# Patient Record
Sex: Male | Born: 1975
Health system: Southern US, Community
[De-identification: ages and names within clinical notes are randomized; demographics above are authoritative.]

## PROBLEM LIST (undated history)

## (undated) DIAGNOSIS — K219 Gastro-esophageal reflux disease without esophagitis: Secondary | ICD-10-CM

## (undated) DIAGNOSIS — I10 Essential (primary) hypertension: Secondary | ICD-10-CM

## (undated) DIAGNOSIS — T7840XA Allergy, unspecified, initial encounter: Secondary | ICD-10-CM

## (undated) DIAGNOSIS — K56609 Unspecified intestinal obstruction, unspecified as to partial versus complete obstruction: Secondary | ICD-10-CM

## (undated) DIAGNOSIS — F419 Anxiety disorder, unspecified: Secondary | ICD-10-CM

## (undated) DIAGNOSIS — K5720 Diverticulitis of large intestine with perforation and abscess without bleeding: Secondary | ICD-10-CM

## (undated) DIAGNOSIS — J45909 Unspecified asthma, uncomplicated: Secondary | ICD-10-CM

## (undated) HISTORY — DX: Essential (primary) hypertension: I10

## (undated) HISTORY — DX: Anxiety disorder, unspecified: F41.9

## (undated) HISTORY — DX: Diverticulitis of large intestine with perforation and abscess without bleeding: K57.20

## (undated) HISTORY — DX: Allergy, unspecified, initial encounter: T78.40XA

## (undated) HISTORY — PX: NO PAST SURGERIES: SHX2092

## (undated) HISTORY — DX: Gastro-esophageal reflux disease without esophagitis: K21.9

---

## 2010-07-03 ENCOUNTER — Emergency Department (HOSPITAL_BASED_OUTPATIENT_CLINIC_OR_DEPARTMENT_OTHER): Admission: EM | Admit: 2010-07-03 | Discharge: 2010-07-03 | Payer: Self-pay | Admitting: Emergency Medicine

## 2010-07-03 ENCOUNTER — Emergency Department (HOSPITAL_COMMUNITY): Admission: EM | Admit: 2010-07-03 | Discharge: 2010-07-03 | Payer: Self-pay | Admitting: Emergency Medicine

## 2010-07-03 ENCOUNTER — Ambulatory Visit: Payer: Self-pay | Admitting: Diagnostic Radiology

## 2012-12-13 ENCOUNTER — Emergency Department (HOSPITAL_BASED_OUTPATIENT_CLINIC_OR_DEPARTMENT_OTHER): Payer: BC Managed Care – PPO

## 2012-12-13 ENCOUNTER — Emergency Department (HOSPITAL_BASED_OUTPATIENT_CLINIC_OR_DEPARTMENT_OTHER)
Admission: EM | Admit: 2012-12-13 | Discharge: 2012-12-14 | Disposition: A | Discharge status: Home / Self Care | Payer: BC Managed Care – PPO | Attending: Emergency Medicine | Admitting: Emergency Medicine

## 2012-12-13 ENCOUNTER — Encounter (HOSPITAL_BASED_OUTPATIENT_CLINIC_OR_DEPARTMENT_OTHER): Payer: Self-pay | Admitting: *Deleted

## 2012-12-13 DIAGNOSIS — S0280XA Fracture of other specified skull and facial bones, unspecified side, initial encounter for closed fracture: Secondary | ICD-10-CM | POA: Insufficient documentation

## 2012-12-13 DIAGNOSIS — IMO0002 Reserved for concepts with insufficient information to code with codable children: Secondary | ICD-10-CM | POA: Insufficient documentation

## 2012-12-13 DIAGNOSIS — H1130 Conjunctival hemorrhage, unspecified eye: Secondary | ICD-10-CM | POA: Insufficient documentation

## 2012-12-13 DIAGNOSIS — S0285XA Fracture of orbit, unspecified, initial encounter for closed fracture: Secondary | ICD-10-CM

## 2012-12-13 DIAGNOSIS — S058X9A Other injuries of unspecified eye and orbit, initial encounter: Secondary | ICD-10-CM | POA: Insufficient documentation

## 2012-12-13 DIAGNOSIS — Y939 Activity, unspecified: Secondary | ICD-10-CM | POA: Insufficient documentation

## 2012-12-13 DIAGNOSIS — Y92009 Unspecified place in unspecified non-institutional (private) residence as the place of occurrence of the external cause: Secondary | ICD-10-CM | POA: Insufficient documentation

## 2012-12-13 MED ORDER — OXYCODONE-ACETAMINOPHEN 5-325 MG PO TABS
1.0000 | ORAL_TABLET | Freq: Once | ORAL | Status: AC
  Administered 2012-12-13: 1 via ORAL
  Filled 2012-12-13 (×2): qty 1

## 2012-12-13 MED ORDER — FLUORESCEIN SODIUM 1 MG OP STRP
1.0000 | ORAL_STRIP | Freq: Once | OPHTHALMIC | Status: AC
  Administered 2012-12-13: via OPHTHALMIC
  Filled 2012-12-13: qty 1
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
## 2012-12-13 NOTE — ED Notes (Signed)
Patient is ready for dermabond, irrigation completed.

## 2012-12-14 MED ORDER — OXYCODONE-ACETAMINOPHEN 5-325 MG PO TABS: 1.0000 | ORAL_TABLET | Freq: Four times a day (QID) | ORAL | Status: DC | PRN

## 2012-12-14 MED ORDER — CLINDAMYCIN HCL 300 MG PO CAPS: 300.0000 mg | ORAL_CAPSULE | Freq: Three times a day (TID) | ORAL | Status: DC

## 2012-12-14 MED ORDER — CLINDAMYCIN HCL 150 MG PO CAPS
300.0000 mg | ORAL_CAPSULE | Freq: Once | ORAL | Status: AC
  Administered 2012-12-14: 300 mg via ORAL
  Filled 2012-12-14: qty 2

## 2012-12-14 NOTE — ED Notes (Signed)
MD at bedside. 

## 2012-12-14 NOTE — ED Notes (Signed)
Returned from CT.

## 2014-01-28 ENCOUNTER — Emergency Department (HOSPITAL_BASED_OUTPATIENT_CLINIC_OR_DEPARTMENT_OTHER)
Admission: EM | Admit: 2014-01-28 | Discharge: 2014-01-28 | Disposition: A | Discharge status: Home / Self Care | Payer: BC Managed Care – PPO | Attending: Emergency Medicine | Admitting: Emergency Medicine

## 2014-01-28 ENCOUNTER — Encounter (HOSPITAL_BASED_OUTPATIENT_CLINIC_OR_DEPARTMENT_OTHER): Payer: Self-pay | Admitting: Emergency Medicine

## 2014-01-28 DIAGNOSIS — R000 Tachycardia, unspecified: Secondary | ICD-10-CM | POA: Insufficient documentation

## 2014-01-28 DIAGNOSIS — M436 Torticollis: Secondary | ICD-10-CM | POA: Insufficient documentation

## 2014-01-28 DIAGNOSIS — R110 Nausea: Secondary | ICD-10-CM | POA: Insufficient documentation

## 2014-01-28 DIAGNOSIS — Z792 Long term (current) use of antibiotics: Secondary | ICD-10-CM | POA: Insufficient documentation

## 2014-01-28 MED ORDER — DEXAMETHASONE 4 MG PO TABS
10.0000 mg | ORAL_TABLET | Freq: Once | ORAL | Status: AC
  Administered 2014-01-28: 10 mg via ORAL

## 2014-01-28 MED ORDER — IBUPROFEN 800 MG PO TABS: 800.0000 mg | ORAL_TABLET | Freq: Three times a day (TID) | ORAL | Status: AC

## 2014-01-28 MED ORDER — DIAZEPAM 5 MG PO TABS
5.0000 mg | ORAL_TABLET | Freq: Once | ORAL | Status: AC
  Administered 2014-01-28: 5 mg via ORAL
  Filled 2014-01-28: qty 1

## 2014-01-28 MED ORDER — HYDROCODONE-ACETAMINOPHEN 5-325 MG PO TABS: 1.0000 | ORAL_TABLET | Freq: Four times a day (QID) | ORAL | Status: DC | PRN

## 2014-01-28 MED ORDER — HYDROCODONE-ACETAMINOPHEN 5-325 MG PO TABS
1.0000 | ORAL_TABLET | Freq: Once | ORAL | Status: AC
  Administered 2014-01-28: 1 via ORAL
  Filled 2014-01-28: qty 1

## 2014-01-28 MED ORDER — DIAZEPAM 5 MG PO TABS: 5.0000 mg | ORAL_TABLET | Freq: Two times a day (BID) | ORAL | Status: DC

## 2014-01-28 MED ORDER — DEXAMETHASONE 4 MG PO TABS
ORAL_TABLET | ORAL | Status: AC
  Filled 2014-01-28: qty 3

## 2014-01-28 NOTE — ED Provider Notes (Signed)
CSN: [id number]     Arrival date & time 01/28/14  0814 History   First MD Initiated Contact with Patient 01/28/14 [phone]     Chief Complaint  Patient presents with  . Torticollis      HPI  Patient presents with concern of pain in the right lateral inferior neck.  Pain began 3 days ago without clear precipitant.  Since onset the pain has been severe, sore, not improved with ibuprofen.  Pain radiates down the right arm, is worse with right lateral head rotation. There is no associated headache, confusion, disorientation, chest pain dyspnea, abdominal pain. Patient does endorse nausea with pain exacerbations.  Patient has no history of trauma, fall.   History reviewed. No pertinent past medical history. History reviewed. No pertinent past surgical history. No family history on file. History  Substance Use Topics  . Smoking status: Never Smoker   . Smokeless tobacco: Not on file  . Alcohol Use: No    Review of Systems  All other systems reviewed and are negative.      Allergies  Review of patient's allergies indicates no known allergies.  Home Medications   Current Outpatient Rx  Name  Route  Sig  Dispense  Refill  . clindamycin (CLEOCIN) 300 MG capsule   Oral   Take 1 capsule (300 mg total) by mouth 3 (three) times daily.   21 capsule   0   . diazepam (VALIUM) 5 MG tablet   Oral   Take 1 tablet (5 mg total) by mouth 2 (two) times daily.   8 tablet   0   . HYDROcodone-acetaminophen (NORCO/VICODIN) 5-325 MG per tablet   Oral   Take 1-2 tablets by mouth every 6 (six) hours as needed for moderate pain.   15 tablet   0   . ibuprofen (ADVIL,MOTRIN) 800 MG tablet   Oral   Take 1 tablet (800 mg total) by mouth 3 (three) times daily.   12 tablet   0   . oxyCODONE-acetaminophen (PERCOCET/ROXICET) 5-325 MG per tablet   Oral   Take 1-2 tablets by mouth every 6 (six) hours as needed for pain.   15 tablet   0    BP 156/101  Pulse 112  Temp(Src) 98.3 F (36.8 C)  (Oral)  Resp 18  Ht 5\' 11"  (1.803 m)  Wt 280 lb (127.007 kg)  BMI 39.07 kg/m2  SpO2 98% Physical Exam  Nursing note and vitals reviewed. Constitutional: He is oriented to person, place, and time. He appears well-developed. No distress.  HENT:  Head: Normocephalic and atraumatic.  Eyes: Conjunctivae and EOM are normal.  Neck:    Cardiovascular: Normal rate and regular rhythm.   Pulmonary/Chest: Effort normal. No stridor. No respiratory distress.  Abdominal: He exhibits no distension.  Musculoskeletal: He exhibits no edema.       Right shoulder: Normal.       Right elbow: Normal.      Right wrist: Normal.  Neurological: He is alert and oriented to person, place, and time. He displays no atrophy and no tremor. No cranial nerve deficit or sensory deficit. He exhibits normal muscle tone. He displays no seizure activity. Coordination and gait normal.  Skin: Skin is warm and dry.  Psychiatric: He has a normal mood and affect.    ED Course  Procedures (including critical care time)  MDM   Final diagnoses:  Torticollis, acute    Patient presents with likely acute radiculopathy.  Patient is mildly tachycardic, but hemodynamically  stable, neurologically intact, and in no distress.  Patient has no risk factors, no notable red flags on exam.  Patient to start him a course of anti-inflammatories, muscle relaxants and discharged in stable condition.    Gerhard Munchobert Woodward Klem, MD 01/28/14 [phone]

## 2014-01-28 NOTE — Discharge Instructions (Signed)
As discussed, your emergency department visit has resulted in a diagnosis of torticollis.  Your pain is likely coming from inflammation of the area near the base of your cervical spine, or neck.  This pain typically improves in a few days with appropriate pain medication use.  If you develop new, or concerning changes in your condition, please be sure return here.

## 2014-01-28 NOTE — ED Notes (Signed)
Neck pain radiating to right shoulder x 4 days without injury.  Symptoms unrelieved after taking Ibuprofen.

## 2014-02-04 ENCOUNTER — Encounter: Payer: Self-pay | Admitting: Family Medicine

## 2014-02-04 ENCOUNTER — Ambulatory Visit (INDEPENDENT_AMBULATORY_CARE_PROVIDER_SITE_OTHER): Payer: BC Managed Care – PPO | Admitting: Family Medicine

## 2014-02-04 VITALS — BP 149/98 | HR 92 | Ht 71.0 in | Wt 269.4 lb

## 2014-02-04 DIAGNOSIS — M542 Cervicalgia: Secondary | ICD-10-CM

## 2014-02-04 DIAGNOSIS — M25519 Pain in unspecified shoulder: Secondary | ICD-10-CM

## 2014-02-04 DIAGNOSIS — M25511 Pain in right shoulder: Secondary | ICD-10-CM

## 2014-02-04 MED ORDER — DIAZEPAM 5 MG PO TABS: 5.0000 mg | ORAL_TABLET | Freq: Three times a day (TID) | ORAL | Status: DC | PRN

## 2014-02-04 MED ORDER — HYDROCODONE-ACETAMINOPHEN 7.5-325 MG PO TABS: ORAL_TABLET | ORAL | Status: DC

## 2014-02-04 MED ORDER — PREDNISONE (PAK) 10 MG PO TABS: ORAL_TABLET | Freq: Every day | ORAL | Status: DC

## 2014-02-04 MED ORDER — ONDANSETRON HCL 4 MG PO TABS: 4.0000 mg | ORAL_TABLET | Freq: Three times a day (TID) | ORAL | Status: DC | PRN

## 2014-02-04 NOTE — Patient Instructions (Signed)
You have severe muscle spasms/strain of your right trapezius. It's possible this is from an irritated nerve though treatment is similar for this. Prednisone 6 day dose pack to relieve irritation/inflammation. Aleve 2 tabs twice a day with food for pain and inflammation - start day AFTER finishing prednisone if prescribed this. Valium as needed for severe spasms (no driving on this).   Norco as needed for severe pain (no driving on this medicine). Zofran as needed for nausea. Consider cervical collar if severely painful. Simple range of motion exercises within limits of pain to prevent further stiffness. Start physical therapy for stretching, exercises, traction, and modalities. Heat or ice (whichever feels better) 15 minutes at a time 3-4 times a day to help with spasms. Watch head position when on computers, texting, when sleeping in bed - should in line with back to prevent further irritation. Consider home traction unit if you get benefit with this in physical therapy. If not improving we will consider an MRI. Call me in a week if you're not improving.

## 2014-02-08 ENCOUNTER — Encounter: Payer: Self-pay | Admitting: Family Medicine

## 2014-02-08 DIAGNOSIS — M542 Cervicalgia: Secondary | ICD-10-CM | POA: Insufficient documentation

## 2014-02-08 NOTE — Progress Notes (Signed)
Patient ID: Alice Riegereter Devries, male   DOB: 08/09/1976, 38 y.o.   MRN: [id number]  PCP: No PCP Per Patient  Subjective:   HPI: Patient is a 38 y.o. male here for right shoulder/neck pain.  Patient denies known injury. States about 2 weeks ago he just developed right sided neck pain that has worsened since then. Associated with nausea when pain is severe. Worse with turning to right side. No radiation. Tried ibuprofen, oxycodone, valium. Has been icing also. No bowel/bladder dysfunction.  No past medical history on file.  No current outpatient prescriptions on file prior to visit.   No current facility-administered medications on file prior to visit.    No past surgical history on file.  No Known Allergies  History   Social History  . Marital Status: Married    Spouse Name: N/A    Number of Children: N/A  . Years of Education: N/A   Occupational History  . Not on file.   Social History Main Topics  . Smoking status: Never Smoker   . Smokeless tobacco: Not on file  . Alcohol Use: No  . Drug Use: Yes    Special: Marijuana     Comment: occasional  . Sexual Activity: Not on file   Other Topics Concern  . Not on file   Social History Narrative  . No narrative on file    No family history on file.  BP 149/98  Pulse 92  Ht 5\' 11"  (1.803 m)  Wt 269 lb 6.4 oz (122.199 kg)  BMI 37.59 kg/m2  Review of Systems: See HPI above.    Objective:  Physical Exam:  Gen: NAD  Neck: No gross deformity, swelling, bruising.  Spasms right trapezius. TTP right cervical parsapinal muscles, trapezius.  No midline/bony TTP. FROM neck - pain on right lateral rotation, extension. BUE strength 5/5.   Sensation intact to light touch.   2+ equal reflexes in triceps, biceps, brachioradialis tendons. Negative spurlings. NV intact distal BUEs.    Assessment & Plan:  1. Right neck pain - severe paraspinal, trapezius spasms.  Possibly due to cervical nerve irritation though rest of  exam benign.  Start with prednisone, valium, norco with zofran as needed for nausea.  Consider collar.  Start physical therapy.  Ergonomic issues discussed.  Consider MRI if not improving.

## 2014-02-08 NOTE — Assessment & Plan Note (Signed)
severe paraspinal, trapezius spasms.  Possibly due to cervical nerve irritation though rest of exam benign.  Start with prednisone, valium, norco with zofran as needed for nausea.  Consider collar.  Start physical therapy.  Ergonomic issues discussed.  Consider MRI if not improving.

## 2014-07-19 ENCOUNTER — Ambulatory Visit (INDEPENDENT_AMBULATORY_CARE_PROVIDER_SITE_OTHER): Payer: BC Managed Care – PPO | Admitting: Family Medicine

## 2014-07-19 ENCOUNTER — Encounter: Payer: Self-pay | Admitting: Family Medicine

## 2014-07-19 VITALS — BP 148/92 | HR 91 | Ht 71.0 in | Wt 275.0 lb

## 2014-07-19 DIAGNOSIS — M542 Cervicalgia: Secondary | ICD-10-CM

## 2014-07-19 MED ORDER — PREDNISONE (PAK) 10 MG PO TABS: ORAL_TABLET | Freq: Every day | ORAL | Status: DC

## 2014-07-19 MED ORDER — DIAZEPAM 5 MG PO TABS: 5.0000 mg | ORAL_TABLET | Freq: Three times a day (TID) | ORAL | Status: DC | PRN

## 2014-07-19 MED ORDER — HYDROCODONE-ACETAMINOPHEN 7.5-325 MG PO TABS: ORAL_TABLET | ORAL | Status: DC

## 2014-07-19 NOTE — Patient Instructions (Signed)
You have severe muscle spasms/strain of your right trapezius. It's possible this is from an irritated nerve though treatment is similar for this. Prednisone 6 day dose pack to relieve irritation/inflammation. Aleve 2 tabs twice a day with food for pain and inflammation - start day AFTER finishing prednisone. Valium as needed for severe spasms (no driving on this).   Norco as needed for severe pain (no driving on this medicine). Zofran as needed for nausea. Consider cervical collar if severely painful. Simple range of motion exercises within limits of pain to prevent further stiffness. Call me in 1-2 weeks if you're not completely better and want to do physical therapy.   Heat or ice (whichever feels better) 15 minutes at a time 3-4 times a day to help with spasms. Watch head position when on computers, texting, when sleeping in bed - should in line with back to prevent further irritation. If not improving we will consider an MRI. Call me in a week if you're not improving otherwise follow up as needed.

## 2014-07-20 ENCOUNTER — Encounter: Payer: Self-pay | Admitting: Family Medicine

## 2014-07-20 NOTE — Assessment & Plan Note (Signed)
severe paraspinal, trapezius spasms similar to back in March.  Possibly due to cervical nerve irritation though rest of exam benign.  Did well with prednisone, valium, norco with zofran as needed for nausea last time - will repeat this.  Call us if he wants to do physical therapy.  If not improving at all would consider cervical spine MRI.  Call us in 1 week with his status.

## 2014-07-20 NOTE — Progress Notes (Signed)
Patient ID: Paul Gilmore, male   DOB: [birth year], 38 y.o.   MRN: [id number]  PCP: No PCP Per Patient  Subjective:   HPI: Patient is a 38 y.o. male here for right shoulder/neck pain.  3/12: Patient denies known injury. States about 2 weeks ago he just developed right sided neck pain that has worsened since then. Associated with nausea when pain is severe. Worse with turning to right side. No radiation. Tried ibuprofen, oxycodone, valium. Has been icing also. No bowel/bladder dysfunction.  8/24: Patient reports he was working in the yard about 1 week ago moving tree limbs. No known injury though felt like he had a pulled muscle especially by the next morning. Pain in same area right side of neck. Has to support elbow to get comfortable. Improved completely following treatment in March and this feels similar (prednisone, valium, norco). Did not do PT since he improved. Pain worse looking to the left. No bowel/bladder dysfunction. No numbness/tingling.  History reviewed. No pertinent past medical history.  Current Outpatient Prescriptions on File Prior to Visit  Medication Sig Dispense Refill  . ondansetron (ZOFRAN) 4 MG tablet Take 1 tablet (4 mg total) by mouth every 8 (eight) hours as needed for nausea or vomiting.  40 tablet  0   No current facility-administered medications on file prior to visit.    History reviewed. No pertinent past surgical history.  No Known Allergies  History   Social History  . Marital Status: Married    Spouse Name: N/A    Number of Children: N/A  . Years of Education: N/A   Occupational History  . Not on file.   Social History Main Topics  . Smoking status: Never Smoker   . Smokeless tobacco: Not on file  . Alcohol Use: No  . Drug Use: Yes    Special: Marijuana     Comment: occasional  . Sexual Activity: Not on file   Other Topics Concern  . Not on file   Social History Narrative  . No narrative on file    No family history  on file.  BP 148/92  Pulse 91  Ht  (1.803 m)  Wt 275 lb (124.739 kg)  BMI 38.37 kg/m2  Review of Systems: See HPI above.    Objective:  Physical Exam:  Gen: NAD  Neck: No gross deformity, swelling, bruising.  Spasms right trapezius. TTP right cervical parsapinal muscles, trapezius.  No midline/bony TTP. FROM neck - pain on left lateral rotation. BUE strength 5/5.   Sensation intact to light touch.   2+ equal reflexes in triceps, biceps, brachioradialis tendons. Negative spurlings. NV intact distal BUEs.    Assessment & Plan:  1. Right neck pain - severe paraspinal, trapezius spasms similar to back in March.  Possibly due to cervical nerve irritation though rest of exam benign.  Did well with prednisone, valium, norco with zofran as needed for nausea last time - will repeat this.  Call us if he wants to do physical therapy.  If not improving at all would consider cervical spine MRI.  Call us in 1 week with his status.

## 2014-11-30 ENCOUNTER — Encounter: Payer: Self-pay | Admitting: Family Medicine

## 2014-11-30 ENCOUNTER — Ambulatory Visit (INDEPENDENT_AMBULATORY_CARE_PROVIDER_SITE_OTHER): Payer: BLUE CROSS/BLUE SHIELD | Admitting: Family Medicine

## 2014-11-30 VITALS — BP 170/99 | HR 92 | Ht 71.0 in | Wt 270.0 lb

## 2014-11-30 DIAGNOSIS — M542 Cervicalgia: Secondary | ICD-10-CM

## 2014-11-30 MED ORDER — HYDROCODONE-ACETAMINOPHEN 7.5-325 MG PO TABS: ORAL_TABLET | ORAL | Status: DC

## 2014-11-30 MED ORDER — DIAZEPAM 5 MG PO TABS: 5.0000 mg | ORAL_TABLET | Freq: Three times a day (TID) | ORAL | Status: DC | PRN

## 2014-11-30 NOTE — Patient Instructions (Signed)
You have severe muscle spasms/strain of your right trapezius. Aleve 2 tabs twice a day with food for pain and inflammation Valium as needed for severe spasms (no driving on this).   Norco as needed for severe pain (no driving on this medicine). Simple range of motion exercises within limits of pain to prevent further stiffness. Call me in 1-2 weeks if not improving and would call in the prednisone.  Heat or ice (whichever feels better) 15 minutes at a time 3-4 times a day to help with spasms. Watch head position when on computers, texting, when sleeping in bed - should in line with back to prevent further spasms. Call me in a week if you're not improving otherwise follow up as needed.

## 2014-12-01 NOTE — Assessment & Plan Note (Signed)
severe paraspinal, trapezius spasms similar to last visit.  Will try to treat without prednisone dose pack this time - take regular nsaids.  Valium and norco as needed.  Call us if over next week he's not improving and would call in prednisone dose pack.  Consider physical therapy as well.

## 2014-12-01 NOTE — Progress Notes (Signed)
Patient ID: Paul Gilmore, male   DOB: 02/20/1976, 39 y.o.   MRN: [id number]  PCP: No PCP Per Patient  Subjective:   HPI: Patient is a 39 y.o. male here for right shoulder/neck pain.  3/12: Patient denies known injury. States about 2 weeks ago he just developed right sided neck pain that has worsened since then. Associated with nausea when pain is severe. Worse with turning to right side. No radiation. Tried ibuprofen, oxycodone, valium. Has been icing also. No bowel/bladder dysfunction.  07/19/14: Patient reports he was working in the yard about 1 week ago moving tree limbs. No known injury though felt like he had a pulled muscle especially by the next morning. Pain in same area right side of neck. Has to support elbow to get comfortable. Improved completely following treatment in March and this feels similar (prednisone, valium, norco). Did not do PT since he improved. Pain worse looking to the left. No bowel/bladder dysfunction. No numbness/tingling.  11/30/14: Patient reports similar issue to last visit. Was working a lot in the yard about a week ago and that day and the next started to develop pain in right side of upper back, neck. No numbness or tingling. No bowel/bladder dysfunction. No radiation into his arm. Again completely improved after last visit with prednisone, valium, norco.  No past medical history on file.  Current Outpatient Prescriptions on File Prior to Visit  Medication Sig Dispense Refill  . ondansetron (ZOFRAN) 4 MG tablet Take 1 tablet (4 mg total) by mouth every 8 (eight) hours as needed for nausea or vomiting. 40 tablet 0   No current facility-administered medications on file prior to visit.    No past surgical history on file.  No Known Allergies  History   Social History  . Marital Status: Married    Spouse Name: N/A    Number of Children: N/A  . Years of Education: N/A   Occupational History  . Not on file.   Social History Main  Topics  . Smoking status: Never Smoker   . Smokeless tobacco: Current User  . Alcohol Use: No  . Drug Use: Yes    Special: Marijuana     Comment: occasional  . Sexual Activity: Not on file   Other Topics Concern  . Not on file   Social History Narrative    No family history on file.  BP 170/99 mmHg  Pulse 92  Ht 5\' 11"  (1.803 m)  Wt 270 lb (122.471 kg)  BMI 37.67 kg/m2  Review of Systems: See HPI above.    Objective:  Physical Exam:  Gen: NAD  Neck: No gross deformity, swelling, bruising.  Spasms right trapezius. TTP right cervical parsapinal muscles, trapezius.  No midline/bony TTP. FROM neck - pain on flexion and extension. BUE strength 5/5.   Sensation intact to light touch.   2+ equal reflexes in triceps, biceps, brachioradialis tendons. Negative spurlings. NV intact distal BUEs.    Assessment & Plan:  1. Right neck pain - severe paraspinal, trapezius spasms similar to last visit.  Will try to treat without prednisone dose pack this time - take regular nsaids.  Valium and norco as needed.  Call us if over next week he's not improving and would call in prednisone dose pack.  Consider physical therapy as well.

## 2016-02-16 ENCOUNTER — Encounter: Payer: Self-pay | Admitting: Family Medicine

## 2016-02-16 ENCOUNTER — Ambulatory Visit (INDEPENDENT_AMBULATORY_CARE_PROVIDER_SITE_OTHER): Payer: BLUE CROSS/BLUE SHIELD | Admitting: Family Medicine

## 2016-02-16 VITALS — BP 137/102 | HR 91 | Ht 71.0 in | Wt 295.0 lb

## 2016-02-16 DIAGNOSIS — M542 Cervicalgia: Secondary | ICD-10-CM | POA: Diagnosis not present

## 2016-02-16 MED ORDER — PREDNISONE 10 MG PO TABS
ORAL_TABLET | ORAL | Status: DC
Start: 2016-02-16 — End: 2016-09-19

## 2016-02-16 MED ORDER — DIAZEPAM 5 MG PO TABS: 5.0000 mg | ORAL_TABLET | Freq: Three times a day (TID) | ORAL | Status: DC | PRN

## 2016-02-16 MED ORDER — HYDROCODONE-ACETAMINOPHEN 7.5-325 MG PO TABS: ORAL_TABLET | ORAL | Status: DC

## 2016-02-16 MED FILL — HYDROCODON-APAP 7.5-325: 7.5-325 | 13 days supply | Qty: 50 | Fill #0

## 2016-02-16 MED FILL — predniSONE 10 MG TABS: 10 | 6 days supply | Qty: 21 | Fill #0

## 2016-02-16 MED FILL — diazePAM 5 MG TABS: 5 | 17 days supply | Qty: 50 | Fill #0

## 2016-02-16 NOTE — Patient Instructions (Signed)
You have severe muscle spasms/strain of your right trapezius. Prednisone dose pack - take as directed for 6 days - day AFTER finishing this you can take 2 aleve twice a day with food. Valium as needed for severe spasms (no driving on this).   Norco as needed for severe pain (no driving on this medicine). Simple range of motion exercises within limits of pain to prevent further stiffness. Call me in 1-2 weeks if not improving and would consider physical therapy or imaging. Heat or ice (whichever feels better) 15 minutes at a time 3-4 times a day to help with spasms. Watch head position when on computers, texting, when sleeping in bed - should in line with back to prevent further spasms. Call me in a week if you're not improving otherwise follow up as needed.

## 2016-02-17 NOTE — Assessment & Plan Note (Signed)
severe paraspinal, trapezius spasms similar to previous visits.  Prednisone dose pack with valium and norco as needed.  Call us in a week with update on his status.  Heat, simple motion exercises, discussed ergonomic issues.  F/u prn otherwise.

## 2016-02-17 NOTE — Progress Notes (Signed)
Patient ID: Paul Gilmore, male   DOB: 07/25/1976, 40 y.o.   MRN: [id number]  PCP: No PCP Per Patient  Subjective:   HPI: Patient is a 40 y.o. male here for right shoulder/neck pain.  3/12: Patient denies known injury. States about 2 weeks ago he just developed right sided neck pain that has worsened since then. Associated with nausea when pain is severe. Worse with turning to right side. No radiation. Tried ibuprofen, oxycodone, valium. Has been icing also. No bowel/bladder dysfunction.  07/19/14: Patient reports he was working in the yard about 1 week ago moving tree limbs. No known injury though felt like he had a pulled muscle especially by the next morning. Pain in same area right side of neck. Has to support elbow to get comfortable. Improved completely following treatment in March and this feels similar (prednisone, valium, norco). Did not do PT since he improved. Pain worse looking to the left. No bowel/bladder dysfunction. No numbness/tingling.  11/30/14: Patient reports similar issue to last visit. Was working a lot in the yard about a week ago and that day and the next started to develop pain in right side of upper back, neck. No numbness or tingling. No bowel/bladder dysfunction. No radiation into his arm. Again completely improved after last visit with prednisone, valium, norco.  02/16/16: Patient reports he was carrying heavy logs on 3/18 and started to get pain right side of neck, upper back again. No numbness or tingling. Feels better to have arm propped up. Pain level is 8/10, sharp. No bowel/bladder dysfunction. No radiation into arm.  No past medical history on file.  Current Outpatient Prescriptions on File Prior to Visit  Medication Sig Dispense Refill  . ondansetron (ZOFRAN) 4 MG tablet Take 1 tablet (4 mg total) by mouth every 8 (eight) hours as needed for nausea or vomiting. 40 tablet 0   No current facility-administered medications on file prior  to visit.    No past surgical history on file.  No Known Allergies  Social History   Social History  . Marital Status: Married    Spouse Name: N/A  . Number of Children: N/A  . Years of Education: N/A   Occupational History  . Not on file.   Social History Main Topics  . Smoking status: Never Smoker   . Smokeless tobacco: Former NeurosurgeonUser  . Alcohol Use: No  . Drug Use: Yes    Special: Marijuana     Comment: occasional  . Sexual Activity: Not on file   Other Topics Concern  . Not on file   Social History Narrative    No family history on file.  BP 137/102 mmHg  Pulse 91  Ht 5\' 11"  (1.803 m)  Wt 295 lb (133.811 kg)  BMI 41.16 kg/m2  Review of Systems: See HPI above.    Objective:  Physical Exam:  Gen: NAD  Neck: No gross deformity, swelling, bruising.  Spasms right trapezius. TTP right cervical parsapinal muscles, trapezius.  No midline/bony TTP. FROM neck - pain on flexion and extension, right lateral rotation. BUE strength 5/5.   Sensation intact to light touch.   2+ equal reflexes in triceps, biceps, brachioradialis tendons. Negative spurlings. NV intact distal BUEs.    Assessment & Plan:  1. Right neck pain - severe paraspinal, trapezius spasms similar to previous visits.  Prednisone dose pack with valium and norco as needed.  Call us in a week with update on his status.  Heat, simple motion exercises, discussed ergonomic  issues.  F/u prn otherwise.

## 2016-06-25 DIAGNOSIS — B070 Plantar wart: Secondary | ICD-10-CM | POA: Diagnosis not present

## 2016-06-25 DIAGNOSIS — M79671 Pain in right foot: Secondary | ICD-10-CM | POA: Diagnosis not present

## 2016-09-19 ENCOUNTER — Encounter: Payer: Self-pay | Admitting: Family Medicine

## 2016-09-19 ENCOUNTER — Ambulatory Visit (INDEPENDENT_AMBULATORY_CARE_PROVIDER_SITE_OTHER): Payer: BLUE CROSS/BLUE SHIELD | Admitting: Family Medicine

## 2016-09-19 DIAGNOSIS — M542 Cervicalgia: Secondary | ICD-10-CM | POA: Diagnosis not present

## 2016-09-19 DIAGNOSIS — S61412A Laceration without foreign body of left hand, initial encounter: Secondary | ICD-10-CM

## 2016-09-19 DIAGNOSIS — S3992XA Unspecified injury of lower back, initial encounter: Secondary | ICD-10-CM | POA: Diagnosis not present

## 2016-09-19 MED ORDER — AMOXICILLIN-POT CLAVULANATE 875-125 MG PO TABS: 1.0000 | ORAL_TABLET | Freq: Two times a day (BID) | ORAL | 0 refills | Status: DC

## 2016-09-19 MED ORDER — HYDROCODONE-ACETAMINOPHEN 7.5-325 MG PO TABS: ORAL_TABLET | ORAL | 0 refills | Status: DC

## 2016-09-19 MED ORDER — DIAZEPAM 5 MG PO TABS: 5.0000 mg | ORAL_TABLET | Freq: Three times a day (TID) | ORAL | 0 refills | Status: DC | PRN

## 2016-09-19 MED ORDER — DICLOFENAC SODIUM 75 MG PO TBEC: 75.0000 mg | DELAYED_RELEASE_TABLET | Freq: Two times a day (BID) | ORAL | 1 refills | Status: DC

## 2016-09-19 MED FILL — diazePAM 5 MG TABS: 5 | 10 days supply | Qty: 30 | Fill #0

## 2016-09-19 MED FILL — HYDROCODON-APAP 7.5-325: 7.5-325 | 10 days supply | Qty: 40 | Fill #0

## 2016-09-19 MED FILL — DICLOFENAC SOD 75 MG TAB EC: 75 | 30 days supply | Qty: 60 | Fill #0

## 2016-09-19 MED FILL — AMOX-CLAV 875-125 MG TABLET: 875-125 | 7 days supply | Qty: 14 | Fill #0

## 2016-09-19 NOTE — Patient Instructions (Signed)
Take the antibiotics twice a day until completely gone (augmentin). You strained your low back and your right trapezius. Diclofenac twice a day with food for pain and inflammation. Valium as needed for severe spasms (no driving on this).   Norco as needed for severe pain (no driving on this medicine). Simple range of motion exercises within limits of pain to prevent further stiffness. Heat or ice (whichever feels better) 15 minutes at a time 3-4 times a day to help with spasms. Follow up with me in 1 week.

## 2016-09-20 ENCOUNTER — Telehealth: Payer: Self-pay | Admitting: Family Medicine

## 2016-09-20 NOTE — Telephone Encounter (Signed)
Spoke to patient and he stated that he needs a letter stating that he can return to full duty with no restrictions on 09-24-16. Stated he would pick up the letter.

## 2016-09-21 NOTE — Telephone Encounter (Signed)
Letter written and up front.

## 2016-09-24 DIAGNOSIS — S3992XA Unspecified injury of lower back, initial encounter: Secondary | ICD-10-CM | POA: Insufficient documentation

## 2016-09-24 DIAGNOSIS — S61419A Laceration without foreign body of unspecified hand, initial encounter: Secondary | ICD-10-CM | POA: Insufficient documentation

## 2016-09-24 NOTE — Progress Notes (Signed)
Patient ID: Paul Gilmore, male   DOB: 12/05/1975, 40 y.o.   MRN: [id number]  PCP: No PCP Per Patient  Subjective:   HPI: Patient is a 40 y.o. male here for low back, neck, hand injury.  3/12: Patient denies known injury. States about 2 weeks ago he just developed right sided neck pain that has worsened since then. Associated with nausea when pain is severe. Worse with turning to right side. No radiation. Tried ibuprofen, oxycodone, valium. Has been icing also. No bowel/bladder dysfunction.  07/19/14: Patient reports he was working in the yard about 1 week ago moving tree limbs. No known injury though felt like he had a pulled muscle especially by the next morning. Pain in same area right side of neck. Has to support elbow to get comfortable. Improved completely following treatment in March and this feels similar (prednisone, valium, norco). Did not do PT since he improved. Pain worse looking to the left. No bowel/bladder dysfunction. No numbness/tingling.  11/30/14: Patient reports similar issue to last visit. Was working a lot in the yard about a week ago and that day and the next started to develop pain in right side of upper back, neck. No numbness or tingling. No bowel/bladder dysfunction. No radiation into his arm. Again completely improved after last visit with prednisone, valium, norco.  02/16/16: Patient reports he was carrying heavy logs on 3/18 and started to get pain right side of neck, upper back again. No numbness or tingling. Feels better to have arm propped up. Pain level is 8/10, sharp. No bowel/bladder dysfunction. No radiation into arm.  10/26: Patient reports he was breaking up a fight between his dogs on 10/24 - when pulling one of the dogs up he felt a pop in his low back. Dog's tooth sliced his left hand in thenar area. Pain in right side of low back is 10/10 level, sharp. Also with pain again right side of neck. No redness, fever, other skin  changes. No bowel/bladder dysfunction. No numbness or tingling. Having nausea related to the pain.  No past medical history on file.  Current Outpatient Prescriptions on File Prior to Visit  Medication Sig Dispense Refill  . ondansetron (ZOFRAN) 4 MG tablet Take 1 tablet (4 mg total) by mouth every 8 (eight) hours as needed for nausea or vomiting. 40 tablet 0   No current facility-administered medications on file prior to visit.     No past surgical history on file.  No Known Allergies  Social History   Social History  . Marital status: Married    Spouse name: N/A  . Number of children: N/A  . Years of education: N/A   Occupational History  . Not on file.   Social History Main Topics  . Smoking status: Never Smoker  . Smokeless tobacco: Current User  . Alcohol use No  . Drug use:     Types: Marijuana     Comment: occasional  . Sexual activity: Not on file   Other Topics Concern  . Not on file   Social History Narrative  . No narrative on file    No family history on file.  BP (!) 144/95   Pulse (!) 111   Ht 5\' 11"  (1.803 m)   Wt 290 lb (131.5 kg)   BMI 40.45 kg/m   Review of Systems: See HPI above.    Objective:  Physical Exam:  Gen: NAD  Neck: No gross deformity, swelling, bruising. TTP right cervical parsapinal muscles, trapezius.  No midline/bony TTP. FROM neck - pain on flexion and right lateral rotation. BUE strength 5/5.   Sensation intact to light touch.   NV intact distal BUEs.  Back: No gross deformity, scoliosis. TTP right lumbar paraspinal region.  No midline or bony TTP. Very limited ROM due to pain.. Strength LEs 5/5 all muscle groups BLEs.   2+ MSRs in patellar and achilles tendons, equal bilaterally. Negative SLRs. Sensation intact to light touch bilaterally. Negative logroll bilateral hips  Left hand: Clean laceration over thenar area.  No purulence, erythema, drainage. No visible tendons in laceration. Able to fully  move thumb in all directions.    Assessment & Plan:  1. Right neck pain - flared right cervical, paraspinal strain again.  Diclofenac with valium and norco as needed.  Simple ROM exercises.  Heat/ice.  F/u in 1 week.  2. Low back pain - consistent with lumbar strain.  No red flag symptoms.  Diclofenac with valium and norco as needed.  Reviewed basic exercises for low back.  Heat/ice.  F/u in 1 week.  3.  Laceration of left hand - due to dog bite.  Advised against suturing this - flushed today with copious amounts of sterile saline.  Butterfly applied.  Stressed importance of taking all of augmentin prescribed today.  F/u in 1 week.  Call us if any redness, fever, purulence.

## 2016-09-24 NOTE — Assessment & Plan Note (Signed)
Laceration of left hand - due to dog bite.  Advised against suturing this - flushed today with copious amounts of sterile saline.  Butterfly applied.  Stressed importance of taking all of augmentin prescribed today.  F/u in 1 week.  Call us if any redness, fever, purulence.

## 2016-09-24 NOTE — Assessment & Plan Note (Signed)
consistent with lumbar strain.  No red flag symptoms.  Diclofenac with valium and norco as needed.  Reviewed basic exercises for low back.  Heat/ice.  F/u in 1 week.

## 2016-09-24 NOTE — Assessment & Plan Note (Signed)
flared right cervical, paraspinal strain again.  Diclofenac with valium and norco as needed.  Simple ROM exercises.  Heat/ice.  F/u in 1 week.

## 2016-09-26 ENCOUNTER — Ambulatory Visit (INDEPENDENT_AMBULATORY_CARE_PROVIDER_SITE_OTHER): Payer: BLUE CROSS/BLUE SHIELD | Admitting: Family Medicine

## 2016-09-26 ENCOUNTER — Telehealth: Payer: Self-pay | Admitting: Family Medicine

## 2016-09-26 ENCOUNTER — Ambulatory Visit: Payer: BLUE CROSS/BLUE SHIELD | Admitting: Family Medicine

## 2016-09-26 ENCOUNTER — Ambulatory Visit (HOSPITAL_BASED_OUTPATIENT_CLINIC_OR_DEPARTMENT_OTHER)
Admission: RE | Admit: 2016-09-26 | Discharge: 2016-09-26 | Disposition: A | Discharge status: Home / Self Care | Payer: BLUE CROSS/BLUE SHIELD | Source: Ambulatory Visit | Attending: Family Medicine | Admitting: Family Medicine

## 2016-09-26 ENCOUNTER — Encounter: Payer: Self-pay | Admitting: Family Medicine

## 2016-09-26 VITALS — BP 132/95 | HR 100 | Ht 71.0 in | Wt 300.0 lb

## 2016-09-26 DIAGNOSIS — M4854XA Collapsed vertebra, not elsewhere classified, thoracic region, initial encounter for fracture: Secondary | ICD-10-CM | POA: Insufficient documentation

## 2016-09-26 DIAGNOSIS — M545 Low back pain, unspecified: Secondary | ICD-10-CM

## 2016-09-26 DIAGNOSIS — S61412D Laceration without foreign body of left hand, subsequent encounter: Secondary | ICD-10-CM | POA: Diagnosis not present

## 2016-09-26 DIAGNOSIS — S3992XD Unspecified injury of lower back, subsequent encounter: Secondary | ICD-10-CM

## 2016-09-26 DIAGNOSIS — M47896 Other spondylosis, lumbar region: Secondary | ICD-10-CM | POA: Diagnosis not present

## 2016-09-26 MED ORDER — PREDNISONE 10 MG PO TABS: ORAL_TABLET | ORAL | 0 refills | Status: DC

## 2016-09-26 MED ORDER — OXYCODONE HCL 5 MG PO TABS: 5.0000 mg | ORAL_TABLET | Freq: Four times a day (QID) | ORAL | 0 refills | Status: DC | PRN

## 2016-09-26 MED FILL — oxyCODONE HCL 5 MG TABS: 5 | 10 days supply | Qty: 40 | Fill #0

## 2016-09-26 MED FILL — predniSONE 10 MG TABS: 10 | 6 days supply | Qty: 21 | Fill #0

## 2016-09-26 NOTE — Patient Instructions (Signed)
Get x-rays downstairs as you leave today. Finish the antibiotic if you still have some of this. Take prednisone dose pack as directed. Stop the diclofenac until you finish the prednisone. Oxycodone as needed for severe pain (no driving on this medicine). Simple range of motion exercises within limits of pain to prevent further stiffness. Heat or ice (whichever feels better) 15 minutes at a time 3-4 times a day to help with spasms. Call me in 1 week to let me know how you're doing - if improving I would add physical therapy.  If not improving would do an MRI then.

## 2016-09-27 ENCOUNTER — Telehealth: Payer: Self-pay | Admitting: Family Medicine

## 2016-09-27 NOTE — Assessment & Plan Note (Signed)
due to dog bite.  Healing well.  Finish antibiotics.

## 2016-09-27 NOTE — Telephone Encounter (Signed)
Spoke with patient about his x-rays.  They show a compression fracture at T12.  I doubt this is the cause of his pain for a couple reasons - this is above where he is hurting and he has no risk factors for osteoporosis, is young.  We did discuss if this truly was present and the cause of his pain it would not alter our management - I would recommend against vertebroplasty/kyphoplasty.  MRI would show us if this was acute or old but, again, would not change how we would treat so would advised against for now.  I still suspect lumbar strain or disc herniation.  We will see him in a week to reassess.  Would consider MRI at that point if not improving.

## 2016-09-27 NOTE — Progress Notes (Addendum)
Patient ID: Paul Gilmore, male   DOB: 09/18/1976, 40 y.o.   MRN: [id number]  PCP: No PCP Per Patient  Subjective:   HPI: Patient is a 40 y.o. male here for low back, neck, hand injury.  3/12: Patient denies known injury. States about 2 weeks ago he just developed right sided neck pain that has worsened since then. Associated with nausea when pain is severe. Worse with turning to right side. No radiation. Tried ibuprofen, oxycodone, valium. Has been icing also. No bowel/bladder dysfunction.  07/19/14: Patient reports he was working in the yard about 1 week ago moving tree limbs. No known injury though felt like he had a pulled muscle especially by the next morning. Pain in same area right side of neck. Has to support elbow to get comfortable. Improved completely following treatment in March and this feels similar (prednisone, valium, norco). Did not do PT since he improved. Pain worse looking to the left. No bowel/bladder dysfunction. No numbness/tingling.  11/30/14: Patient reports similar issue to last visit. Was working a lot in the yard about a week ago and that day and the next started to develop pain in right side of upper back, neck. No numbness or tingling. No bowel/bladder dysfunction. No radiation into his arm. Again completely improved after last visit with prednisone, valium, norco.  02/16/16: Patient reports he was carrying heavy logs on 3/18 and started to get pain right side of neck, upper back again. No numbness or tingling. Feels better to have arm propped up. Pain level is 8/10, sharp. No bowel/bladder dysfunction. No radiation into arm.  10/25: Patient reports he was breaking up a fight between his dogs on 10/24 - when pulling one of the dogs up he felt a pop in his low back. Dog's tooth sliced his left hand in thenar area. Pain in right side of low back is 10/10 level, sharp. Also with pain again right side of neck. No redness, fever, other skin  changes. No bowel/bladder dysfunction. No numbness or tingling. Having nausea related to the pain.  11/1: Patient reports his pain has slightly improved to 8/10 level. Still sharp. Pain worse with walking. Difficulty getting comfortable at night. Taking diclofenac, pain medication, antibiotic. No radiation into legs. No numbness/tingling. No bowel/bladder dysfunction. Cut has improved, no redness, no fever or chills.  No past medical history on file.  Current Outpatient Prescriptions on File Prior to Visit  Medication Sig Dispense Refill  . amoxicillin-clavulanate (AUGMENTIN) 875-125 MG tablet Take 1 tablet by mouth 2 (two) times daily. 14 tablet 0  . diazepam (VALIUM) 5 MG tablet Take 1 tablet (5 mg total) by mouth every 8 (eight) hours as needed for muscle spasms. 30 tablet 0  . diclofenac (VOLTAREN) 75 MG EC tablet Take 1 tablet (75 mg total) by mouth 2 (two) times daily. 60 tablet 1  . ondansetron (ZOFRAN) 4 MG tablet Take 1 tablet (4 mg total) by mouth every 8 (eight) hours as needed for nausea or vomiting. 40 tablet 0   No current facility-administered medications on file prior to visit.     No past surgical history on file.  No Known Allergies  Social History   Social History  . Marital status: Married    Spouse name: N/A  . Number of children: N/A  . Years of education: N/A   Occupational History  . Not on file.   Social History Main Topics  . Smoking status: Never Smoker  . Smokeless tobacco: Current User  .  Alcohol use No  . Drug use:     Types: Marijuana     Comment: occasional  . Sexual activity: Not on file   Other Topics Concern  . Not on file   Social History Narrative  . No narrative on file    No family history on file.  BP (!) 132/95   Pulse 100   Ht 5\' 11"  (1.803 m)   Wt 300 lb (136.1 kg)   BMI 41.84 kg/m   Review of Systems: See HPI above.    Objective:  Physical Exam:  Gen: NAD  Back: No gross deformity, scoliosis. TTP  bilateral lumbar paraspinal regions.  No midline or bony TTP. Very limited ROM due to pain.. Strength LEs 5/5 all muscle groups BLEs.   2+ MSRs in patellar and achilles tendons, equal bilaterally. Negative SLRs. Sensation intact to light touch bilaterally. Negative logroll bilateral hips  Left hand: Clean laceration over thenar area healing well.  No purulence, erythema, drainage. No visible tendons in laceration. Able to fully move thumb in all directions.    Assessment & Plan:  1. Low back pain - very minimal improvement since visit a week ago with diclofenac, valium, norco.  Will go ahead with radiographs today.  As laceration is healing well and no signs of infection will now try prednisone - switch to oxycodone without tylenol for pain.  Heat/ice.  Call us in 1 week.  2. Laceration of left hand - due to dog bite.  Healing well.  Finish antibiotics.    Addendum:  MRI reviewed and discussed with patient's wife.  He did sustain an acute T12 compression fracture - no other findings that would account for his pain.  We discussed pain management.  He will be flying tomorrow because his uncle abruptly passed away - will be in [place] with family for 9 days.  Will fill vicoprofen for him - wife is rationing it to patient so he doesn't take more than prescribed.  We discussed risks of addiction with this medication.  Also recommended DEXA in the future.  We discussed kyphoplasty/vertebroplasty and recommended against this except as last resort.  Will see him back for an office visit in 4 weeks as well.

## 2016-09-27 NOTE — Assessment & Plan Note (Signed)
very minimal improvement since visit a week ago with diclofenac, valium, norco.  Will go ahead with radiographs today.  As laceration is healing well and no signs of infection will now try prednisone - switch to oxycodone without tylenol for pain.  Heat/ice.  Call us in 1 week.

## 2016-09-27 NOTE — Telephone Encounter (Signed)
I spoke with patient - also asked him to fill out a DPR when he comes in for [place] so I can discuss his care with her in the future.

## 2016-10-01 ENCOUNTER — Telehealth: Payer: Self-pay | Admitting: Family Medicine

## 2016-10-01 MED ORDER — HYDROCODONE-IBUPROFEN 7.5-200 MG PO TABS: 1.0000 | ORAL_TABLET | Freq: Four times a day (QID) | ORAL | 0 refills | Status: DC | PRN

## 2016-10-01 MED ORDER — ONDANSETRON HCL 4 MG PO TABS: 4.0000 mg | ORAL_TABLET | Freq: Four times a day (QID) | ORAL | 0 refills | Status: DC | PRN

## 2016-10-01 MED FILL — HYDROCOD-IBU 7.5-200 TAB: 7.5-200 | 10 days supply | Qty: 40 | Fill #0

## 2016-10-01 NOTE — Addendum Note (Signed)
Addended by: Lenda KelpHUDNALL, Jameis Newsham R on: 10/01/2016 02:22 PM   Modules accepted: Orders

## 2016-10-01 NOTE — Addendum Note (Signed)
Addended by: Kathi SimpersWISE, Sherron Mapp F on: 10/01/2016 02:39 PM   Modules accepted: Orders

## 2016-10-01 NOTE — Telephone Encounter (Signed)
Spoke with patient's spouse - patient nauseous despite ondansetron on the oxycodone.  Expressed my concerns re: tylenol in hydrocodone/apap.  He has been tearful at night which is out of character for him.  We will go ahead with MRI of his lumbar spine to further assess for large disc herniation and to assess of T12 compression fracture is acute.  We can try vicoprofen - stressed importance of not taking more than prescribed.  Checked [place] narcotic database prior to writing for this.

## 2016-10-01 NOTE — Telephone Encounter (Signed)
Patient stopped by and filled out a DPR to be able to speak to wife as well.  I would not provide hydrocodone as it has tylenol - he is taking more pain medication than is prescribed (today stated he only had 1-2 more days worth of oxycodone when it should last out to 11/11).  Given option of tramadol or anti-emetic - he chose to do the anti-emetic.

## 2016-10-02 DIAGNOSIS — M5136 Other intervertebral disc degeneration, lumbar region: Secondary | ICD-10-CM | POA: Diagnosis not present

## 2016-10-02 DIAGNOSIS — M4854XA Collapsed vertebra, not elsewhere classified, thoracic region, initial encounter for fracture: Secondary | ICD-10-CM | POA: Diagnosis not present

## 2016-10-02 DIAGNOSIS — M5137 Other intervertebral disc degeneration, lumbosacral region: Secondary | ICD-10-CM | POA: Diagnosis not present

## 2016-10-04 ENCOUNTER — Telehealth: Payer: Self-pay | Admitting: Family Medicine

## 2016-10-04 MED ORDER — HYDROCODONE-IBUPROFEN 7.5-200 MG PO TABS: 1.0000 | ORAL_TABLET | Freq: Four times a day (QID) | ORAL | 0 refills | Status: DC | PRN

## 2016-10-04 NOTE — Telephone Encounter (Signed)
Ok, I let her know, thanks. °

## 2016-10-04 NOTE — Telephone Encounter (Signed)
Spoke with patient's wife (see addendum to last office note).  He is going to [place] for 9 days for a funeral with her (his uncle passed away abruptly) so will refill pain medicine early once.

## 2016-10-04 NOTE — Telephone Encounter (Signed)
I received them but probably won't be able to call her back until the end of the day.

## 2016-10-09 ENCOUNTER — Telehealth: Payer: Self-pay | Admitting: Family Medicine

## 2016-10-09 NOTE — Telephone Encounter (Signed)
I had discussed this with his wife - essentially what I wrote was he's out for 6 weeks, may be up to 12 weeks - it depends on how his symptoms are.

## 2016-10-12 ENCOUNTER — Telehealth: Payer: Self-pay | Admitting: Family Medicine

## 2016-10-15 ENCOUNTER — Ambulatory Visit (INDEPENDENT_AMBULATORY_CARE_PROVIDER_SITE_OTHER): Payer: BLUE CROSS/BLUE SHIELD | Admitting: Family Medicine

## 2016-10-15 ENCOUNTER — Encounter: Payer: Self-pay | Admitting: Family Medicine

## 2016-10-15 DIAGNOSIS — S3992XD Unspecified injury of lower back, subsequent encounter: Secondary | ICD-10-CM | POA: Diagnosis not present

## 2016-10-15 NOTE — Patient Instructions (Signed)
Diclofenac twice a day with food for pain and inflammation. Vicoprofen as needed for severe pain. Heat or ice (whichever feels better) 15 minutes at a time 3-4 times a day to help with spasms. Follow up with me in 4 weeks (can call me sooner for an appointment if you feel much better and that you can return to work - this is dictated by your symptoms).

## 2016-10-15 NOTE — Telephone Encounter (Signed)
Paul Gilmore - you may have to clarify with patient what date he started to be out of work.  And if [facility] need to [facility] we can fill out a new set.  He tried to go back to work after we saw him initially and did so for a few days.

## 2016-10-15 NOTE — Telephone Encounter (Signed)
Spoke to patient and he stated that he worked from 09-24-16 to 09-28-16. His last day of work was on 09-28-16.

## 2016-10-15 NOTE — Telephone Encounter (Signed)
Ok Thanks.  Can you provide that information to Melissa with [facility] [employer]?  And advise her we can fix any paperwork if necessary for her.

## 2016-10-16 ENCOUNTER — Telehealth: Payer: Self-pay | Admitting: Family Medicine

## 2016-10-18 NOTE — Assessment & Plan Note (Signed)
2/2 T12 compression fracture confirmed on MRI to be acute despite pain being more caudal.  Again stressed taking pain medication only as directed.  Can take diclofenac in addition to this twice a day, tylenol but also reviewed not to exceed 4000mg  tylenol, current dosages of diclofenac and vicoprofen.  Heat/ice if needed.  F/u in 4 weeks.  He will need DEXA in future.

## 2016-10-18 NOTE — Progress Notes (Signed)
Patient ID: Paul Gilmore, male   DOB: 06/29/1976, 40 y.o.   MRN: [id number]  PCP: No PCP Per Patient  Subjective:   HPI: Patient is a [age] y.o. male here for low back, neck, hand injury.  3/12: Patient denies known injury. States about 2 weeks ago he just developed right sided neck pain that has worsened since then. Associated with nausea when pain is severe. Worse with turning to right side. No radiation. Tried ibuprofen, oxycodone, valium. Has been icing also. No bowel/bladder dysfunction.  07/19/14: Patient reports he was working in the yard about 1 week ago moving tree limbs. No known injury though felt like he had a pulled muscle especially by the next morning. Pain in same area right side of neck. Has to support elbow to get comfortable. Improved completely following treatment in March and this feels similar (prednisone, valium, norco). Did not do PT since he improved. Pain worse looking to the left. No bowel/bladder dysfunction. No numbness/tingling.  11/30/14: Patient reports similar issue to last visit. Was working a lot in the yard about a week ago and that day and the next started to develop pain in right side of upper back, neck. No numbness or tingling. No bowel/bladder dysfunction. No radiation into his arm. Again completely improved after last visit with prednisone, valium, norco.  02/16/16: Patient reports he was carrying heavy logs on 3/18 and started to get pain right side of neck, upper back again. No numbness or tingling. Feels better to have arm propped up. Pain level is 8/10, sharp. No bowel/bladder dysfunction. No radiation into arm.  10/25: Patient reports he was breaking up a fight between his dogs on 10/24 - when pulling one of the dogs up he felt a pop in his low back. Dog's tooth sliced his left hand in thenar area. Pain in right side of low back is 10/10 level, sharp. Also with pain again right side of neck. No redness, fever, other skin  changes. No bowel/bladder dysfunction. No numbness or tingling. Having nausea related to the pain.  11/1: Patient reports his pain has slightly improved to 8/10 level. Still sharp. Pain worse with walking. Difficulty getting comfortable at night. Taking diclofenac, pain medication, antibiotic. No radiation into legs. No numbness/tingling. No bowel/bladder dysfunction. Cut has improved, no redness, no fever or chills.  11/20: Patient reports he has had some improvement since last visit. Pain down to 7/10 sharp, midline low back. No radiation into legs. Worse first thing in the morning. Taking vicoprofen as needed for severe pain. Finished prednisone dose pack. No numbness or tingling.  No past medical history on file.  Current Outpatient Prescriptions on File Prior to Visit  Medication Sig Dispense Refill  . amoxicillin-clavulanate (AUGMENTIN) 875-125 MG tablet Take 1 tablet by mouth 2 (two) times daily. 14 tablet 0  . diazepam (VALIUM) 5 MG tablet Take 1 tablet (5 mg total) by mouth every 8 (eight) hours as needed for muscle spasms. 30 tablet 0  . diclofenac (VOLTAREN) 75 MG EC tablet Take 1 tablet (75 mg total) by mouth 2 (two) times daily. 60 tablet 1  . HYDROcodone-ibuprofen (VICOPROFEN) 7.5-200 MG tablet Take 1 tablet by mouth every 6 (six) hours as needed for moderate pain. 60 tablet 0  . ondansetron (ZOFRAN) 4 MG tablet Take 1 tablet (4 mg total) by mouth every 6 (six) hours as needed for nausea or vomiting. 40 tablet 0  . oxyCODONE (ROXICODONE) 5 MG immediate release tablet Take 1 tablet (5 mg total)  by mouth every 6 (six) hours as needed for severe pain. 40 tablet 0  . predniSONE (DELTASONE) 10 MG tablet 6 tabs po day 1, 5 tabs po day 2, 4 tabs po day 3, 3 tabs po day 4, 2 tabs po day 5, 1 tab po day 6 21 tablet 0   No current facility-administered medications on file prior to visit.     No past surgical history on file.  No Known Allergies  Social History    Social History  . Marital status: Married    Spouse name: N/A  . Number of children: N/A  . Years of education: N/A   Occupational History  . Not on file.   Social History Main Topics  . Smoking status: Never Smoker  . Smokeless tobacco: Current User  . Alcohol use No  . Drug use:     Types: Marijuana     Comment: occasional  . Sexual activity: Not on file   Other Topics Concern  . Not on file   Social History Narrative  . No narrative on file    No family history on file.  BP (!) 155/107   Pulse 86   Ht 5\' 11"  (1.803 m)   Wt (!) 305 lb (138.3 kg)   BMI 42.54 kg/m   Review of Systems: See HPI above.    Objective:  Physical Exam:  Gen: NAD  Back: No gross deformity, scoliosis. TTP bilateral lumbar paraspinal regions, midline tenderness more L2 region. Very limited ROM due to pain.. Strength LEs 5/5 all muscle groups BLEs.   2+ MSRs in patellar and achilles tendons, equal bilaterally. Negative SLRs. Sensation intact to light touch bilaterally. Negative logroll bilateral hips  Assessment & Plan:  1. Low back pain - 2/2 T12 compression fracture confirmed on MRI to be acute despite pain being more caudal.  Again stressed taking pain medication only as directed.  Can take diclofenac in addition to this twice a day, tylenol but also reviewed not to exceed 4000mg  tylenol, current dosages of diclofenac and vicoprofen.  Heat/ice if needed.  F/u in 4 weeks.  He will need DEXA in future.

## 2016-10-26 ENCOUNTER — Encounter: Payer: Self-pay | Admitting: Family Medicine

## 2016-10-26 ENCOUNTER — Ambulatory Visit (INDEPENDENT_AMBULATORY_CARE_PROVIDER_SITE_OTHER): Payer: BLUE CROSS/BLUE SHIELD | Admitting: Family Medicine

## 2016-10-26 DIAGNOSIS — S3992XD Unspecified injury of lower back, subsequent encounter: Secondary | ICD-10-CM | POA: Diagnosis not present

## 2016-10-26 MED ORDER — HYDROCODONE-IBUPROFEN 7.5-200 MG PO TABS: 1.0000 | ORAL_TABLET | Freq: Four times a day (QID) | ORAL | 0 refills | Status: DC | PRN

## 2016-10-26 MED FILL — HYDROCOD-IBU 7.5-200 TAB: 7.5-200 | 10 days supply | Qty: 40 | Fill #0

## 2016-10-26 NOTE — Patient Instructions (Signed)
Vicoprofen as needed for severe pain - do not drive or work on this medication. Heat or ice (whichever feels better) 15 minutes at a time 3-4 times a day to help with spasms. Call me in 6 weeks to let me know how you're doing and to discuss doing the  bone density test with follow up with an endocrinologist pending those results.

## 2016-10-30 NOTE — Progress Notes (Signed)
Patient ID: Delila Spenceeter A Hall, male   DOB: 01/19/1976, 40 y.o.   MRN: [id number]  PCP: No PCP Per Patient  Subjective:   HPI: Patient is a 40 y.o. male here for low back, neck, hand injury.  3/12: Patient denies known injury. States about 2 weeks ago he just developed right sided neck pain that has worsened since then. Associated with nausea when pain is severe. Worse with turning to right side. No radiation. Tried ibuprofen, oxycodone, valium. Has been icing also. No bowel/bladder dysfunction.  07/19/14: Patient reports he was working in the yard about 1 week ago moving tree limbs. No known injury though felt like he had a pulled muscle especially by the next morning. Pain in same area right side of neck. Has to support elbow to get comfortable. Improved completely following treatment in March and this feels similar (prednisone, valium, norco). Did not do PT since he improved. Pain worse looking to the left. No bowel/bladder dysfunction. No numbness/tingling.  11/30/14: Patient reports similar issue to last visit. Was working a lot in the yard about a week ago and that day and the next started to develop pain in right side of upper back, neck. No numbness or tingling. No bowel/bladder dysfunction. No radiation into his arm. Again completely improved after last visit with prednisone, valium, norco.  02/16/16: Patient reports he was carrying heavy logs on 3/18 and started to get pain right side of neck, upper back again. No numbness or tingling. Feels better to have arm propped up. Pain level is 8/10, sharp. No bowel/bladder dysfunction. No radiation into arm.  10/25: Patient reports he was breaking up a fight between his dogs on 10/24 - when pulling one of the dogs up he felt a pop in his low back. Dog's tooth sliced his left hand in thenar area. Pain in right side of low back is 10/10 level, sharp. Also with pain again right side of neck. No redness, fever, other skin  changes. No bowel/bladder dysfunction. No numbness or tingling. Having nausea related to the pain.  11/1: Patient reports his pain has slightly improved to 8/10 level. Still sharp. Pain worse with walking. Difficulty getting comfortable at night. Taking diclofenac, pain medication, antibiotic. No radiation into legs. No numbness/tingling. No bowel/bladder dysfunction. Cut has improved, no redness, no fever or chills.  11/20: Patient reports he has had some improvement since last visit. Pain down to 7/10 sharp, midline low back. No radiation into legs. Worse first thing in the morning. Taking vicoprofen as needed for severe pain. Finished prednisone dose pack. No numbness or tingling.  12/1: Patient reports he has improved since last visit. Pain is down to 6/10, midline. Pain worse at nighttime, better during day now. Taking vicoprofen. Would like to return to work soon. No radiation into legs. No bowel/bladder dysfunction. No numbness/tingling.  No past medical history on file.  No current outpatient prescriptions on file prior to visit.   No current facility-administered medications on file prior to visit.     No past surgical history on file.  No Known Allergies  Social History   Social History  . Marital status: Married    Spouse name: N/A  . Number of children: N/A  . Years of education: N/A   Occupational History  . Not on file.   Social History Main Topics  . Smoking status: Never Smoker  . Smokeless tobacco: Current User  . Alcohol use No  . Drug use:     Types: Marijuana  Comment: occasional  . Sexual activity: Not on file   Other Topics Concern  . Not on file   Social History Narrative  . No narrative on file    No family history on file.  BP (!) 165/106   Pulse (!) 103   Ht 5\' 11"  (1.803 m)   Wt 300 lb (136.1 kg)   BMI 41.84 kg/m   Review of Systems: See HPI above.    Objective:  Physical Exam:  Gen: NAD  Back: No  gross deformity, scoliosis. TTP mildly bilateral lumbar paraspinal regions, midline. Mod limitation flexion and extension - did not test fully extents of motion. Strength LEs 5/5 all muscle groups BLEs.   2+ MSRs in patellar and achilles tendons, equal bilaterally. Negative SLRs. Sensation intact to light touch bilaterally. Negative logroll bilateral hips  Assessment & Plan:  1. Low back pain - 2/2 T12 compression fracture confirmed on MRI.  Vicoprofen only if needed at this point.  Heat/ice.  Note written to return to work 12/11.  F/u in 6 weeks.  Will need DEXA in future, probable endocrinology referral.

## 2016-10-30 NOTE — Assessment & Plan Note (Signed)
2/2 T12 compression fracture confirmed on MRI.  Vicoprofen only if needed at this point.  Heat/ice.  Note written to return to work 12/11.  F/u in 6 weeks.  Will need DEXA in future, probable endocrinology referral.

## 2016-11-12 ENCOUNTER — Ambulatory Visit: Payer: BLUE CROSS/BLUE SHIELD | Admitting: Family Medicine

## 2016-12-27 NOTE — Telephone Encounter (Signed)
Finished

## 2017-02-06 ENCOUNTER — Encounter (HOSPITAL_BASED_OUTPATIENT_CLINIC_OR_DEPARTMENT_OTHER): Payer: Self-pay

## 2017-02-06 ENCOUNTER — Emergency Department (HOSPITAL_BASED_OUTPATIENT_CLINIC_OR_DEPARTMENT_OTHER): Payer: BLUE CROSS/BLUE SHIELD

## 2017-02-06 ENCOUNTER — Emergency Department (HOSPITAL_BASED_OUTPATIENT_CLINIC_OR_DEPARTMENT_OTHER)
Admission: EM | Admit: 2017-02-06 | Discharge: 2017-02-06 | Disposition: A | Discharge status: Home / Self Care | Payer: BLUE CROSS/BLUE SHIELD | Attending: Emergency Medicine | Admitting: Emergency Medicine

## 2017-02-06 DIAGNOSIS — X58XXXA Exposure to other specified factors, initial encounter: Secondary | ICD-10-CM | POA: Diagnosis not present

## 2017-02-06 DIAGNOSIS — Y939 Activity, unspecified: Secondary | ICD-10-CM | POA: Diagnosis not present

## 2017-02-06 DIAGNOSIS — R05 Cough: Secondary | ICD-10-CM | POA: Diagnosis not present

## 2017-02-06 DIAGNOSIS — Z0389 Encounter for observation for other suspected diseases and conditions ruled out: Secondary | ICD-10-CM | POA: Diagnosis not present

## 2017-02-06 DIAGNOSIS — Z791 Long term (current) use of non-steroidal anti-inflammatories (NSAID): Secondary | ICD-10-CM | POA: Diagnosis not present

## 2017-02-06 DIAGNOSIS — Y929 Unspecified place or not applicable: Secondary | ICD-10-CM | POA: Insufficient documentation

## 2017-02-06 DIAGNOSIS — Y999 Unspecified external cause status: Secondary | ICD-10-CM | POA: Diagnosis not present

## 2017-02-06 DIAGNOSIS — J40 Bronchitis, not specified as acute or chronic: Secondary | ICD-10-CM | POA: Insufficient documentation

## 2017-02-06 DIAGNOSIS — R0602 Shortness of breath: Secondary | ICD-10-CM | POA: Diagnosis not present

## 2017-02-06 DIAGNOSIS — S29011A Strain of muscle and tendon of front wall of thorax, initial encounter: Secondary | ICD-10-CM | POA: Insufficient documentation

## 2017-02-06 MED ORDER — IPRATROPIUM-ALBUTEROL 0.5-2.5 (3) MG/3ML IN SOLN
3.0000 mL | Freq: Once | RESPIRATORY_TRACT | Status: AC
  Administered 2017-02-06: 3 mL via RESPIRATORY_TRACT
  Filled 2017-02-06: qty 3

## 2017-02-06 MED ORDER — ALBUTEROL SULFATE HFA 108 (90 BASE) MCG/ACT IN AERS
2.0000 | INHALATION_SPRAY | Freq: Once | RESPIRATORY_TRACT | Status: AC
  Administered 2017-02-06: 2 via RESPIRATORY_TRACT
  Filled 2017-02-06: qty 6.7

## 2017-02-06 MED ORDER — ALBUTEROL SULFATE (2.5 MG/3ML) 0.083% IN NEBU
2.5000 mg | INHALATION_SOLUTION | Freq: Once | RESPIRATORY_TRACT | Status: AC
Start: 2017-02-06 — End: 2017-02-06
  Administered 2017-02-06: 2.5 mg via RESPIRATORY_TRACT
  Filled 2017-02-06: qty 3

## 2017-02-06 MED ORDER — ALBUTEROL SULFATE (2.5 MG/3ML) 0.083% IN NEBU
INHALATION_SOLUTION | RESPIRATORY_TRACT | Status: AC
  Administered 2017-02-06: 2.5 mg
  Filled 2017-02-06: qty 3

## 2017-02-06 MED ORDER — BENZONATATE 100 MG PO CAPS: 100.0000 mg | ORAL_CAPSULE | Freq: Three times a day (TID) | ORAL | 0 refills | Status: DC | PRN

## 2017-02-06 MED ORDER — ALBUTEROL SULFATE (2.5 MG/3ML) 0.083% IN NEBU
2.5000 mg | INHALATION_SOLUTION | Freq: Once | RESPIRATORY_TRACT | Status: AC
  Administered 2017-02-06: 2.5 mg via RESPIRATORY_TRACT
  Filled 2017-02-06: qty 3

## 2017-02-06 MED FILL — BENZONATATE 100 MG CAP: 100 | 7 days supply | Qty: 21 | Fill #0

## 2017-02-06 NOTE — ED Provider Notes (Signed)
[facility]   CSN: [id number] Arrival date & time: 02/06/17  [id number]     History   Chief Complaint Chief Complaint  Patient presents with  . Shortness of Breath    HPI Paul Gilmore is a 41 y.o. male.  HPI  41 year old male presents with cough and wheezing and shortness of breath. He states that the wheezing and shortness of breath has been for 2 days. He's had a cough for about 4 days. No productive sputum. No fevers. He has felt short of breath over the last 2 days and has noticed wheezing on expiration. He feels like he has to force air out. He had asthma as a child has not had any as an adult. Never a smoker. His chest has been hurting when he coughs but otherwise no chest pain. No leg swelling. No body aches, sore throat, or nasal congestion. He has allergies but denies any other significant medical problems.  History reviewed. No pertinent past medical history.  Patient Active Problem List   Diagnosis Date Noted  . Lower back injury 09/24/2016  . Hand laceration 09/24/2016  . Neck pain 02/08/2014    History reviewed. No pertinent surgical history.     Home Medications    Prior to Admission medications   Medication Sig Start Date End Date Taking? Authorizing Provider  benzonatate (TESSALON) 100 MG capsule Take 1 capsule (100 mg total) by mouth 3 (three) times daily as needed for cough. 02/06/17   Pricilla LovelessScott Drevion Offord, MD  diclofenac (VOLTAREN) 75 MG EC tablet  09/19/16   Historical Provider, MD  HYDROcodone-ibuprofen (VICOPROFEN) 7.5-200 MG tablet Take 1 tablet by mouth every 6 (six) hours as needed for moderate pain. 10/26/16   Lenda KelpShane R Hudnall, MD    Family History No family history on file.  Social History Social History  Substance Use Topics  . Smoking status: Never Smoker  . Smokeless tobacco: Current User  . Alcohol use No     Allergies   Patient has no known allergies.   Review of Systems Review of Systems  Constitutional:  Negative for fever.  HENT: Positive for rhinorrhea (mild). Negative for congestion.   Respiratory: Positive for cough, shortness of breath and wheezing.   Cardiovascular: Positive for chest pain (when coughing).  All other systems reviewed and are negative.    Physical Exam Updated Vital Signs BP (!) 151/104 (BP Location: Right Arm)   Pulse 100   Temp 98.3 F (36.8 C) (Oral)   Resp (!) 9   SpO2 96%   Physical Exam  Constitutional: He is oriented to person, place, and time. He appears well-developed and well-nourished. No distress.  obese  HENT:  Head: Normocephalic and atraumatic.  Right Ear: External ear normal.  Left Ear: External ear normal.  Nose: Nose normal.  Eyes: Right eye exhibits no discharge. Left eye exhibits no discharge.  Neck: Neck supple.  Cardiovascular: Normal rate, regular rhythm and normal heart sounds.   Pulmonary/Chest: Effort normal. No accessory muscle usage. Tachypnea (mild) noted. No respiratory distress. He has wheezes (expiratory). He exhibits tenderness (bilateral lower chest wall).  Speaks in nearly complete sentences  Abdominal: Soft. There is no tenderness.  Musculoskeletal: He exhibits no edema.  Neurological: He is alert and oriented to person, place, and time.  Skin: Skin is warm and dry. He is not diaphoretic.  Nursing note and vitals reviewed.    ED Treatments / Results  Labs (all labs ordered are listed, but only abnormal results  are displayed) Labs Reviewed - No data to display  EKG  EKG Interpretation  Date/Time:  Wednesday February 06 2017 07:42:28 EDT Ventricular Rate:  91 PR Interval:    QRS Duration: 95 QT Interval:  355 QTC Calculation: 437 R Axis:   23 Text Interpretation:  Sinus rhythm nonspecific T wave flattening I, AVL No old tracing to compare Confirmed by Buford Bremer MD, Namiyah Grantham [phone]) on 02/06/2017 7:45:23 AM Also confirmed by Criss Alvine MD, Minervia Osso [id number]), editor Valentina Lucks CT, Jola Babinski [phone])  on 02/06/2017 8:13:36 AM        Radiology Dg Chest 2 View  Result Date: 02/06/2017 CLINICAL DATA:  Shortness of breath and cough for 4 days.  Wheezing. EXAM: CHEST  2 VIEW COMPARISON:  None. FINDINGS: Lungs are clear. Heart size and pulmonary vascularity are normal. No adenopathy. No bone lesions. IMPRESSION: No edema or consolidation. Electronically Signed   By: Bretta Bang III M.D.   On: 02/06/2017 07:45    Procedures Procedures (including critical care time)  Medications Ordered in ED Medications  ipratropium-albuterol (DUONEB) 0.5-2.5 (3) MG/3ML nebulizer solution 3 mL (3 mLs Nebulization Given 02/06/17 0717)  albuterol (PROVENTIL) (2.5 MG/3ML) 0.083% nebulizer solution 2.5 mg (2.5 mg Nebulization Given 02/06/17 0717)  albuterol (PROVENTIL) (2.5 MG/3ML) 0.083% nebulizer solution 2.5 mg (2.5 mg Nebulization Given 02/06/17 0751)  albuterol (PROVENTIL) (2.5 MG/3ML) 0.083% nebulizer solution (2.5 mg  Given 02/06/17 0843)  albuterol (PROVENTIL HFA;VENTOLIN HFA) 108 (90 Base) MCG/ACT inhaler 2 puff (2 puffs Inhalation Given 02/06/17 0854)     Initial Impression / Assessment and Plan / ED Course  I have reviewed the triage vital signs and the nursing notes.  Pertinent labs & imaging results that were available during my care of the patient were reviewed by me and considered in my medical decision making (see chart for details).  Clinical Course as of Feb 06 913  Wed Feb 06, 2017  0715 Patient appears to have acute bronchitis. He is not a smoker. He does have a history of asthma when he was a child but nonetheless an adult. Start with a breathing treatment, get chest x-ray to help rule out pneumonia. Does not appear in distress.  [SG]  [phone] Patient is feeling somewhat better. Chest x-ray is reassuring with no edema or pneumonia. ECG without concerning acute findings. I think this is most likely bronchitis. Discussed symptomatic care. When better will d/c with inhaler  [SG]  [phone] Patient is feeling much better. He  still has wheezing but I think this is likely going to continue while he has the bronchitis. However he is feeling much better, no hypoxia or distress. Plan to discharge with an inhaler and symptomatic care.  [SG]    Clinical Course User Index [SG] Pricilla Loveless, MD    Very low suspicion for PE, ACS. His chest pain is likely from coughing and muscular strain. No indication for antibiotics.  Final Clinical Impressions(s) / ED Diagnoses   Final diagnoses:  Bronchitis  Muscle strain of chest wall, initial encounter    New Prescriptions New Prescriptions   BENZONATATE (TESSALON) 100 MG CAPSULE    Take 1 capsule (100 mg total) by mouth 3 (three) times daily as needed for cough.     Pricilla Loveless, MD 02/06/17 [phone]

## 2017-02-06 NOTE — ED Notes (Signed)
Pt states he still feels sob and "wheezy" after treatment. Rt at bedside.

## 2017-02-06 NOTE — ED Triage Notes (Signed)
Pt c/o cough with SOB since the weekend, states now wheezing, congestion and SOB is worse; pt speaking in complete sentences

## 2017-02-06 NOTE — ED Notes (Signed)
ED Provider at bedside. 

## 2017-03-01 ENCOUNTER — Encounter: Payer: Self-pay | Admitting: Family Medicine

## 2017-03-01 ENCOUNTER — Ambulatory Visit (INDEPENDENT_AMBULATORY_CARE_PROVIDER_SITE_OTHER): Payer: BLUE CROSS/BLUE SHIELD | Admitting: Family Medicine

## 2017-03-01 ENCOUNTER — Ambulatory Visit (HOSPITAL_BASED_OUTPATIENT_CLINIC_OR_DEPARTMENT_OTHER)
Admission: RE | Admit: 2017-03-01 | Discharge: 2017-03-01 | Disposition: A | Discharge status: Home / Self Care | Payer: BLUE CROSS/BLUE SHIELD | Source: Ambulatory Visit | Attending: Family Medicine | Admitting: Family Medicine

## 2017-03-01 VITALS — BP 147/101 | HR 91 | Ht 72.0 in | Wt 308.0 lb

## 2017-03-01 DIAGNOSIS — M8000XA Age-related osteoporosis with current pathological fracture, unspecified site, initial encounter for fracture: Secondary | ICD-10-CM | POA: Diagnosis not present

## 2017-03-01 DIAGNOSIS — M79675 Pain in left toe(s): Secondary | ICD-10-CM | POA: Diagnosis not present

## 2017-03-01 DIAGNOSIS — M79672 Pain in left foot: Secondary | ICD-10-CM | POA: Diagnosis not present

## 2017-03-01 DIAGNOSIS — M7989 Other specified soft tissue disorders: Secondary | ICD-10-CM | POA: Diagnosis not present

## 2017-03-01 MED ORDER — DICLOFENAC SODIUM 75 MG PO TBEC: 75.0000 mg | DELAYED_RELEASE_TABLET | Freq: Two times a day (BID) | ORAL | 1 refills | Status: DC

## 2017-03-01 MED ORDER — HYDROCODONE-ACETAMINOPHEN 7.5-325 MG PO TABS: 1.0000 | ORAL_TABLET | ORAL | 0 refills | Status: DC | PRN

## 2017-03-01 MED FILL — DICLOFENAC SOD 75 MG TAB EC: 75 | 30 days supply | Qty: 60 | Fill #0

## 2017-03-01 MED FILL — HYDROCODON-APAP 7.5-325: 7.5-325 | 5 days supply | Qty: 30 | Fill #0

## 2017-03-01 NOTE — Patient Instructions (Signed)
You have an acute gout flare. Take diclofenac  twice a day with food until pain resolves. Don't take ibuprofen or aleve with this. Oxycodone every 6 hours as needed for severe pain - no driving on this medicine. Wear the comfortable boots you have. Consider buddy taping if this cuts down on the pain. Icing 15 minutes at a time 3-4 times a day. Elevate above your heart when possible. Can consider colchicine, prednisone dose pack if not improving as expected. Let me know how you're doing in 2 weeks. We have ordered the bone density test - let me know a couple days after you have this if you still haven't heard from me. I will likely also have you see an endocrinologist (I will speak to my wife first though).

## 2017-03-04 DIAGNOSIS — M79675 Pain in left toe(s): Secondary | ICD-10-CM | POA: Insufficient documentation

## 2017-03-04 NOTE — Assessment & Plan Note (Signed)
independently reviewed radiographs and no evidence fracture.  Consistent with initial acute gout flare.  Will try to avoid prednisone given has had a compression fracture in past.  Diclofenac  twice a day, oxycodone as needed for severe pain.  Consider buddy taping in addition to cam walker for support.  Let us know how he's doing in 2 weeks.

## 2017-03-04 NOTE — Progress Notes (Signed)
PCP: No PCP Per Patient  Subjective:   HPI: Patient is a 41 y.o. male here for left great toe pain.  Patient reports he started to get severe pain, swelling in left foot mainly about the left great toe yesterday. Pain up to 8/10, sharp, worse with walking. No acute injury right before this - thinks maybe had a ball hit this but did not hurt continuously after this. No history of gout. No fevers, chills, sweats. No skin changes, numbness. No recent illness.  No past medical history on file.  Current Outpatient Prescriptions on File Prior to Visit  Medication Sig Dispense Refill  . benzonatate (TESSALON) 100 MG capsule Take 1 capsule (100 mg total) by mouth 3 (three) times daily as needed for cough. 21 capsule 0   No current facility-administered medications on file prior to visit.     No past surgical history on file.  No Known Allergies  Social History   Social History  . Marital status: Married    Spouse name: N/A  . Number of children: N/A  . Years of education: N/A   Occupational History  . Not on file.   Social History Main Topics  . Smoking status: Never Smoker  . Smokeless tobacco: Current User  . Alcohol use No  . Drug use: Yes    Types: Marijuana     Comment: occasional  . Sexual activity: Not on file   Other Topics Concern  . Not on file   Social History Narrative  . No narrative on file    No family history on file.  BP (!) 147/101   Pulse 91   Ht 6' (1.829 m)   Wt (!) 308 lb (139.7 kg)   BMI 41.77 kg/m   Review of Systems: See HPI above.     Objective:  Physical Exam:  Gen: NAD, comfortable in exam room  Left foot/ankle: Mod swelling dorsal foot centered mainly about 1st MTP joint.  No bruising.  + warmth but no erythema. TTP 1st MTP joint, less surrounding this area. Minimal ROM 1st MTP.  FROM ankle. Cap refill < 2 sec. NVI distally.  Right foot/ankle: FROM without pain.   Assessment & Plan:  1. Left great toe pain -  independently reviewed radiographs and no evidence fracture.  Consistent with initial acute gout flare.  Will try to avoid prednisone given has had a compression fracture in past.  Diclofenac  twice a day, oxycodone as needed for severe pain.  Consider buddy taping in addition to cam walker for support.  Let us know how he's doing in 2 weeks.

## 2017-06-13 ENCOUNTER — Ambulatory Visit (INDEPENDENT_AMBULATORY_CARE_PROVIDER_SITE_OTHER): Payer: BLUE CROSS/BLUE SHIELD | Admitting: Family Medicine

## 2017-06-13 ENCOUNTER — Encounter: Payer: Self-pay | Admitting: Family Medicine

## 2017-06-13 DIAGNOSIS — M79671 Pain in right foot: Secondary | ICD-10-CM | POA: Diagnosis not present

## 2017-06-13 DIAGNOSIS — M542 Cervicalgia: Secondary | ICD-10-CM | POA: Diagnosis not present

## 2017-06-13 MED ORDER — HYDROCODONE-ACETAMINOPHEN 5-325 MG PO TABS: 1.0000 | ORAL_TABLET | Freq: Four times a day (QID) | ORAL | 0 refills | Status: DC | PRN

## 2017-06-13 MED ORDER — DICLOFENAC SODIUM 75 MG PO TBEC: 75.0000 mg | DELAYED_RELEASE_TABLET | Freq: Two times a day (BID) | ORAL | 1 refills | Status: DC

## 2017-06-13 MED ORDER — DIAZEPAM 5 MG PO TABS: 5.0000 mg | ORAL_TABLET | Freq: Three times a day (TID) | ORAL | 0 refills | Status: DC | PRN

## 2017-06-13 MED FILL — HYDROCODON-APAP 5-325: 5-325 | 5 days supply | Qty: 20 | Fill #0

## 2017-06-13 MED FILL — diazePAM 5 MG TABS: 5 | 5 days supply | Qty: 15 | Fill #0

## 2017-06-13 NOTE — Patient Instructions (Signed)
You have severe muscle spasm/strain of your right trapezius. Diclofenac twice a day with food for pain and inflammation. Valium as needed for severe spasms (no driving on this).   Norco as needed for severe pain (no driving on this medicine). Simple range of motion exercises within limits of pain to prevent further stiffness. Call me in 1-2 weeks if not improving. Heat or ice (whichever feels better) 15 minutes at a time 3-4 times a day to help with spasms. Watch head position when on computers, texting, when sleeping in bed - should in line with back to prevent further spasms. Call me in a week if you're not improving.  You have metatarsalgia of your foot. This typically responds to the metatarsal pad in inserts. Avoid flat shoes, barefoot walking as much as possible. If the inserts don't feel right let me know and bring your shoes with you so we can readjust. Follow up with me in 1 month.

## 2017-06-14 DIAGNOSIS — M79671 Pain in right foot: Secondary | ICD-10-CM | POA: Insufficient documentation

## 2017-06-14 NOTE — Assessment & Plan Note (Signed)
2/2 right trapezius spasms/strain.  Diclofenac with valium and norco as needed.  Heat or ice.  Simple motion exercises.  Discussed ergonomic issues.  Call us in 1-2 weeks if not improving.

## 2017-06-14 NOTE — Progress Notes (Signed)
Patient ID: Delila Spenceeter A [place], male   DOB: 05/27/1976, 41 y.o.   MRN: [id number]  PCP: Patient, No Pcp Per  Subjective:   HPI: Patient is a 41 y.o. male here for neck, right foot pain.  3/12: Patient denies known injury. States about 2 weeks ago he just developed right sided neck pain that has worsened since then. Associated with nausea when pain is severe. Worse with turning to right side. No radiation. Tried ibuprofen, oxycodone, valium. Has been icing also. No bowel/bladder dysfunction.  07/19/14: Patient reports he was working in the yard about 1 week ago moving tree limbs. No known injury though felt like he had a pulled muscle especially by the next morning. Pain in same area right side of neck. Has to support elbow to get comfortable. Improved completely following treatment in March and this feels similar (prednisone, valium, norco). Did not do PT since he improved. Pain worse looking to the left. No bowel/bladder dysfunction. No numbness/tingling.  11/30/14: Patient reports similar issue to last visit. Was working a lot in the yard about a week ago and that day and the next started to develop pain in right side of upper back, neck. No numbness or tingling. No bowel/bladder dysfunction. No radiation into his arm. Again completely improved after last visit with prednisone, valium, norco.  02/16/16: Patient reports he was carrying heavy logs on 3/18 and started to get pain right side of neck, upper back again. No numbness or tingling. Feels better to have arm propped up. Pain level is 8/10, sharp. No bowel/bladder dysfunction. No radiation into arm.  09/20/16: Patient reports he was breaking up a fight between his dogs on 10/24 - when pulling one of the dogs up he felt a pop in his low back. Dog's tooth sliced his left hand in thenar area. Pain in right side of low back is 10/10 level, sharp. Also with pain again right side of neck. No redness, fever, other skin  changes. No bowel/bladder dysfunction. No numbness or tingling. Having nausea related to the pain.  06/13/17: Patient reports over the weekend he was doing a lot of landscaping and noted the day after this very bad pain right side of neck and inside of shoulder blade. Pain level up to 8/10 and throbbing, sharp. No radiation into upper extremities. No skin changes, numbness. No bowel/bladder dysfunction. He also reports pain under right 2nd digit worse by end of day at work. Wears boots, does a lot of standing and walking. Due to switch jobs though soon. Been using biofreeze and taking ibuprofen. Pain here also 8/10 level.  No past medical history on file.  No current outpatient prescriptions on file prior to visit.   No current facility-administered medications on file prior to visit.     No past surgical history on file.  No Known Allergies  Social History   Social History  . Marital status: Married    Spouse name: N/A  . Number of children: N/A  . Years of education: N/A   Occupational History  . Not on file.   Social History Main Topics  . Smoking status: Never Smoker  . Smokeless tobacco: Former NeurosurgeonUser  . Alcohol use No  . Drug use: Yes    Types: Marijuana     Comment: occasional  . Sexual activity: Not on file   Other Topics Concern  . Not on file   Social History Narrative  . No narrative on file    No family history on file.  BP (!) 154/112   Pulse 84   Ht 5\' 11"  (1.803 m)   Wt 300 lb (136.1 kg)   BMI 41.84 kg/m   Review of Systems: See HPI above.    Objective:  Physical Exam:  Gen: NAD  Neck: No gross deformity, swelling, bruising. TTP right trapezius and rhomboids.  No midline/bony TTP. FROM neck - pain on flexion and right lateral rotation. BUE strength 5/5.   Sensation intact to light touch.   NV intact distal BUEs.  Right foot/ankle: No gross deformity, swelling, ecchymoses Transverse arch collapse with callus under all MT  heads. FROM ankle, digits without pain. TTP plantar 2nd MT head.  No dorsal tenderness. Negative ant drawer and talar tilt.   Negative syndesmotic compression. Positive metatarsal squeeze. Thompsons test negative. NV intact distally.  MSK u/s right foot:  No evidence cortical irregularity, edema, or neovascularity to suggest stress fracture.  Assessment & Plan:  1. Right neck pain - 2/2 right trapezius spasms/strain.  Diclofenac with valium and norco as needed.  Heat or ice.  Simple motion exercises.  Discussed ergonomic issues.  Call us in 1-2 weeks if not improving.    2. Right foot metatarsalgia - ultrasound reassuring.  2/2 metatarsalgia.  Avoid flat shoes, barefoot walking.  MT pad placed in sports insoles.  F/u in 1 month.

## 2017-06-14 NOTE — Assessment & Plan Note (Signed)
Right foot metatarsalgia - ultrasound reassuring.  2/2 metatarsalgia.  Avoid flat shoes, barefoot walking.  MT pad placed in sports insoles.  F/u in 1 month.

## 2017-09-20 DIAGNOSIS — B070 Plantar wart: Secondary | ICD-10-CM | POA: Diagnosis not present

## 2017-09-20 DIAGNOSIS — M79671 Pain in right foot: Secondary | ICD-10-CM | POA: Diagnosis not present

## 2018-01-21 ENCOUNTER — Ambulatory Visit: Payer: BLUE CROSS/BLUE SHIELD | Admitting: Family Medicine

## 2018-01-21 ENCOUNTER — Encounter: Payer: Self-pay | Admitting: Family Medicine

## 2018-01-21 DIAGNOSIS — S3992XD Unspecified injury of lower back, subsequent encounter: Secondary | ICD-10-CM | POA: Diagnosis not present

## 2018-01-21 MED ORDER — DIAZEPAM 5 MG PO TABS: 5.0000 mg | ORAL_TABLET | Freq: Three times a day (TID) | ORAL | 0 refills | Status: DC | PRN

## 2018-01-21 MED ORDER — DICLOFENAC SODIUM 75 MG PO TBEC: 75.0000 mg | DELAYED_RELEASE_TABLET | Freq: Two times a day (BID) | ORAL | 1 refills | Status: DC

## 2018-01-21 MED ORDER — HYDROCODONE-ACETAMINOPHEN 5-325 MG PO TABS: 1.0000 | ORAL_TABLET | Freq: Four times a day (QID) | ORAL | 0 refills | Status: DC | PRN

## 2018-01-21 MED FILL — diazePAM 5 MG TABS: 5 | 5 days supply | Qty: 15 | Fill #0

## 2018-01-21 MED FILL — HYDROCODON-APAP 5-325: 5-325 | 5 days supply | Qty: 20 | Fill #0

## 2018-01-21 MED FILL — DICLOFENAC SODIUM 75 MG TAB: 75 | 30 days supply | Qty: 60 | Fill #0

## 2018-01-21 NOTE — Assessment & Plan Note (Signed)
exam and history are reassuring.  No midline tenderness to warrant concern for vertebral compression fracture so radiographs deferred.  He will start voltaren with norco and valium as needed.  Home stretches.  F/u in 1 week.  Consider physical therapy if improving, MRI if not improving.

## 2018-01-21 NOTE — Progress Notes (Signed)
PCP: Patient, No Pcp Per  Subjective:   HPI: Patient is a 42 y.o. male here for low back/hip injury.  Patient reports on 2/24 he was coming down ramp in front of his house, feet slid out from under him and he landed hard onto left side of back and lateral upper hip. Immediate pain that has persisted at 9/10 level, sharp. Tried heating pad, ibuprofen. Pain worse with valsalva including coughing, sneezing, straining to use bathroom. Difficulty sleeping due to pain. Has history of compression fracture of T12. No radiation into legs. No numbness/tingling. No bowel/bladder dysfunction, only pain in left side back with straining.  History reviewed. No pertinent past medical history.  No current outpatient medications on file prior to visit.   No current facility-administered medications on file prior to visit.     History reviewed. No pertinent surgical history.  No Known Allergies  Social History   Socioeconomic History  . Marital status: Married    Spouse name: Not on file  . Number of children: Not on file  . Years of education: Not on file  . Highest education level: Not on file  Social Needs  . Financial resource strain: Not on file  . Food insecurity - worry: Not on file  . Food insecurity - inability: Not on file  . Transportation needs - medical: Not on file  . Transportation needs - non-medical: Not on file  Occupational History  . Not on file  Tobacco Use  . Smoking status: Never Smoker  . Smokeless tobacco: Former Engineer, waterUser  Substance and Sexual Activity  . Alcohol use: No    Alcohol/week: 0.0 oz  . Drug use: Yes    Types: Marijuana    Comment: occasional  . Sexual activity: Not on file  Other Topics Concern  . Not on file  Social History Narrative  . Not on file    History reviewed. No pertinent family history.  BP (!) 149/115   Pulse (!) 101   Ht 5\' 11"  (1.803 m)   Wt (!) 320 lb (145.2 kg)   BMI 44.63 kg/m   Review of Systems: See HPI above.   Objective:  Physical Exam:  Gen: NAD, comfortable in exam room  Back: No gross deformity, scoliosis.  No bruising, swelling. TTP left lumbar paraspinal region and over left sided obliques.  No midline or bony TTP. FROM with pain on right side bend and bilateral trunk rotation. Strength LEs 5/5 all muscle groups.   2+ MSRs in patellar and achilles tendons, equal bilaterally. Negative SLRs. Sensation intact to light touch bilaterally.  Left hip: No deformity. No TTP greater trochanter, elsewhere around hip. FROM with negative logroll. 5/5 strength all motions. NVI distally.   Assessment & Plan:  1. Low back injury - exam and history are reassuring.  No midline tenderness to warrant concern for vertebral compression fracture so radiographs deferred.  He will start voltaren with norco and valium as needed.  Home stretches.  F/u in 1 week.  Consider physical therapy if improving, MRI if not improving.

## 2018-01-21 NOTE — Patient Instructions (Signed)
You have a severe lumbar strain. Start voltaren twice a day with food for pain and inflammation. Norco as needed for severe pain (no driving on this medicine). Valium as needed for muscle spasms (no driving on this medicine if it makes you sleepy). Stay as active as possible. Physical therapy has been shown to be helpful as well. Strengthening of low back muscles, abdominal musculature are key for long term pain relief. If not improving, will consider further imaging (MRI). Follow up with me in 1 week (or right before/after your vacation).

## 2018-02-24 ENCOUNTER — Ambulatory Visit: Payer: BLUE CROSS/BLUE SHIELD | Admitting: Family Medicine

## 2018-02-24 ENCOUNTER — Ambulatory Visit (HOSPITAL_BASED_OUTPATIENT_CLINIC_OR_DEPARTMENT_OTHER)
Admission: RE | Admit: 2018-02-24 | Discharge: 2018-02-24 | Disposition: A | Discharge status: Home / Self Care | Payer: BLUE CROSS/BLUE SHIELD | Source: Ambulatory Visit | Attending: Family Medicine | Admitting: Family Medicine

## 2018-02-24 ENCOUNTER — Encounter: Payer: Self-pay | Admitting: Family Medicine

## 2018-02-24 VITALS — BP 183/96 | HR 89 | Ht 72.0 in | Wt 310.0 lb

## 2018-02-24 DIAGNOSIS — M545 Low back pain, unspecified: Secondary | ICD-10-CM

## 2018-02-24 DIAGNOSIS — X58XXXA Exposure to other specified factors, initial encounter: Secondary | ICD-10-CM | POA: Diagnosis not present

## 2018-02-24 DIAGNOSIS — S22089A Unspecified fracture of T11-T12 vertebra, initial encounter for closed fracture: Secondary | ICD-10-CM | POA: Insufficient documentation

## 2018-02-24 DIAGNOSIS — S3992XD Unspecified injury of lower back, subsequent encounter: Secondary | ICD-10-CM | POA: Diagnosis not present

## 2018-02-24 DIAGNOSIS — K5720 Diverticulitis of large intestine with perforation and abscess without bleeding: Secondary | ICD-10-CM

## 2018-02-24 DIAGNOSIS — M546 Pain in thoracic spine: Secondary | ICD-10-CM | POA: Diagnosis not present

## 2018-02-24 HISTORY — DX: Diverticulitis of large intestine with perforation and abscess without bleeding: K57.20

## 2018-02-24 MED ORDER — PREDNISONE 10 MG PO TABS
ORAL_TABLET | ORAL | 0 refills | Status: DC
Start: 2018-02-24 — End: 2018-03-15

## 2018-02-24 MED ORDER — DIAZEPAM 5 MG PO TABS: 5.0000 mg | ORAL_TABLET | Freq: Three times a day (TID) | ORAL | 0 refills | Status: DC | PRN

## 2018-02-24 MED ORDER — HYDROCODONE-ACETAMINOPHEN 5-325 MG PO TABS: 1.0000 | ORAL_TABLET | Freq: Four times a day (QID) | ORAL | 0 refills | Status: DC | PRN

## 2018-02-24 MED FILL — predniSONE 10 MG TABS: 10 | 6 days supply | Qty: 21 | Fill #0

## 2018-02-24 MED FILL — HYDROCODON-APAP 5-325: 5-325 | 5 days supply | Qty: 20 | Fill #0

## 2018-02-24 MED FILL — diazePAM 5 MG TABS: 5 | 5 days supply | Qty: 15 | Fill #0

## 2018-02-24 NOTE — Progress Notes (Signed)
PCP: Patient, No Pcp Per  Subjective:   HPI: Patient is a 42 y.o. male here for low back/hip injury.  2/26: Patient reports on 2/24 he was coming down ramp in front of his house, feet slid out from under him and he landed hard onto left side of back and lateral upper hip. Immediate pain that has persisted at 9/10 level, sharp. Tried heating pad, ibuprofen. Pain worse with valsalva including coughing, sneezing, straining to use bathroom. Difficulty sleeping due to pain. Has history of compression fracture of T12. No radiation into legs. No numbness/tingling. No bowel/bladder dysfunction, only pain in left side back with straining.  4/1: Patient returns with 10/10 level sharp pain now on the right side of his back from thoracic down to lumbar spine areas. No radiation into legs. Left side not as bad as last visit but reports pain has persisted since his last visit. Worse with coughing. Using heating pad and taking voltaren without much help. No bowel/bladder dysfunction. No numbness.  History reviewed. No pertinent past medical history.  No current outpatient medications on file prior to visit.   No current facility-administered medications on file prior to visit.     History reviewed. No pertinent surgical history.  No Known Allergies  Social History   Socioeconomic History  . Marital status: Married    Spouse name: Not on file  . Number of children: Not on file  . Years of education: Not on file  . Highest education level: Not on file  Occupational History  . Not on file  Social Needs  . Financial resource strain: Not on file  . Food insecurity:    Worry: Not on file    Inability: Not on file  . Transportation needs:    Medical: Not on file    Non-medical: Not on file  Tobacco Use  . Smoking status: Never Smoker  . Smokeless tobacco: Former Engineer, water and Sexual Activity  . Alcohol use: No    Alcohol/week: 0.0 oz  . Drug use: Yes    Types: Marijuana     Comment: occasional  . Sexual activity: Not on file  Lifestyle  . Physical activity:    Days per week: Not on file    Minutes per session: Not on file  . Stress: Not on file  Relationships  . Social connections:    Talks on phone: Not on file    Gets together: Not on file    Attends religious service: Not on file    Active member of club or organization: Not on file    Attends meetings of clubs or organizations: Not on file    Relationship status: Not on file  . Intimate partner violence:    Fear of current or ex partner: Not on file    Emotionally abused: Not on file    Physically abused: Not on file    Forced sexual activity: Not on file  Other Topics Concern  . Not on file  Social History Narrative  . Not on file    History reviewed. No pertinent family history.  BP (!) 183/96   Pulse 89   Ht 6' (1.829 m)   Wt (!) 310 lb (140.6 kg)   BMI 42.04 kg/m   Review of Systems: See HPI above.     Objective:  Physical Exam:  Gen: NAD, comfortable in exam room.  Back: No gross deformity, scoliosis. TTP right thoracic to lumbar paraspinal regions.  Tenderness upper lumbar region in midline.  Full extension.  Cannot flex due to pain.  Pain on bilateral lateral rotations also. Strength LEs 5/5 all muscle groups.   2+ MSRs in patellar and achilles tendons, equal bilaterally. Negative SLRs. Sensation intact to light touch bilaterally.  Bilateral hips: No deformity. FROM with 5/5 strength. No tenderness to palpation. NVI distally. Negative logroll bilateral hips   Assessment & Plan:  1. Back pain - pain currently on right side of back now without new injury.  Reports pain has continued from his last visit about 5 weeks ago.  Independently reviewed radiographs and no evidence new bony abnormalities, compression fracture.  2/2 severe lumbar, thoracic strains.  Discussed risks/benefits and decided to go ahead with prednisone dose pack with norco, valium as needed.  Shown  home exercises to do daily.  F/u in 3 weeks but call us in a week if not improving.  He will consider physical therapy.

## 2018-02-24 NOTE — Patient Instructions (Signed)
You have severe lumbar/thoracic strains. Prednisone dose pack x 6 days. Norco as needed for severe pain (no driving on this medicine). Valium as needed for muscle spasms (no driving on this medicine if it makes you sleepy). Stay as active as possible. Physical therapy has been shown to be helpful as well - let me know if you want to do this. Strengthening of low back muscles, abdominal musculature are key for long term pain relief. If not improving, will consider further imaging (MRI). Follow up with me in 3 weeks but call me in a week if you're not improving as expected.

## 2018-02-24 NOTE — Assessment & Plan Note (Signed)
pain currently on right side of back now without new injury.  Reports pain has continued from his last visit about 5 weeks ago.  Independently reviewed radiographs and no evidence new bony abnormalities, compression fracture.  2/2 severe lumbar, thoracic strains.  Discussed risks/benefits and decided to go ahead with prednisone dose pack with norco, valium as needed.  Shown home exercises to do daily.  F/u in 3 weeks but call us in a week if not improving.  He will consider physical therapy.

## 2018-02-27 ENCOUNTER — Telehealth: Payer: Self-pay | Admitting: Family Medicine

## 2018-02-27 NOTE — Telephone Encounter (Signed)
Patient's wife calling on behalf of patient. States he says the pain medication is not working. He is having extreme pain in his back and side. States the medication is not even "taking the edge off".

## 2018-02-27 NOTE — Telephone Encounter (Signed)
He's on a very strong combination of medicines (prednisone, valium, and hydrocodone).  If he's still not improving and the pain is that severe then we need to repeat the MRI of his low back.

## 2018-02-27 NOTE — Telephone Encounter (Signed)
Wife was informed. She will discuss the MRI with patient and call us back

## 2018-03-03 ENCOUNTER — Inpatient Hospital Stay (HOSPITAL_BASED_OUTPATIENT_CLINIC_OR_DEPARTMENT_OTHER)
Admission: EM | Admit: 2018-03-03 | Discharge: 2018-03-15 | DRG: 392 | Disposition: A | Discharge status: Home / Self Care | Payer: BLUE CROSS/BLUE SHIELD | Attending: General Surgery | Admitting: General Surgery

## 2018-03-03 ENCOUNTER — Encounter (HOSPITAL_BASED_OUTPATIENT_CLINIC_OR_DEPARTMENT_OTHER): Payer: Self-pay

## 2018-03-03 ENCOUNTER — Emergency Department (HOSPITAL_BASED_OUTPATIENT_CLINIC_OR_DEPARTMENT_OTHER): Payer: BLUE CROSS/BLUE SHIELD

## 2018-03-03 ENCOUNTER — Other Ambulatory Visit: Payer: Self-pay

## 2018-03-03 DIAGNOSIS — K5720 Diverticulitis of large intestine with perforation and abscess without bleeding: Principal | ICD-10-CM | POA: Diagnosis present

## 2018-03-03 DIAGNOSIS — Z79899 Other long term (current) drug therapy: Secondary | ICD-10-CM

## 2018-03-03 DIAGNOSIS — Z87891 Personal history of nicotine dependence: Secondary | ICD-10-CM | POA: Diagnosis not present

## 2018-03-03 DIAGNOSIS — M4854XD Collapsed vertebra, not elsewhere classified, thoracic region, subsequent encounter for fracture with routine healing: Secondary | ICD-10-CM | POA: Diagnosis present

## 2018-03-03 DIAGNOSIS — Z6841 Body Mass Index (BMI) 40.0 and over, adult: Secondary | ICD-10-CM

## 2018-03-03 DIAGNOSIS — K760 Fatty (change of) liver, not elsewhere classified: Secondary | ICD-10-CM | POA: Diagnosis not present

## 2018-03-03 DIAGNOSIS — R739 Hyperglycemia, unspecified: Secondary | ICD-10-CM | POA: Diagnosis not present

## 2018-03-03 DIAGNOSIS — R112 Nausea with vomiting, unspecified: Secondary | ICD-10-CM | POA: Diagnosis not present

## 2018-03-03 DIAGNOSIS — R030 Elevated blood-pressure reading, without diagnosis of hypertension: Secondary | ICD-10-CM | POA: Diagnosis not present

## 2018-03-03 DIAGNOSIS — R109 Unspecified abdominal pain: Secondary | ICD-10-CM | POA: Diagnosis not present

## 2018-03-03 DIAGNOSIS — K5732 Diverticulitis of large intestine without perforation or abscess without bleeding: Secondary | ICD-10-CM | POA: Diagnosis not present

## 2018-03-03 DIAGNOSIS — R197 Diarrhea, unspecified: Secondary | ICD-10-CM | POA: Diagnosis not present

## 2018-03-03 DIAGNOSIS — R1013 Epigastric pain: Secondary | ICD-10-CM | POA: Diagnosis not present

## 2018-03-03 DIAGNOSIS — Z79891 Long term (current) use of opiate analgesic: Secondary | ICD-10-CM

## 2018-03-03 DIAGNOSIS — D72829 Elevated white blood cell count, unspecified: Secondary | ICD-10-CM | POA: Diagnosis present

## 2018-03-03 LAB — COMPREHENSIVE METABOLIC PANEL
ALT: 57 U/L (ref 17–63)
AST: 33 U/L (ref 15–41)
Albumin: 3.6 g/dL (ref 3.5–5.0)
Alkaline Phosphatase: 82 U/L (ref 38–126)
Anion gap: 12 (ref 5–15)
BUN: 20 mg/dL (ref 6–20)
CO2: 22 mmol/L (ref 22–32)
Calcium: 9 mg/dL (ref 8.9–10.3)
Chloride: 103 mmol/L (ref 101–111)
Creatinine, Ser: 1.16 mg/dL (ref 0.61–1.24)
GFR calc Af Amer: >60 mL/min (ref >60)
GFR calc non Af Amer: >60 mL/min (ref >60)
Glucose, Bld: 152 mg/dL — ABNORMAL HIGH (ref 65–99)
Potassium: 4.1 mmol/L (ref 3.5–5.1)
Sodium: 137 mmol/L (ref 135–145)
Total Bilirubin: 0.2 mg/dL — ABNORMAL LOW (ref 0.3–1.2)
Total Protein: 7.4 g/dL (ref 6.5–8.1)

## 2018-03-03 LAB — LIPASE, BLOOD: Lipase: 23 U/L (ref 11–51)

## 2018-03-03 LAB — URINALYSIS, ROUTINE W REFLEX MICROSCOPIC
Bilirubin Urine: NEGATIVE
Glucose, UA: NEGATIVE mg/dL
Hgb urine dipstick: NEGATIVE
Ketones, ur: NEGATIVE mg/dL
Leukocytes, UA: NEGATIVE
Nitrite: NEGATIVE
Protein, ur: NEGATIVE mg/dL
Specific Gravity, Urine: 1.02 (ref 1.005–1.030)
pH: 6 (ref 5.0–8.0)

## 2018-03-03 LAB — CBC
HCT: 47.8 % (ref 39.0–52.0)
Hemoglobin: 16.4 g/dL (ref 13.0–17.0)
MCH: 30.1 pg (ref 26.0–34.0)
MCHC: 34.3 g/dL (ref 30.0–36.0)
MCV: 87.7 fL (ref 78.0–100.0)
Platelets: 429 10*3/uL — ABNORMAL HIGH (ref 150–400)
RBC: 5.45 MIL/uL (ref 4.22–5.81)
RDW: 12.3 % (ref 11.5–15.5)
WBC: 15.8 10*3/uL — ABNORMAL HIGH (ref 4.0–10.5)

## 2018-03-03 MED ORDER — HYDROMORPHONE HCL 1 MG/ML IJ SOLN
1.0000 mg | Freq: Once | INTRAMUSCULAR | Status: AC
  Administered 2018-03-03: 1 mg via INTRAVENOUS
  Filled 2018-03-03: qty 1

## 2018-03-03 MED ORDER — ACETAMINOPHEN 325 MG PO TABS: 650.0000 mg | ORAL_TABLET | Freq: Four times a day (QID) | ORAL | Status: DC | PRN

## 2018-03-03 MED ORDER — DIPHENHYDRAMINE HCL 50 MG/ML IJ SOLN: 25.0000 mg | Freq: Four times a day (QID) | INTRAMUSCULAR | Status: DC | PRN

## 2018-03-03 MED ORDER — ONDANSETRON HCL 4 MG/2ML IJ SOLN
4.0000 mg | Freq: Four times a day (QID) | INTRAMUSCULAR | Status: DC | PRN
Start: 2018-03-03 — End: 2018-03-15
  Administered 2018-03-04 – 2018-03-07 (×2): 4 mg via INTRAVENOUS
  Filled 2018-03-03 (×2): qty 2

## 2018-03-03 MED ORDER — IOPAMIDOL (ISOVUE-300) INJECTION 61%
100.0000 mL | Freq: Once | INTRAVENOUS | Status: AC | PRN
  Administered 2018-03-03: 100 mL via INTRAVENOUS

## 2018-03-03 MED ORDER — KETOROLAC TROMETHAMINE 15 MG/ML IJ SOLN
15.0000 mg | Freq: Once | INTRAMUSCULAR | Status: AC
  Administered 2018-03-03: 15 mg via INTRAVENOUS
  Filled 2018-03-03: qty 1

## 2018-03-03 MED ORDER — PIPERACILLIN-TAZOBACTAM 3.375 G IVPB
3.3750 g | Freq: Three times a day (TID) | INTRAVENOUS | Status: DC
  Administered 2018-03-03 – 2018-03-15 (×35): 3.375 g via INTRAVENOUS
  Filled 2018-03-03 (×38): qty 50

## 2018-03-03 MED ORDER — HYDROMORPHONE HCL 1 MG/ML IJ SOLN
1.0000 mg | INTRAMUSCULAR | Status: DC | PRN
  Administered 2018-03-03 – 2018-03-04 (×6): 1 mg via INTRAVENOUS
  Filled 2018-03-03 (×6): qty 1

## 2018-03-03 MED ORDER — SODIUM CHLORIDE 0.9 % IV BOLUS
1000.0000 mL | Freq: Once | INTRAVENOUS | Status: AC
  Administered 2018-03-03: 1000 mL via INTRAVENOUS

## 2018-03-03 MED ORDER — ENOXAPARIN SODIUM 80 MG/0.8ML ~~LOC~~ SOLN
70.0000 mg | SUBCUTANEOUS | Status: DC
  Administered 2018-03-03 – 2018-03-14 (×12): 70 mg via SUBCUTANEOUS
  Filled 2018-03-03 (×12): qty 0.8

## 2018-03-03 MED ORDER — ONDANSETRON 4 MG PO TBDP: 4.0000 mg | ORAL_TABLET | Freq: Four times a day (QID) | ORAL | Status: DC | PRN

## 2018-03-03 MED ORDER — DIPHENHYDRAMINE HCL 25 MG PO CAPS: 25.0000 mg | ORAL_CAPSULE | Freq: Four times a day (QID) | ORAL | Status: DC | PRN

## 2018-03-03 MED ORDER — PIPERACILLIN-TAZOBACTAM 3.375 G IVPB 30 MIN
3.3750 g | Freq: Once | INTRAVENOUS | Status: AC
Start: 2018-03-03 — End: 2018-03-03
  Administered 2018-03-03: 3.375 g via INTRAVENOUS
  Filled 2018-03-03 (×2): qty 50

## 2018-03-03 MED ORDER — HYDROMORPHONE HCL 1 MG/ML IJ SOLN: 1.0000 mg | Freq: Once | INTRAMUSCULAR | Status: DC

## 2018-03-03 MED ORDER — ACETAMINOPHEN 650 MG RE SUPP: 650.0000 mg | Freq: Four times a day (QID) | RECTAL | Status: DC | PRN

## 2018-03-03 MED ORDER — KCL IN DEXTROSE-NACL 20-5-0.9 MEQ/L-%-% IV SOLN
INTRAVENOUS | Status: DC
  Administered 2018-03-03 – 2018-03-07 (×5): via INTRAVENOUS
  Filled 2018-03-03 (×8): qty 1000

## 2018-03-03 MED ORDER — ONDANSETRON HCL 4 MG/2ML IJ SOLN
4.0000 mg | Freq: Once | INTRAMUSCULAR | Status: AC
  Administered 2018-03-03: 4 mg via INTRAVENOUS
  Filled 2018-03-03: qty 2

## 2018-03-03 NOTE — ED Notes (Signed)
Reports no relief after pain medication.

## 2018-03-03 NOTE — Consult Note (Deleted)
Reason for Consult: Diverticulitis with free air Referring Physician: Virgel Manifold CC:  Abdominal pain for 2 days, bloating and constipation Primary care:  None, followed by Karlton Lemon for his back  Paul Gilmore is an 42 y.o. male.  HPI: Presented to the ED at [facility] this a.m. with complaints of 2 days of abdominal pain, bloating, and constipation.  Patient has had a mild T12 compression fracture been present since 09/26/16.  He is on chronic pain medications for this.  He has used these medications to treat his abdominal pain without success at home.  Workup in the ED shows: He is afebrile but hypertensive.  Glucose is 152 CMP is otherwise normal.,  WBC 15.8, hemoglobin 16.4, hematocrit 47.8, platelets 429,000.  Urinalysis is normal.  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 There is scattered free air throughout the abdomen.  Additionally there is diffuse fatty infiltration of the liver.  Patient is being transferred to [facility] emergency department for further evaluation and treatment.  Treatment in the ED includes Zosyn, Toradol, Dilaudid, and Zofran.   Of note repeat films on 02/24/18 of the lumbar spine shows stable T12 compression deformity noted.  Vertebral height was well-maintained disc space narrowing is noted in L4-L5 and L5-S1.  History reviewed. No pertinent past medical history. none History reviewed. No pertinent surgical history. none No family history on file.  Social History:  reports that he has never smoked. He has quit using smokeless tobacco. He reports that he has current or past drug history. Drug: Marijuana. He reports that he does not drink alcohol. Tobacco:  None ETOH:  Some use Drugs:  None Married  3 children 20/17/14 - Works for [employer] and lives with wife and children.     Allergies: No  Known Allergies  Medications:  Prior to Admission medications   Medication Sig Start Date End Date Taking? Authorizing Provider  diazepam (VALIUM) 5 MG tablet Take 1 tablet (5 mg total) by mouth every 8 (eight) hours as needed for muscle spasms. 02/24/18   Hudnall, Sharyn Lull, MD  HYDROcodone-acetaminophen (NORCO) 5-325 MG tablet Take 1 tablet by mouth every 6 (six) hours as needed for moderate pain. 02/24/18   Hudnall, Sharyn Lull, MD  predniSONE (DELTASONE) 10 MG tablet 6 tabs po day 1, 5 tabs po day 2, 4 tabs po day 3, 3 tabs po day 4, 2 tabs po day 5, 1 tab po day 6 02/24/18   Hudnall, Sharyn Lull, MD   Continuous: Anti-infectives (From admission, onward)   Start     Dose/Rate Route Frequency Ordered Stop   03/03/18 1015  piperacillin-tazobactam (ZOSYN) IVPB 3.375 g     3.375 g 100 mL/hr over 30 Minutes Intravenous  Once 03/03/18 1012 03/03/18 1050      Results for orders placed or performed during the hospital encounter of 03/03/18 (from the past 48 hour(s))  Lipase, blood     Status: None   Collection Time: 03/03/18  8:40 AM  Result Value Ref Range   Lipase 23 11 - 51 U/L    Comment: Performed at [facility], [address] [place], [place] [address]  Comprehensive metabolic panel     Status: Abnormal   Collection Time: 03/03/18  8:40 AM  Result Value Ref Range   Sodium 137 135 - 145 mmol/L   Potassium 4.1 3.5 -  5.1 mmol/L   Chloride 103 101 - 111 mmol/L   CO2 22 22 - 32 mmol/L   Glucose, Bld 152 (H) 65 - 99 mg/dL   BUN 20 6 - 20 mg/dL   Creatinine, Ser 1.16 0.61 - 1.24 mg/dL   Calcium 9.0 8.9 - 10.3 mg/dL   Total Protein 7.4 6.5 - 8.1 g/dL   Albumin 3.6 3.5 - 5.0 g/dL   AST 33 15 - 41 U/L   ALT 57 17 - 63 U/L   Alkaline Phosphatase 82 38 - 126 U/L   Total Bilirubin 0.2 (L) 0.3 - 1.2 mg/dL   GFR calc non Af Amer >60 >60 mL/min   GFR calc Af Amer >60 >60 mL/min    Comment: (NOTE) The eGFR has been calculated using the CKD EPI equation. This calculation has not been  validated in all clinical situations. eGFR's persistently <60 mL/min signify possible Chronic Kidney Disease.    Anion gap 12 5 - 15    Comment: Performed at [facility], [address] [place], [place] [address]  CBC     Status: Abnormal   Collection Time: 03/03/18  8:40 AM  Result Value Ref Range   WBC 15.8 (H) 4.0 - 10.5 K/uL   RBC 5.45 4.22 - 5.81 MIL/uL   Hemoglobin 16.4 13.0 - 17.0 g/dL   HCT 47.8 39.0 - 52.0 %   MCV 87.7 78.0 - 100.0 fL   MCH 30.1 26.0 - 34.0 pg   MCHC 34.3 30.0 - 36.0 g/dL   RDW 12.3 11.5 - 15.5 %   Platelets 429 (H) 150 - 400 K/uL    Comment: Performed at [facility], [address] [place], [place] [address]  Urinalysis, Routine w reflex microscopic     Status: None   Collection Time: 03/03/18  8:40 AM  Result Value Ref Range   Color, Urine YELLOW YELLOW   APPearance CLEAR CLEAR   Specific Gravity, Urine 1.020 1.005 - 1.030   pH 6.0 5.0 - 8.0   Glucose, UA NEGATIVE NEGATIVE mg/dL   Hgb urine dipstick NEGATIVE NEGATIVE   Bilirubin Urine NEGATIVE NEGATIVE   Ketones, ur NEGATIVE NEGATIVE mg/dL   Protein, ur NEGATIVE NEGATIVE mg/dL   Nitrite NEGATIVE NEGATIVE   Leukocytes, UA NEGATIVE NEGATIVE    Comment: Microscopic not done on urines with negative protein, blood, leukocytes, nitrite, or glucose < 500 mg/dL. Performed at [facility], [address] [place], [place] [address]     Ct Abdomen Pelvis W Contrast  Result Date: 03/03/2018 CLINICAL DATA:  Abdominal pain for the past few days.  Diarrhea. EXAM: CT ABDOMEN AND PELVIS WITH CONTRAST TECHNIQUE: Multidetector CT imaging of the abdomen and pelvis was performed using the standard protocol following bolus administration of intravenous contrast. CONTRAST:  150m ISOVUE-300 IOPAMIDOL (ISOVUE-300) INJECTION 61% COMPARISON:  None. FINDINGS: Lower chest: The lung bases are clear of acute process. No pleural effusion or pulmonary lesions. The heart is normal in size. No  pericardial effusion. The distal esophagus and aorta are unremarkable. Hepatobiliary: No focal hepatic lesions or intrahepatic biliary dilatation. Diffuse fatty infiltration of the liver. The gallbladder appears normal. Focal fatty sparing around the gallbladder fossa. No common bile duct dilatation. Pancreas: No mass, inflammation or ductal dilatation. Spleen: Normal size.  No focal lesions. Adrenals/Urinary Tract: The adrenal glands and kidneys are unremarkable. No ureteral or bladder calculi. No renal or bladder lesions. Stomach/Bowel: Acute complicated diverticulitis involving the sigmoid colon  with a perforated diverticulum and developing abscess in the sigmoid mesocolon. There is also free air scattered throughout the abdomen. The stomach, duodenum, small bowel and remainder of the colon are unremarkable. There is age advanced descending and sigmoid colon diverticulosis. The terminal ileum and appendix are unremarkable. Vascular/Lymphatic: There are numerous small scattered retroperitoneal and sigmoid mesocolon lymph nodes which are likely inflammatory/hyperplastic. The major vascular structures are normal. Reproductive: The prostate gland and seminal vesicles are unremarkable. Other: Small amount of free fluid in the right pericolic gutter. Marked inflammation of the lower mesentery. Musculoskeletal: No significant bony findings. Stable T12 compression fracture and degenerative changes involving the hips. IMPRESSION: 1. 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 2. Diffuse fatty infiltration of the liver. 3. No mass or adenopathy. Electronically Signed   By: Marijo Sanes M.D.   On: 03/03/2018 10:18    Review of Systems  Constitutional: Negative.   HENT: Negative.   Eyes: Negative.   Respiratory: Negative.        He snores and is restless at night, but no  dx of sleep apnea.  Cardiovascular: Negative.   Gastrointestinal: Positive for abdominal pain and blood in stool (occasional on toilet paper). Negative for constipation, diarrhea, melena and vomiting. Nausea: this AM, better with zofran.       Some soft stool after laxative, but no diarrhea or constipation  Genitourinary: Negative.   Musculoskeletal: Positive for back pain (Back pain for the last year.  Primary was anticipating  repeat MRI). Negative for falls and joint pain.  Skin: Negative.   Neurological: Negative.   Endo/Heme/Allergies: Negative.   Psychiatric/Behavioral: Negative.    Blood pressure (!) 139/92, pulse 98, temperature 98.1 F (36.7 C), temperature source Oral, resp. rate 20, SpO2 100 %. Physical Exam  Constitutional: He is oriented to person, place, and time. He appears well-developed and well-nourished. No distress.  72 inches  320 lbs  BMI ~ 43.3  HENT:  Head: Normocephalic and atraumatic.  Mouth/Throat: Oropharynx is clear and moist. No oropharyngeal exudate.  Eyes: Right eye exhibits no discharge. Left eye exhibits no discharge. No scleral icterus.  Pupils are equal  Neck: Normal range of motion. Neck supple. No JVD present. No tracheal deviation present. No thyromegaly present.  Cardiovascular: Normal rate, regular rhythm, normal heart sounds and intact distal pulses.  No murmur heard. Respiratory: Effort normal and breath sounds normal. No respiratory distress. He has no wheezes. He has no rales. He exhibits no tenderness.  GI: Soft. He exhibits no distension (large abdomen, but does not feel especially distended on exam) and no mass. There is tenderness (pain is equal both sides from the umbilicus down.  He is a little more tender LLQ than the rest of the lower abdomen.  No pain on palpation RUQ or LUQ). There is no rebound and no guarding.  Musculoskeletal: He exhibits no edema, tenderness or deformity.  Lymphadenopathy:    He has no cervical adenopathy.   Neurological: He is alert and oriented to person, place, and time. No cranial nerve deficit.  Skin: Skin is warm and dry. No rash noted. He is not diaphoretic. No erythema. No pallor.  + tatoo  Psychiatric: He has a normal mood and affect. His behavior is normal. Judgment and thought content normal.    Assessment/Plan:  Acute sigmoid diverticulitis with perforation/abscess/free air T12 compression fracture 09/2016 -ongoing pain BMI 43.3  Plan:  Medical management for now.  Watch progress quickly.  If he becomes worse repeat CT/possible surgery.  Hopefully pain and infection controlled by antibiotics, with follow up colonoscopy in 6-8 weeks.    Akina Maish 03/03/2018, 10:44 AM

## 2018-03-03 NOTE — Telephone Encounter (Signed)
MRI on hold at this time. 

## 2018-03-03 NOTE — ED Notes (Signed)
ED TO INPATIENT HANDOFF REPORT  Name/Age/Gender Paul Gilmore 42 y.o. male  Code Status    Code Status Orders  (From admission, onward)        Start     Ordered   03/03/18 1241  Full code  Continuous     03/03/18 1247    Code Status History    This patient has a current code status but no historical code status.      Home/SNF/Other Home  Chief Complaint SEVERE ABD PAIN  Level of Care/Admitting Diagnosis ED Disposition    ED Disposition Condition Comment   Admit  Hospital Area: [facility] [[id number]]  Level of Care: Med-Surg [16]  Diagnosis: Perforation of sigmoid colon due to diverticulitis [[id number]]  Admitting Physician: Liberal, [place]  Attending Physician: CCS, MD [[id number]]  Estimated length of stay: 5 - 7 days  Certification:: I certify this patient will need inpatient services for at least 2 midnights  PT Class (Do Not Modify): Inpatient [101]  PT Acc Code (Do Not Modify): Private [1]       Medical History History reviewed. No pertinent past medical history.  Allergies No Known Allergies  IV Location/Drains/Wounds Patient Lines/Drains/Airways Status   Active Line/Drains/Airways    Name:   Placement date:   Placement time:   Site:   Days:   Peripheral IV 03/03/18 Right Antecubital   03/03/18    0837    Antecubital   less than 1          Labs/Imaging Results for orders placed or performed during the hospital encounter of 03/03/18 (from the past 48 hour(s))  Lipase, blood     Status: None   Collection Time: 03/03/18  8:40 AM  Result Value Ref Range   Lipase 23 11 - 51 U/L    Comment: Performed at [facility], [address] [place], [place] [address]  Comprehensive metabolic panel     Status: Abnormal   Collection Time: 03/03/18  8:40 AM  Result Value Ref Range   Sodium 137 135 - 145 mmol/L   Potassium 4.1 3.5 - 5.1 mmol/L   Chloride 103 101 - 111 mmol/L   CO2 22 22 - 32 mmol/L   Glucose, Bld 152 (H) 65 - 99 mg/dL    BUN 20 6 - 20 mg/dL   Creatinine, Ser 1.16 0.61 - 1.24 mg/dL   Calcium 9.0 8.9 - 10.3 mg/dL   Total Protein 7.4 6.5 - 8.1 g/dL   Albumin 3.6 3.5 - 5.0 g/dL   AST 33 15 - 41 U/L   ALT 57 17 - 63 U/L   Alkaline Phosphatase 82 38 - 126 U/L   Total Bilirubin 0.2 (L) 0.3 - 1.2 mg/dL   GFR calc non Af Amer >60 >60 mL/min   GFR calc Af Amer >60 >60 mL/min    Comment: (NOTE) The eGFR has been calculated using the CKD EPI equation. This calculation has not been validated in all clinical situations. eGFR's persistently <60 mL/min signify possible Chronic Kidney Disease.    Anion gap 12 5 - 15    Comment: Performed at [facility], [address] [place], [place] [address]  CBC     Status: Abnormal   Collection Time: 03/03/18  8:40 AM  Result Value Ref Range   WBC 15.8 (H) 4.0 - 10.5 K/uL   RBC 5.45 4.22 - 5.81 MIL/uL   Hemoglobin 16.4 13.0 - 17.0 g/dL   HCT  47.8 39.0 - 52.0 %   MCV 87.7 78.0 - 100.0 fL   MCH 30.1 26.0 - 34.0 pg   MCHC 34.3 30.0 - 36.0 g/dL   RDW 12.3 11.5 - 15.5 %   Platelets 429 (H) 150 - 400 K/uL    Comment: Performed at [facility], [address] [place], [place] [address]  Urinalysis, Routine w reflex microscopic     Status: None   Collection Time: 03/03/18  8:40 AM  Result Value Ref Range   Color, Urine YELLOW YELLOW   APPearance CLEAR CLEAR   Specific Gravity, Urine 1.020 1.005 - 1.030   pH 6.0 5.0 - 8.0   Glucose, UA NEGATIVE NEGATIVE mg/dL   Hgb urine dipstick NEGATIVE NEGATIVE   Bilirubin Urine NEGATIVE NEGATIVE   Ketones, ur NEGATIVE NEGATIVE mg/dL   Protein, ur NEGATIVE NEGATIVE mg/dL   Nitrite NEGATIVE NEGATIVE   Leukocytes, UA NEGATIVE NEGATIVE    Comment: Microscopic not done on urines with negative protein, blood, leukocytes, nitrite, or glucose < 500 mg/dL. Performed at [facility], [address] [place], [place] [address]    Ct Abdomen Pelvis W Contrast  Result Date: 03/03/2018 CLINICAL DATA:   Abdominal pain for the past few days.  Diarrhea. EXAM: CT ABDOMEN AND PELVIS WITH CONTRAST TECHNIQUE: Multidetector CT imaging of the abdomen and pelvis was performed using the standard protocol following bolus administration of intravenous contrast. CONTRAST:  135m ISOVUE-300 IOPAMIDOL (ISOVUE-300) INJECTION 61% COMPARISON:  None. FINDINGS: Lower chest: The lung bases are clear of acute process. No pleural effusion or pulmonary lesions. The heart is normal in size. No pericardial effusion. The distal esophagus and aorta are unremarkable. Hepatobiliary: No focal hepatic lesions or intrahepatic biliary dilatation. Diffuse fatty infiltration of the liver. The gallbladder appears normal. Focal fatty sparing around the gallbladder fossa. No common bile duct dilatation. Pancreas: No mass, inflammation or ductal dilatation. Spleen: Normal size.  No focal lesions. Adrenals/Urinary Tract: The adrenal glands and kidneys are unremarkable. No ureteral or bladder calculi. No renal or bladder lesions. Stomach/Bowel: Acute complicated diverticulitis involving the sigmoid colon with a perforated diverticulum and developing abscess in the sigmoid mesocolon. There is also free air scattered throughout the abdomen. The stomach, duodenum, small bowel and remainder of the colon are unremarkable. There is age advanced descending and sigmoid colon diverticulosis. The terminal ileum and appendix are unremarkable. Vascular/Lymphatic: There are numerous small scattered retroperitoneal and sigmoid mesocolon lymph nodes which are likely inflammatory/hyperplastic. The major vascular structures are normal. Reproductive: The prostate gland and seminal vesicles are unremarkable. Other: Small amount of free fluid in the right pericolic gutter. Marked inflammation of the lower mesentery. Musculoskeletal: No significant bony findings. Stable T12 compression fracture and degenerative changes involving the hips. IMPRESSION: 1. 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 2. Diffuse fatty infiltration of the liver. 3. No mass or adenopathy. Electronically Signed   By: PMarijo SanesM.D.   On: 03/03/2018 10:18    Pending Labs Unresulted Labs (From admission, onward)   Start     Ordered   03/04/18 01583 Basic metabolic panel  Tomorrow morning,   R     03/03/18 1247   03/04/18 0500  CBC  Tomorrow morning,   R     03/03/18 1247   03/03/18 1241  HIV antibody (Routine Testing)  Once,   R  03/03/18 1247      Vitals/Pain Today's Vitals   03/03/18 1101 03/03/18 1230 03/03/18 1300 03/03/18 1330  BP: 122/86 137/85 (!) 145/81 118/76  Pulse: (!) 108 (!) 104 (!) 106 (!) 107  Resp: 20 (!) 26    Temp:      TempSrc:      SpO2: 95% 97% 98% 97%  Weight:      PainSc:        Isolation Precautions No active isolations  Medications Medications  dextrose 5 % and 0.9 % NaCl with KCl 20 mEq/L infusion ( Intravenous New Bag/Given 03/03/18 1321)  piperacillin-tazobactam (ZOSYN) IVPB 3.375 g (has no administration in time range)  acetaminophen (TYLENOL) tablet 650 mg (has no administration in time range)    Or  acetaminophen (TYLENOL) suppository 650 mg (has no administration in time range)  HYDROmorphone (DILAUDID) injection 1 mg (1 mg Intravenous Given 03/03/18 1327)  diphenhydrAMINE (BENADRYL) capsule 25 mg (has no administration in time range)    Or  diphenhydrAMINE (BENADRYL) injection 25 mg (has no administration in time range)  ondansetron (ZOFRAN-ODT) disintegrating tablet 4 mg (has no administration in time range)    Or  ondansetron (ZOFRAN) injection 4 mg (has no administration in time range)  enoxaparin (LOVENOX) injection 70 mg (has no administration in time range)  sodium chloride 0.9 % bolus 1,000 mL (0 mLs Intravenous Stopped 03/03/18 1000)  ketorolac (TORADOL) 15 MG/ML injection 15 mg  (15 mg Intravenous Given 03/03/18 0906)  HYDROmorphone (DILAUDID) injection 1 mg (1 mg Intravenous Given 03/03/18 0906)  ondansetron (ZOFRAN) injection 4 mg (4 mg Intravenous Given 03/03/18 0906)  iopamidol (ISOVUE-300) 61 % injection 100 mL (100 mLs Intravenous Contrast Given 03/03/18 0936)  HYDROmorphone (DILAUDID) injection 1 mg (1 mg Intravenous Given 03/03/18 1007)  piperacillin-tazobactam (ZOSYN) IVPB 3.375 g (0 g Intravenous Stopped 03/03/18 1050)  sodium chloride 0.9 % bolus 1,000 mL (0 mLs Intravenous Stopped 03/03/18 1113)    Mobility walks with assist

## 2018-03-03 NOTE — ED Triage Notes (Signed)
Pt c/o abdominal pain x2 days, states his bowels aren't regular and he feels full

## 2018-03-03 NOTE — Progress Notes (Signed)
PHARMACY NOTE -  Enoxaparin  Pharmacy has been assisting with dosing of Enoxaparin for VTE ppx.  Dosage remains stable at 0.5 mg/kg SQ q24 hr and need for further dosage adjustment appears unlikely at present given good renal function  Pharmacy will sign off, following peripherally for  dose adjustments. Please reconsult if a change in clinical status warrants re-evaluation of dosage.  Bernadene Personrew Avy Barlett, PharmD, BCPS [phone] 03/03/2018, 12:58 PM

## 2018-03-03 NOTE — ED Provider Notes (Addendum)
[facility] EMERGENCY DEPARTMENT Provider Note   CSN: [id number] Arrival date & time: 03/03/18  6578     History   Chief Complaint Chief Complaint  Patient presents with  . Abdominal Pain    HPI Paul Gilmore is a 42 y.o. male.  HPI   42 year old male with abdominal pain.  Gradual onset about 2 days ago.  Pain is across the lower abdomen.  Constant since onset.  He feels like it is progressively worsening.  Worse with movements.  Patient has been taking some pain medication that he was prescribed for back pain about a week ago.  He reports he has not had a regular bowel movement in several days he generally feels very bloated.  He also reports episodes of bright red blood per rectum about a month ago which has since resolved.  No urinary complaints.  No fevers or chills.  Nausea, but no vomiting.  Denies any past abdominal surgeries.  History reviewed. No pertinent past medical history.  Patient Active Problem List   Diagnosis Date Noted  . Right foot pain 06/14/2017  . Great toe pain, left 03/04/2017  . Lower back injury 09/24/2016  . Hand laceration 09/24/2016  . Neck pain 02/08/2014    History reviewed. No pertinent surgical history.      Home Medications    Prior to Admission medications   Medication Sig Start Date End Date Taking? Authorizing Provider  diazepam (VALIUM) 5 MG tablet Take 1 tablet (5 mg total) by mouth every 8 (eight) hours as needed for muscle spasms. 02/24/18   Hudnall, Azucena Fallen, MD  HYDROcodone-acetaminophen (NORCO) 5-325 MG tablet Take 1 tablet by mouth every 6 (six) hours as needed for moderate pain. 02/24/18   Hudnall, Azucena Fallen, MD  predniSONE (DELTASONE) 10 MG tablet 6 tabs po day 1, 5 tabs po day 2, 4 tabs po day 3, 3 tabs po day 4, 2 tabs po day 5, 1 tab po day 6 02/24/18   Hudnall, Azucena Fallen, MD    Family History No family history on file.  Social History Social History   Tobacco Use  . Smoking status: Never Smoker  . Smokeless  tobacco: Former Engineer, water Use Topics  . Alcohol use: No    Alcohol/week: 0.0 oz  . Drug use: Yes    Types: Marijuana    Comment: occasional     Allergies   Patient has no known allergies.   Review of Systems Review of Systems   All systems reviewed and negative, other than as noted in HPI.  Physical Exam Updated Vital Signs BP (!) 139/92 (BP Location: Right Arm)   Pulse 98   Temp 98.1 F (36.7 C) (Oral)   Resp 20   SpO2 100%   Physical Exam  Constitutional: He appears well-developed and well-nourished. No distress.  HENT:  Head: Normocephalic and atraumatic.  Eyes: Conjunctivae are normal. Right eye exhibits no discharge. Left eye exhibits no discharge.  Neck: Neck supple.  Cardiovascular: Normal rate, regular rhythm and normal heart sounds. Exam reveals no gallop and no friction rub.  No murmur heard. Pulmonary/Chest: Effort normal and breath sounds normal. No respiratory distress.  Abdominal: Soft. He exhibits no distension. There is tenderness.  Obese abdomen. Perhaps some mild distension. Significant tenderness across mid/lower abdomen. Doesn't lateralize. Voluntary guarding. No rebound.   Musculoskeletal: He exhibits no edema or tenderness.  Neurological: He is alert.  Skin: Skin is warm and dry.  Psychiatric: He has a normal  mood and affect. His behavior is normal. Thought content normal.  Nursing note and vitals reviewed.    ED Treatments / Results  Labs (all labs ordered are listed, but only abnormal results are displayed) Labs Reviewed  COMPREHENSIVE METABOLIC PANEL - Abnormal; Notable for the following components:      Result Value   Glucose, Bld 152 (*)    Total Bilirubin 0.2 (*)    All other components within normal limits  CBC - Abnormal; Notable for the following components:   WBC 15.8 (*)    Platelets 429 (*)    All other components within normal limits  LIPASE, BLOOD  URINALYSIS, ROUTINE W REFLEX MICROSCOPIC     EKG None  Radiology Ct Abdomen Pelvis W Contrast  Result Date: 03/03/2018 CLINICAL DATA:  Abdominal pain for the past few days.  Diarrhea. EXAM: CT ABDOMEN AND PELVIS WITH CONTRAST TECHNIQUE: Multidetector CT imaging of the abdomen and pelvis was performed using the standard protocol following bolus administration of intravenous contrast. CONTRAST:  100mL ISOVUE-300 IOPAMIDOL (ISOVUE-300) INJECTION 61% COMPARISON:  None. FINDINGS: Lower chest: The lung bases are clear of acute process. No pleural effusion or pulmonary lesions. The heart is normal in size. No pericardial effusion. The distal esophagus and aorta are unremarkable. Hepatobiliary: No focal hepatic lesions or intrahepatic biliary dilatation. Diffuse fatty infiltration of the liver. The gallbladder appears normal. Focal fatty sparing around the gallbladder fossa. No common bile duct dilatation. Pancreas: No mass, inflammation or ductal dilatation. Spleen: Normal size.  No focal lesions. Adrenals/Urinary Tract: The adrenal glands and kidneys are unremarkable. No ureteral or bladder calculi. No renal or bladder lesions. Stomach/Bowel: Acute complicated diverticulitis involving the sigmoid colon with a perforated diverticulum and developing abscess in the sigmoid mesocolon. There is also free air scattered throughout the abdomen. The stomach, duodenum, small bowel and remainder of the colon are unremarkable. There is age advanced descending and sigmoid colon diverticulosis. The terminal ileum and appendix are unremarkable. Vascular/Lymphatic: There are numerous small scattered retroperitoneal and sigmoid mesocolon lymph nodes which are likely inflammatory/hyperplastic. The major vascular structures are normal. Reproductive: The prostate gland and seminal vesicles are unremarkable. Other: Small amount of free fluid in the right pericolic gutter. Marked inflammation of the lower mesentery. Musculoskeletal: No significant bony findings. Stable T12  compression fracture and degenerative changes involving the hips. IMPRESSION: 1. 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 2. Diffuse fatty infiltration of the liver. 3. No mass or adenopathy. Electronically Signed   By: Rudie MeyerP.  Gallerani M.D.   On: 03/03/2018 10:18    Procedures Procedures (including critical care time)  CRITICAL CARE Performed by: Raeford RazorStephen Icie Kuznicki Total critical care time: 35 minutes Critical care time was exclusive of separately billable procedures and treating other patients. Critical care was necessary to treat or prevent imminent or life-threatening deterioration. Critical care was time spent personally by me on the following activities: development of treatment plan with patient and/or surrogate as well as nursing, discussions with consultants, evaluation of patient's response to treatment, examination of patient, obtaining history from patient or surrogate, ordering and performing treatments and interventions, ordering and review of laboratory studies, ordering and review of radiographic studies, pulse oximetry and re-evaluation of patient's condition.   Medications Ordered in ED Medications  piperacillin-tazobactam (ZOSYN) IVPB 3.375 g (3.375 g Intravenous New Bag/Given 03/03/18 1021)  sodium chloride 0.9 % bolus 1,000 mL (has no administration in time  range)  sodium chloride 0.9 % bolus 1,000 mL (0 mLs Intravenous Stopped 03/03/18 1000)  ketorolac (TORADOL) 15 MG/ML injection 15 mg (15 mg Intravenous Given 03/03/18 0906)  HYDROmorphone (DILAUDID) injection 1 mg (1 mg Intravenous Given 03/03/18 0906)  ondansetron (ZOFRAN) injection 4 mg (4 mg Intravenous Given 03/03/18 0906)  iopamidol (ISOVUE-300) 61 % injection 100 mL (100 mLs Intravenous Contrast Given 03/03/18 0936)  HYDROmorphone (DILAUDID) injection 1 mg (1 mg Intravenous Given  03/03/18 1007)     Initial Impression / Assessment and Plan / ED Course  I have reviewed the triage vital signs and the nursing notes.  Pertinent labs & imaging results that were available during my care of the patient were reviewed by me and considered in my medical decision making (see chart for details).  Clinical Course as of Mar 03 1417  Mon Mar 03, 2018  1157 Pt evaluated upon arrival. Reports some inc in pain. Abd is distended, without guarding. Gen Surgery PA Marlyne Beards in ED to see patient.   [JR]    Clinical Course User Index [JR] Robinson, [place] N, PA-C    42 year old male with perforated diverticulitis. Zosyn.  Discussed with Dr. Sheliah Hatch, general surgery.  Requesting transfer to the emergency room at [facility].  Discussed with Dr. Rodena Medin, ED provider.  Patient and family updated on diagnosis and the plan.  Final Clinical Impressions(s) / ED Diagnoses   Final diagnoses:  Diverticulitis of large intestine with perforation and abscess without bleeding    ED Discharge Orders    None       Raeford Razor, MD 03/03/18 1038    Raeford Razor, MD 03/03/18 1524

## 2018-03-03 NOTE — Telephone Encounter (Signed)
Patient would like to go ahead with MRI. He is in a lot of pain. Requesting to extend work note until he can get an MRI and figure out what is going on. States he is currently in the ED downstairs

## 2018-03-03 NOTE — Telephone Encounter (Signed)
Please go ahead with MRI of his lumbar spine.

## 2018-03-03 NOTE — ED Provider Notes (Signed)
[facility]-EMERGENCY DEPT Provider Note   CSN: [id number] Arrival date & time: 03/03/18  7829     History   Chief Complaint Chief Complaint  Patient presents with  . Abdominal Pain    HPI Paul Gilmore is a 42 y.o. male presenting to the ED via [facility] from [facility] ED with diagnosed perforated diverticulitis.  Patient to this ED for general surgery consult.  Dr. Sheliah Hatch is accepting physician, with plan for admission versus surgery today.  Prior to arrival to this ED, patient's pain managed with Dilaudid.  IV Zosyn also administered.  Labs significant for leukocytosis of 15.8.  Patient was afebrile and hemodynamically stable.  On arrival to this ED, patient reporting some mild increase in discomfort regarding abdominal pain attributed to bumpy ambulance ride, though denies any market increase in pain.  Also denies nausea at this time.  Patient aware of plan for surgery to evaluate patient and decide treatment plan.  Briefly, patient presented to [facility] ED for 2 days of gradually worsening abdominal pain with associated bloating. No hx of abdominal surgeries.  The history is provided by the patient and medical records.    History reviewed. No pertinent past medical history.  Patient Active Problem List   Diagnosis Date Noted  . Right foot pain 06/14/2017  . Great toe pain, left 03/04/2017  . Lower back injury 09/24/2016  . Hand laceration 09/24/2016  . Neck pain 02/08/2014    History reviewed. No pertinent surgical history.      Home Medications    Prior to Admission medications   Medication Sig Start Date End Date Taking? Authorizing Provider  ibuprofen (ADVIL,MOTRIN) 200 MG tablet Take 800 mg by mouth every 6 (six) hours as needed (pain).   Yes [provider]  predniSONE (DELTASONE) 10 MG tablet 6 tabs po day 1, 5 tabs po day 2, 4 tabs po day 3, 3 tabs po day 4, 2 tabs po day 5, 1 tab po day 6 02/24/18  Yes  Hudnall, Azucena Fallen, MD  diazepam (VALIUM) 5 MG tablet Take 1 tablet (5 mg total) by mouth every 8 (eight) hours as needed for muscle spasms. Patient not taking: Reported on 03/03/2018 02/24/18   Lenda Kelp, MD  HYDROcodone-acetaminophen (NORCO) 5-325 MG tablet Take 1 tablet by mouth every 6 (six) hours as needed for moderate pain. Patient not taking: Reported on 03/03/2018 02/24/18   Lenda Kelp, MD    Family History No family history on file.  Social History Social History   Tobacco Use  . Smoking status: Never Smoker  . Smokeless tobacco: Former Engineer, water Use Topics  . Alcohol use: No    Alcohol/week: 0.0 oz  . Drug use: Yes    Types: Marijuana    Comment: occasional     Allergies   Patient has no known allergies.   Review of Systems Review of Systems  Gastrointestinal: Positive for abdominal distention, abdominal pain and nausea. Negative for vomiting.  All other systems reviewed and are negative.    Physical Exam Updated Vital Signs BP 122/86   Pulse (!) 108   Temp 98.1 F (36.7 C) (Oral)   Resp 20   Wt (!) 145.2 kg (320 lb)   SpO2 95%   BMI 43.40 kg/m   Physical Exam  Constitutional: He appears well-developed and well-nourished.  Non-toxic appearance.  HENT:  Head: Normocephalic and atraumatic.  Eyes: Conjunctivae are normal.  Cardiovascular: Regular rhythm and intact  distal pulses.  Mildly tachycardic.  Pulmonary/Chest: Effort normal and breath sounds normal. No respiratory distress.  Abdominal: Soft. He exhibits distension. There is tenderness. There is no guarding.  Neurological: He is alert.  Skin: Skin is warm.  Psychiatric: He has a normal mood and affect. His behavior is normal.  Nursing note and vitals reviewed.    ED Treatments / Results  Labs (all labs ordered are listed, but only abnormal results are displayed) Labs Reviewed  COMPREHENSIVE METABOLIC PANEL - Abnormal; Notable for the following components:      Result Value    Glucose, Bld 152 (*)    Total Bilirubin 0.2 (*)    All other components within normal limits  CBC - Abnormal; Notable for the following components:   WBC 15.8 (*)    Platelets 429 (*)    All other components within normal limits  LIPASE, BLOOD  URINALYSIS, ROUTINE W REFLEX MICROSCOPIC    EKG None  Radiology Ct Abdomen Pelvis W Contrast  Result Date: 03/03/2018 CLINICAL DATA:  Abdominal pain for the past few days.  Diarrhea. EXAM: CT ABDOMEN AND PELVIS WITH CONTRAST TECHNIQUE: Multidetector CT imaging of the abdomen and pelvis was performed using the standard protocol following bolus administration of intravenous contrast. CONTRAST:  100mL ISOVUE-300 IOPAMIDOL (ISOVUE-300) INJECTION 61% COMPARISON:  None. FINDINGS: Lower chest: The lung bases are clear of acute process. No pleural effusion or pulmonary lesions. The heart is normal in size. No pericardial effusion. The distal esophagus and aorta are unremarkable. Hepatobiliary: No focal hepatic lesions or intrahepatic biliary dilatation. Diffuse fatty infiltration of the liver. The gallbladder appears normal. Focal fatty sparing around the gallbladder fossa. No common bile duct dilatation. Pancreas: No mass, inflammation or ductal dilatation. Spleen: Normal size.  No focal lesions. Adrenals/Urinary Tract: The adrenal glands and kidneys are unremarkable. No ureteral or bladder calculi. No renal or bladder lesions. Stomach/Bowel: Acute complicated diverticulitis involving the sigmoid colon with a perforated diverticulum and developing abscess in the sigmoid mesocolon. There is also free air scattered throughout the abdomen. The stomach, duodenum, small bowel and remainder of the colon are unremarkable. There is age advanced descending and sigmoid colon diverticulosis. The terminal ileum and appendix are unremarkable. Vascular/Lymphatic: There are numerous small scattered retroperitoneal and sigmoid mesocolon lymph nodes which are likely  inflammatory/hyperplastic. The major vascular structures are normal. Reproductive: The prostate gland and seminal vesicles are unremarkable. Other: Small amount of free fluid in the right pericolic gutter. Marked inflammation of the lower mesentery. Musculoskeletal: No significant bony findings. Stable T12 compression fracture and degenerative changes involving the hips. IMPRESSION: 1. 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 2. Diffuse fatty infiltration of the liver. 3. No mass or adenopathy. Electronically Signed   By: Rudie MeyerP.  Gallerani M.D.   On: 03/03/2018 10:18    Procedures Procedures (including critical care time)  Medications Ordered in ED Medications  sodium chloride 0.9 % bolus 1,000 mL (0 mLs Intravenous Stopped 03/03/18 1000)  ketorolac (TORADOL) 15 MG/ML injection 15 mg (15 mg Intravenous Given 03/03/18 0906)  HYDROmorphone (DILAUDID) injection 1 mg (1 mg Intravenous Given 03/03/18 0906)  ondansetron (ZOFRAN) injection 4 mg (4 mg Intravenous Given 03/03/18 0906)  iopamidol (ISOVUE-300) 61 % injection 100 mL (100 mLs Intravenous Contrast Given 03/03/18 0936)  HYDROmorphone (DILAUDID) injection 1 mg (1 mg Intravenous Given 03/03/18 1007)  piperacillin-tazobactam (ZOSYN) IVPB 3.375 g (0 g Intravenous Stopped 03/03/18  1050)  sodium chloride 0.9 % bolus 1,000 mL (0 mLs Intravenous Stopped 03/03/18 1113)     Initial Impression / Assessment and Plan / ED Course  I have reviewed the triage vital signs and the nursing notes.  Pertinent labs & imaging results that were available during my care of the patient were reviewed by me and considered in my medical decision making (see chart for details).  Clinical Course as of Mar 03 1230  Mon Mar 03, 2018  1157 Pt evaluated upon arrival. Reports some inc in pain. Abd is distended, without guarding. Gen Surgery PA  Marlyne Beards in ED to see patient.   [JR]    Clinical Course User Index [JR] Robinson, [place] N, PA-C    Pt presenting to this ED as transfer from [facility] ED with diagnosed perforated diverticulitis. Afebrile, nontoxic-appearing, abdomen with tenderness in lower quadrants. Dr. Sheliah Hatch with General Surgery consulted prior to transfer. PA Marlyne Beards evaluated patient in this ED. Per his note, plan for admission with medical management and observation.   The patient appears reasonably stabilized for admission considering the current resources, flow, and capabilities available in the ED at this time, and I doubt any other [facility] requiring further screening and/or treatment in the ED prior to admission.  Final Clinical Impressions(s) / ED Diagnoses   Final diagnoses:  Diverticulitis of large intestine with perforation and abscess without bleeding    ED Discharge Orders    None       Robinson, [place] N, PA-C 03/03/18 1303    Wynetta Fines, MD 03/03/18 [phone]

## 2018-03-03 NOTE — ED Notes (Signed)
Patient BIB [facility] with complaints of abdominal pain x3 days without bowel movement and perforated bowel. Patient here for surgery consult. Dr Feliciana RossettiLuke Kinsinger accepting physician. Patient received 2L IV NS, Zosyn 3.375g IV, 4mg  Zofran IV at 0906, and 2mg  IV Dilaudid - last dose at 1007. Patient AxOx4 in triage.

## 2018-03-03 NOTE — H&P (Signed)
Reason for Consult: Diverticulitis with free air Referring Physician: Virgel Manifold CC:  Abdominal pain for 2 days, bloating and constipation Primary care:  None, followed by Karlton Lemon for his back  Paul Gilmore is an 42 y.o. male.  HPI: Presented to the ED at [facility] this a.m. with complaints of 2 days of abdominal pain, bloating, and constipation.  Patient has had a mild T12 compression fracture been present since 09/26/16.  He is on chronic pain medications for this.  He has used these medications to treat his abdominal pain without success at home.  Workup in the ED shows: He is afebrile but hypertensive.  Glucose is 152 CMP is otherwise normal.,  WBC 15.8, hemoglobin 16.4, hematocrit 47.8, platelets 429,000.  Urinalysis is normal.  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 There is scattered free air throughout the abdomen.  Additionally there is diffuse fatty infiltration of the liver.  Patient is being transferred to [facility] emergency department for further evaluation and treatment.  Treatment in the ED includes Zosyn, Toradol, Dilaudid, and Zofran.   Of note repeat films on 02/24/18 of the lumbar spine shows stable T12 compression deformity noted.  Vertebral height was well-maintained disc space narrowing is noted in L4-L5 and L5-S1.  History reviewed. No pertinent past medical history. None  History reviewed. No pertinent surgical history. None  No family history on file.  Social History:  reports that he has never smoked. He has quit using smokeless tobacco. He reports that he has current or past drug history. Drug: Marijuana. He reports that he does not drink alcohol. Tobacco:  None ETOH:  Some use Drugs:  None Married  3 children 20/17/14 - Works for [employer] and lives with wife and children.      Allergies: No Known Allergies  Medications:         Prior to Admission medications   Medication Sig Start Date End Date Taking? Authorizing Provider  diazepam (VALIUM) 5 MG tablet Take 1 tablet (5 mg total) by mouth every 8 (eight) hours as needed for muscle spasms. 02/24/18   Hudnall, Sharyn Lull, MD  HYDROcodone-acetaminophen (NORCO) 5-325 MG tablet Take 1 tablet by mouth every 6 (six) hours as needed for moderate pain. 02/24/18   Hudnall, Sharyn Lull, MD  predniSONE (DELTASONE) 10 MG tablet 6 tabs po day 1, 5 tabs po day 2, 4 tabs po day 3, 3 tabs po day 4, 2 tabs po day 5, 1 tab po day 6 02/24/18   Hudnall, Sharyn Lull, MD   Continuous:            Anti-infectives (From admission, onward)   Start     Dose/Rate Route Frequency Ordered Stop   03/03/18 1015  piperacillin-tazobactam (ZOSYN) IVPB 3.375 g     3.375 g 100 mL/hr over 30 Minutes Intravenous  Once 03/03/18 1012 03/03/18 1050      LabResultsLast48Hours        Results for orders placed or performed during the hospital encounter of 03/03/18 (from the past 48 hour(s))  Lipase, blood     Status: None   Collection Time: 03/03/18  8:40 AM  Result Value Ref Range   Lipase 23 11 - 51 U/L    Comment: Performed at [facility], [address] [place], [place] [address]  Comprehensive metabolic panel  Status: Abnormal   Collection Time: 03/03/18  8:40 AM  Result Value Ref Range   Sodium 137 135 - 145 mmol/L   Potassium 4.1 3.5 - 5.1 mmol/L   Chloride 103 101 - 111 mmol/L   CO2 22 22 - 32 mmol/L   Glucose, Bld 152 (H) 65 - 99 mg/dL   BUN 20 6 - 20 mg/dL   Creatinine, Ser 1.16 0.61 - 1.24 mg/dL   Calcium 9.0 8.9 - 10.3 mg/dL   Total Protein 7.4 6.5 - 8.1 g/dL   Albumin 3.6 3.5 - 5.0 g/dL   AST 33 15 - 41 U/L   ALT 57 17 - 63 U/L   Alkaline Phosphatase 82 38 - 126 U/L   Total Bilirubin 0.2 (L) 0.3 - 1.2 mg/dL   GFR calc non Af Amer >60 >60 mL/min   GFR calc Af Amer >60 >60 mL/min     Comment: (NOTE) The eGFR has been calculated using the CKD EPI equation. This calculation has not been validated in all clinical situations. eGFR's persistently <60 mL/min signify possible Chronic Kidney Disease.    Anion gap 12 5 - 15    Comment: Performed at [facility], [address] [place], [place] [address]  CBC     Status: Abnormal   Collection Time: 03/03/18  8:40 AM  Result Value Ref Range   WBC 15.8 (H) 4.0 - 10.5 K/uL   RBC 5.45 4.22 - 5.81 MIL/uL   Hemoglobin 16.4 13.0 - 17.0 g/dL   HCT 47.8 39.0 - 52.0 %   MCV 87.7 78.0 - 100.0 fL   MCH 30.1 26.0 - 34.0 pg   MCHC 34.3 30.0 - 36.0 g/dL   RDW 12.3 11.5 - 15.5 %   Platelets 429 (H) 150 - 400 K/uL    Comment: Performed at [facility], [address] [place], [place] [address]  Urinalysis, Routine w reflex microscopic     Status: None   Collection Time: 03/03/18  8:40 AM  Result Value Ref Range   Color, Urine YELLOW YELLOW   APPearance CLEAR CLEAR   Specific Gravity, Urine 1.020 1.005 - 1.030   pH 6.0 5.0 - 8.0   Glucose, UA NEGATIVE NEGATIVE mg/dL   Hgb urine dipstick NEGATIVE NEGATIVE   Bilirubin Urine NEGATIVE NEGATIVE   Ketones, ur NEGATIVE NEGATIVE mg/dL   Protein, ur NEGATIVE NEGATIVE mg/dL   Nitrite NEGATIVE NEGATIVE   Leukocytes, UA NEGATIVE NEGATIVE    Comment: Microscopic not done on urines with negative protein, blood, leukocytes, nitrite, or glucose < 500 mg/dL. Performed at [facility], [address] [place], [place] [address]        ImagingResults(Last48hours)  Ct Abdomen Pelvis W Contrast  Result Date: 03/03/2018 CLINICAL DATA:  Abdominal pain for the past few days.  Diarrhea. EXAM: CT ABDOMEN AND PELVIS WITH CONTRAST TECHNIQUE: Multidetector CT imaging of the abdomen and pelvis was performed using the standard protocol following bolus administration of intravenous contrast. CONTRAST:  157m ISOVUE-300 IOPAMIDOL  (ISOVUE-300) INJECTION 61% COMPARISON:  None. FINDINGS: Lower chest: The lung bases are clear of acute process. No pleural effusion or pulmonary lesions. The heart is normal in size. No pericardial effusion. The distal esophagus and aorta are unremarkable. Hepatobiliary: No focal hepatic lesions or intrahepatic biliary dilatation. Diffuse fatty infiltration of the liver. The gallbladder appears normal. Focal fatty sparing around the gallbladder fossa. No common bile duct dilatation. Pancreas: No mass, inflammation or ductal dilatation.  Spleen: Normal size.  No focal lesions. Adrenals/Urinary Tract: The adrenal glands and kidneys are unremarkable. No ureteral or bladder calculi. No renal or bladder lesions. Stomach/Bowel: Acute complicated diverticulitis involving the sigmoid colon with a perforated diverticulum and developing abscess in the sigmoid mesocolon. There is also free air scattered throughout the abdomen. The stomach, duodenum, small bowel and remainder of the colon are unremarkable. There is age advanced descending and sigmoid colon diverticulosis. The terminal ileum and appendix are unremarkable. Vascular/Lymphatic: There are numerous small scattered retroperitoneal and sigmoid mesocolon lymph nodes which are likely inflammatory/hyperplastic. The major vascular structures are normal. Reproductive: The prostate gland and seminal vesicles are unremarkable. Other: Small amount of free fluid in the right pericolic gutter. Marked inflammation of the lower mesentery. Musculoskeletal: No significant bony findings. Stable T12 compression fracture and degenerative changes involving the hips. IMPRESSION: 1. 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 2. Diffuse fatty infiltration of the liver. 3. No mass or adenopathy. Electronically Signed   By: Marijo Sanes M.D.    On: 03/03/2018 10:18     Review of Systems  Constitutional: Negative.   HENT: Negative.   Eyes: Negative.   Respiratory: Negative.        He snores and is restless at night, but no dx of sleep apnea.  Cardiovascular: Negative.   Gastrointestinal: Positive for abdominal pain and blood in stool (occasional on toilet paper). Negative for constipation, diarrhea, melena and vomiting. Nausea: this AM, better with zofran.       Some soft stool after laxative, but no diarrhea or constipation  Genitourinary: Negative.   Musculoskeletal: Positive for back pain (Back pain for the last year.  Primary was anticipating  repeat MRI). Negative for falls and joint pain.  Skin: Negative.   Neurological: Negative.   Endo/Heme/Allergies: Negative.   Psychiatric/Behavioral: Negative.    Blood pressure (!) 139/92, pulse 98, temperature 98.1 F (36.7 C), temperature source Oral, resp. rate 20, SpO2 100 %. Physical Exam  Constitutional: He is oriented to person, place, and time. He appears well-developed and well-nourished. No distress.  72 inches  320 lbs  BMI ~ 43.3  HENT:  Head: Normocephalic and atraumatic.  Mouth/Throat: Oropharynx is clear and moist. No oropharyngeal exudate.  Eyes: Right eye exhibits no discharge. Left eye exhibits no discharge. No scleral icterus.  Pupils are equal  Neck: Normal range of motion. Neck supple. No JVD present. No tracheal deviation present. No thyromegaly present.  Cardiovascular: Normal rate, regular rhythm, normal heart sounds and intact distal pulses.  No murmur heard. Respiratory: Effort normal and breath sounds normal. No respiratory distress. He has no wheezes. He has no rales. He exhibits no tenderness.  GI: Soft. He exhibits no distension (large abdomen, but does not feel especially distended on exam) and no mass. There is tenderness (pain is equal both sides from the umbilicus down.  He is a little more tender LLQ than the rest of the lower abdomen.  No  pain on palpation RUQ or LUQ). There is no rebound and no guarding.  Musculoskeletal: He exhibits no edema, tenderness or deformity.  Lymphadenopathy:    He has no cervical adenopathy.  Neurological: He is alert and oriented to person, place, and time. No cranial nerve deficit.  Skin: Skin is warm and dry. No rash noted. He is not diaphoretic. No erythema. No pallor.  + tatoo  Psychiatric: He has a  normal mood and affect. His behavior is normal. Judgment and thought content normal.    Assessment/Plan:  Acute sigmoid diverticulitis with perforation/abscess/free air T12 compression fracture 09/2016 -ongoing pain repeat MRI being considered by Primary BMI 43.3  Plan:  Medical management for now.  Watch progress quickly.  If he becomes worse repeat CT/possible surgery.  Hopefully pain and infection controlled by antibiotics, with follow up colonoscopy in 6-8 weeks.    Dianely Krehbiel 03/03/2018, 10:44 AM

## 2018-03-04 LAB — BASIC METABOLIC PANEL
Anion gap: 12 (ref 5–15)
BUN: 12 mg/dL (ref 6–20)
CALCIUM: 8.6 mg/dL — AB (ref 8.9–10.3)
CO2: 24 mmol/L (ref 22–32)
CREATININE: 0.91 mg/dL (ref 0.61–1.24)
Chloride: 100 mmol/L — ABNORMAL LOW (ref 101–111)
GFR calc non Af Amer: >60 mL/min (ref >60)
Glucose, Bld: 115 mg/dL — ABNORMAL HIGH (ref 65–99)
Potassium: 4.4 mmol/L (ref 3.5–5.1)
Sodium: 136 mmol/L (ref 135–145)

## 2018-03-04 LAB — CBC
HCT: 47.2 % (ref 39.0–52.0)
HEMOGLOBIN: 15.4 g/dL (ref 13.0–17.0)
MCH: 30 pg (ref 26.0–34.0)
MCHC: 32.6 g/dL (ref 30.0–36.0)
MCV: 92 fL (ref 78.0–100.0)
PLATELETS: 445 10*3/uL — AB (ref 150–400)
RBC: 5.13 MIL/uL (ref 4.22–5.81)
RDW: 12.5 % (ref 11.5–15.5)
WBC: 19.6 10*3/uL — ABNORMAL HIGH (ref 4.0–10.5)

## 2018-03-04 LAB — HIV ANTIBODY (ROUTINE TESTING W REFLEX): HIV SCREEN 4TH GENERATION: NONREACTIVE

## 2018-03-04 MED ORDER — GABAPENTIN 300 MG PO CAPS
300.0000 mg | ORAL_CAPSULE | Freq: Two times a day (BID) | ORAL | Status: DC
  Administered 2018-03-04 – 2018-03-15 (×24): 300 mg via ORAL
  Filled 2018-03-04 (×24): qty 1

## 2018-03-04 MED ORDER — OXYCODONE HCL 5 MG PO TABS
5.0000 mg | ORAL_TABLET | ORAL | Status: DC | PRN
  Administered 2018-03-04 – 2018-03-15 (×16): 10 mg via ORAL
  Filled 2018-03-04 (×17): qty 2

## 2018-03-04 MED ORDER — ACETAMINOPHEN 500 MG PO TABS
1000.0000 mg | ORAL_TABLET | Freq: Three times a day (TID) | ORAL | Status: DC
  Administered 2018-03-04 – 2018-03-15 (×34): 1000 mg via ORAL
  Filled 2018-03-04 (×34): qty 2

## 2018-03-04 MED ORDER — HYDROMORPHONE HCL 1 MG/ML IJ SOLN
1.0000 mg | INTRAMUSCULAR | Status: DC | PRN
  Administered 2018-03-04 – 2018-03-07 (×13): 1 mg via INTRAVENOUS
  Filled 2018-03-04 (×13): qty 1

## 2018-03-04 MED ORDER — KETOROLAC TROMETHAMINE 15 MG/ML IJ SOLN
15.0000 mg | Freq: Three times a day (TID) | INTRAMUSCULAR | Status: AC | PRN
  Administered 2018-03-04: 15 mg via INTRAVENOUS
  Filled 2018-03-04: qty 1

## 2018-03-04 MED ORDER — HYDROMORPHONE HCL 1 MG/ML IJ SOLN
1.0000 mg | INTRAMUSCULAR | Status: DC | PRN
  Administered 2018-03-04 (×3): 2 mg via INTRAVENOUS
  Filled 2018-03-04 (×3): qty 2

## 2018-03-04 MED ORDER — HYDROMORPHONE HCL 1 MG/ML IJ SOLN: 1.0000 mg | INTRAMUSCULAR | Status: DC | PRN

## 2018-03-04 MED ORDER — METHOCARBAMOL 1000 MG/10ML IJ SOLN
1000.0000 mg | Freq: Four times a day (QID) | INTRAVENOUS | Status: DC | PRN
  Filled 2018-03-04: qty 10

## 2018-03-04 NOTE — Progress Notes (Signed)
[facility]/Trauma Progress Note      Assessment/Plan  Hx chronic back pain, T12 compression fracture  Acutesigmoid diverticulitis with perforation/abscess/free air - 1st episode of diverticulitis, no hx of colonoscopy - hopefully this will resolve without need for surgery   FEN: NPO, IVF, allow water and ice chips, sips with meds VTE: SCD's, lovenox ID: Zosyn 04/08>>   WBC up to 19.6 from 15.8 yesterday Foley: none  Follow up: TBD  DISPO: continue IVF, IV abx, NPO, CBC AM    LOS: 1 day    Subjective: CC: abdominal pain  Pain improved from yesterday. No fever or chills overnight. No nausea or vomiting. Wife at bedside. Pt states bloating is improved. He had a large episode of diarrhea. Encouraged ambulation. Pt just had 2mg  of dilaudid.  Objective: Vital signs in last 24 hours: Temp:  [97.9 F (36.6 C)-98.6 F (37 C)] 98.2 F (36.8 C) (04/09 0701) Pulse Rate:  [92-112] 92 (04/09 0701) Resp:  [18-26] 18 (04/08 2028) BP: (95-158)/(63-91) 114/63 (04/09 0701) SpO2:  [95 %-98 %] 97 % (04/09 0701) Weight:  [140.3 kg (309 lb 6.4 oz)-145.2 kg (320 lb)] 140.3 kg (309 lb 6.4 oz) (04/09 0846) Last BM Date: 03/03/18  Intake/Output from previous day: 04/08 0701 - 04/09 0700 In: 2206.3 [I.V.:206.3; IV Piggyback:2000] Out: -  Intake/Output this shift: No intake/output data recorded.  PE: Gen:  Alert, NAD, pleasant, cooperative, obese Card:  RRR, no M/G/R heard Pulm:  CTA, no W/R/R, effort normal Abd: Soft, obese, not distended, +BS, TTP in lower abdomen, pt had 2mg  of dilaudid just before my exam Skin: no rashes noted, warm and dry   Anti-infectives: Anti-infectives (From admission, onward)   Start     Dose/Rate Route Frequency Ordered Stop   03/03/18 1800  piperacillin-tazobactam (ZOSYN) IVPB 3.375 g     3.375 g 12.5 mL/hr over 240 Minutes Intravenous Every 8 hours 03/03/18 1247     03/03/18 1015  piperacillin-tazobactam (ZOSYN) IVPB 3.375 g     3.375  g 100 mL/hr over 30 Minutes Intravenous  Once 03/03/18 1012 03/03/18 1050      Lab Results:  Recent Labs    03/03/18 0840 03/04/18 0614  WBC 15.8* 19.6*  HGB 16.4 15.4  HCT 47.8 47.2  PLT 429* 445*   BMET Recent Labs    03/03/18 0840 03/04/18 0614  NA 137 136  K 4.1 4.4  CL 103 100*  CO2 22 24  GLUCOSE 152* 115*  BUN 20 12  CREATININE 1.16 0.91  CALCIUM 9.0 8.6*   PT/INR No results for input(s): LABPROT, INR in the last 72 hours. CMP     Component Value Date/Time   NA 136 03/04/2018 0614   K 4.4 03/04/2018 0614   CL 100 (L) 03/04/2018 0614   CO2 24 03/04/2018 0614   GLUCOSE 115 (H) 03/04/2018 0614   BUN 12 03/04/2018 0614   CREATININE 0.91 03/04/2018 0614   CALCIUM 8.6 (L) 03/04/2018 0614   PROT 7.4 03/03/2018 0840   ALBUMIN 3.6 03/03/2018 0840   AST 33 03/03/2018 0840   ALT 57 03/03/2018 0840   ALKPHOS 82 03/03/2018 0840   BILITOT 0.2 (L) 03/03/2018 0840   GFRNONAA >60 03/04/2018 0614   GFRAA >60 03/04/2018 0614   Lipase     Component Value Date/Time   LIPASE 23 03/03/2018 0840    Studies/Results: Ct Abdomen Pelvis W Contrast  Result Date: 03/03/2018 CLINICAL DATA:  Abdominal pain for the past few days.  Diarrhea. EXAM:  CT ABDOMEN AND PELVIS WITH CONTRAST TECHNIQUE: Multidetector CT imaging of the abdomen and pelvis was performed using the standard protocol following bolus administration of intravenous contrast. CONTRAST:  ISOVUE-300 IOPAMIDOL (ISOVUE-300) INJECTION 61% COMPARISON:  None. FINDINGS: Lower chest: The lung bases are clear of acute process. No pleural effusion or pulmonary lesions. The heart is normal in size. No pericardial effusion. The distal esophagus and aorta are unremarkable. Hepatobiliary: No focal hepatic lesions or intrahepatic biliary dilatation. Diffuse fatty infiltration of the liver. The gallbladder appears normal. Focal fatty sparing around the gallbladder fossa. No common bile duct dilatation. Pancreas: No mass,  inflammation or ductal dilatation. Spleen: Normal size.  No focal lesions. Adrenals/Urinary Tract: The adrenal glands and kidneys are unremarkable. No ureteral or bladder calculi. No renal or bladder lesions. Stomach/Bowel: Acute complicated diverticulitis involving the sigmoid colon with a perforated diverticulum and developing abscess in the sigmoid mesocolon. There is also free air scattered throughout the abdomen. The stomach, duodenum, small bowel and remainder of the colon are unremarkable. There is age advanced descending and sigmoid colon diverticulosis. The terminal ileum and appendix are unremarkable. Vascular/Lymphatic: There are numerous small scattered retroperitoneal and sigmoid mesocolon lymph nodes which are likely inflammatory/hyperplastic. The major vascular structures are normal. Reproductive: The prostate gland and seminal vesicles are unremarkable. Other: Small amount of free fluid in the right pericolic gutter. Marked inflammation of the lower mesentery. Musculoskeletal: No significant bony findings. Stable T12 compression fracture and degenerative changes involving the hips. IMPRESSION: 1. 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 2. Diffuse fatty infiltration of the liver. 3. No mass or adenopathy. Electronically Signed   By: Rudie Meyer M.D.   On: 03/03/2018 10:18      Jerre Simon , [facility] 03/04/2018, 8:50 AM  Pager: [phone] Mon-Wed, Friday 7:00am-4:30pm Thurs 7am-11:30am  Consults: [phone]

## 2018-03-05 LAB — CBC
HEMATOCRIT: 44.1 % (ref 39.0–52.0)
Hemoglobin: 15.1 g/dL (ref 13.0–17.0)
MCH: 31 pg (ref 26.0–34.0)
MCHC: 34.2 g/dL (ref 30.0–36.0)
MCV: 90.6 fL (ref 78.0–100.0)
PLATELETS: 418 10*3/uL — AB (ref 150–400)
RBC: 4.87 MIL/uL (ref 4.22–5.81)
RDW: 12.3 % (ref 11.5–15.5)
WBC: 20.5 10*3/uL — AB (ref 4.0–10.5)

## 2018-03-05 NOTE — Progress Notes (Signed)
[facility]/Tra[facility] Progress Note      Assessment/Plan Hx chronic back pain, T12 compression fracture  Acutesigmoid diverticulitis with perforation/abscess/free air - 1st episode of diverticulitis, no hx of colonoscopy - hopefully this will resolve without need for surgery   FEN: clears, IVF VTE: SCD's, lovenox ID: Zosyn 04/08>>   WBC up to 20.5 from 19.6 yesterday Foley: none  Follow up: TBD  DISPO: continue IVF, IV abx, advance to clears, CBC AM    LOS: 2 days    Subjective: CC: abdominal pain  Pt states he is feeling much better. Having flatus but no BM. No nausea or vomiting, fever or chills overnight. He states he still feels bloated and that there is more pressure in his abdomen than pain. Wife at bedside.Pt states he is trying to stretch out his pain medication so he is not taking as much. Pt had a total of 30mg  of oxycodone and 10mg  of dilaudid yesterday.   Objective: Vital signs in last 24 hours: Temp:  [98.1 F (36.7 C)-98.3 F (36.8 C)] 98.2 F (36.8 C) (04/10 0600) Pulse Rate:  [72-100] 91 (04/10 0600) Resp:  [16-18] 18 (04/10 0600) BP: (146-158)/(92-97) 146/92 (04/10 0600) SpO2:  [96 %-99 %] 96 % (04/10 0600) Last BM Date: 03/03/18  Intake/Output from previous day: 04/09 0701 - 04/10 0700 In: 1000 [I.V.:1000] Out: -  Intake/Output this shift: No intake/output data recorded.  PE: Gen:  Alert, NAD, pleasant, cooperative, obese Card:  RRR, no M/G/R heard Pulm:  CTA, no W/R/R, effort normal Abd: Soft, obese, not distended, +BS, TTP in lower abdomen, no guarding or signs of peritonitis Skin: no rashes noted, warm and dry  Anti-infectives: Anti-infectives (From admission, onward)   Start     Dose/Rate Route Frequency Ordered Stop   03/03/18 1800  piperacillin-tazobactam (ZOSYN) IVPB 3.375 g     3.375 g 12.5 mL/hr over 240 Minutes Intravenous Every 8 hours 03/03/18 1247     03/03/18 1015  piperacillin-tazobactam (ZOSYN) IVPB 3.375 g      3.375 g 100 mL/hr over 30 Minutes Intravenous  Once 03/03/18 1012 03/03/18 1050      Lab Results:  Recent Labs    03/04/18 0614 03/05/18 0811  WBC 19.6* 20.5*  HGB 15.4 15.1  HCT 47.2 44.1  PLT 445* 418*   BMET Recent Labs    03/03/18 0840 03/04/18 0614  NA 137 136  K 4.1 4.4  CL 103 100*  CO2 22 24  GLUCOSE 152* 115*  BUN 20 12  CREATININE 1.16 0.91  CALCIUM 9.0 8.6*   PT/INR No results for input(s): LABPROT, INR in the last 72 hours. CMP     Component Value Date/Time   NA 136 03/04/2018 0614   K 4.4 03/04/2018 0614   CL 100 (L) 03/04/2018 0614   CO2 24 03/04/2018 0614   GLUCOSE 115 (H) 03/04/2018 0614   BUN 12 03/04/2018 0614   CREATININE 0.91 03/04/2018 0614   CALCIUM 8.6 (L) 03/04/2018 0614   PROT 7.4 03/03/2018 0840   ALBUMIN 3.6 03/03/2018 0840   AST 33 03/03/2018 0840   ALT 57 03/03/2018 0840   ALKPHOS 82 03/03/2018 0840   BILITOT 0.2 (L) 03/03/2018 0840   GFRNONAA >60 03/04/2018 0614   GFRAA >60 03/04/2018 0614   Lipase     Component Value Date/Time   LIPASE 23 03/03/2018 0840    Studies/Results: Ct Abdomen Pelvis W Contrast  Result Date: 03/03/2018 CLINICAL DATA:  Abdominal pain for the past few days.  Diarrhea. EXAM: CT ABDOMEN AND PELVIS WITH CONTRAST TECHNIQUE: Multidetector CT imaging of the abdomen and pelvis was performed using the standard protocol following bolus administration of intravenous contrast. CONTRAST:  100mL ISOVUE-300 IOPAMIDOL (ISOVUE-300) INJECTION 61% COMPARISON:  None. FINDINGS: Lower chest: The lung bases are clear of acute process. No pleural effusion or pulmonary lesions. The heart is normal in size. No pericardial effusion. The distal esophagus and aorta are unremarkable. Hepatobiliary: No focal hepatic lesions or intrahepatic biliary dilatation. Diffuse fatty infiltration of the liver. The gallbladder appears normal. Focal fatty sparing around the gallbladder fossa. No common bile duct dilatation. Pancreas: No mass,  inflammation or ductal dilatation. Spleen: Normal size.  No focal lesions. Adrenals/Urinary Tract: The adrenal glands and kidneys are unremarkable. No ureteral or bladder calculi. No renal or bladder lesions. Stomach/Bowel: Acute complicated diverticulitis involving the sigmoid colon with a perforated diverticulum and developing abscess in the sigmoid mesocolon. There is also free air scattered throughout the abdomen. The stomach, duodenum, small bowel and remainder of the colon are unremarkable. There is age advanced descending and sigmoid colon diverticulosis. The terminal ileum and appendix are unremarkable. Vascular/Lymphatic: There are numerous small scattered retroperitoneal and sigmoid mesocolon lymph nodes which are likely inflammatory/hyperplastic. The major vascular structures are normal. Reproductive: The prostate gland and seminal vesicles are unremarkable. Other: Small amount of free fluid in the right pericolic gutter. Marked inflammation of the lower mesentery. Musculoskeletal: No significant bony findings. Stable T12 compression fracture and degenerative changes involving the hips. IMPRESSION: 1. 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 2. Diffuse fatty infiltration of the liver. 3. No mass or adenopathy. Electronically Signed   By: Rudie MeyerP.  Gallerani M.D.   On: 03/03/2018 10:18      Jerre SimonJessica L Cookie Pore , [facility] 03/05/2018, 9:21 AM  Pager: [phone] Mon-Wed, Friday 7:00am-4:30pm Thurs 7am-11:30am  Consults: [phone]

## 2018-03-06 LAB — CBC
HEMATOCRIT: 44.8 % (ref 39.0–52.0)
HEMOGLOBIN: 14.8 g/dL (ref 13.0–17.0)
MCH: 30.1 pg (ref 26.0–34.0)
MCHC: 33 g/dL (ref 30.0–36.0)
MCV: 91.2 fL (ref 78.0–100.0)
Platelets: 466 10*3/uL — ABNORMAL HIGH (ref 150–400)
RBC: 4.91 MIL/uL (ref 4.22–5.81)
RDW: 12.4 % (ref 11.5–15.5)
WBC: 18.8 10*3/uL — AB (ref 4.0–10.5)

## 2018-03-06 NOTE — Progress Notes (Signed)
[facility]/Trauma Progress Note      Assessment/Plan Hx chronic back pain, T12 compression fracture  Acutesigmoid diverticulitis with perforation/abscess/free air -1st episode of diverticulitis, no hx of colonoscopy - hopefully this will resolve without need for surgery  ZOX:WRUEAFEN:fulls, IVF @ 6850mL/hr VTE: SCD's, lovenox VW:UJWJX:Zosyn 04/08>>WBC down to 18.8 from 20.5 yesterday Foley:none Follow up:TBD  DISPO:continue IVF, IV abx, advance to fulls, CBC AM     LOS: 3 days    Subjective: CC: abdominal pain  Pain is greatly improved and he is feeling much better. Wife states he is back to himself. He had a large episode of loose BM this am. Tolerating clears. No fever, chills, nausea or vomiting overnight.   Objective: Vital signs in last 24 hours: Temp:  [98.5 F (36.9 C)-98.8 F (37.1 C)] 98.5 F (36.9 C) (04/11 0555) Pulse Rate:  [86-100] 100 (04/11 0555) Resp:  [17-20] 20 (04/11 0555) BP: (139-158)/(84-108) 153/102 (04/11 0555) SpO2:  [98 %-100 %] 98 % (04/11 0555) Last BM Date: 03/06/18  Intake/Output from previous day: 04/10 0701 - 04/11 0700 In: 3295.4 [P.O.:360; I.V.:2785.4; IV Piggyback:150] Out: -  Intake/Output this shift: Total I/O In: 464.6 [I.V.:464.6] Out: -   PE: Gen: Alert, NAD, pleasant, cooperative, obese Card: RRR, no M/G/R heard Pulm: CTA, no W/R/R, effort normal Abd: Soft,obese, not distended,+BS, mild TTP in LLQ, no guarding or signs of peritonitis Skin: no rashes noted, warm and dry  Anti-infectives: Anti-infectives (From admission, onward)   Start     Dose/Rate Route Frequency Ordered Stop   03/03/18 1800  piperacillin-tazobactam (ZOSYN) IVPB 3.375 g     3.375 g 12.5 mL/hr over 240 Minutes Intravenous Every 8 hours 03/03/18 1247     03/03/18 1015  piperacillin-tazobactam (ZOSYN) IVPB 3.375 g     3.375 g 100 mL/hr over 30 Minutes Intravenous  Once 03/03/18 1012 03/03/18 1050      Lab Results:  Recent  Labs    03/05/18 0811 03/06/18 0740  WBC 20.5* 18.8*  HGB 15.1 14.8  HCT 44.1 44.8  PLT 418* 466*   BMET Recent Labs    03/04/18 0614  NA 136  K 4.4  CL 100*  CO2 24  GLUCOSE 115*  BUN 12  CREATININE 0.91  CALCIUM 8.6*   PT/INR No results for input(s): LABPROT, INR in the last 72 hours. CMP     Component Value Date/Time   NA 136 03/04/2018 0614   K 4.4 03/04/2018 0614   CL 100 (L) 03/04/2018 0614   CO2 24 03/04/2018 0614   GLUCOSE 115 (H) 03/04/2018 0614   BUN 12 03/04/2018 0614   CREATININE 0.91 03/04/2018 0614   CALCIUM 8.6 (L) 03/04/2018 0614   PROT 7.4 03/03/2018 0840   ALBUMIN 3.6 03/03/2018 0840   AST 33 03/03/2018 0840   ALT 57 03/03/2018 0840   ALKPHOS 82 03/03/2018 0840   BILITOT 0.2 (L) 03/03/2018 0840   GFRNONAA >60 03/04/2018 0614   GFRAA >60 03/04/2018 0614   Lipase     Component Value Date/Time   LIPASE 23 03/03/2018 0840    Studies/Results: No results found.    Jerre SimonJessica L Jameka Ivie , [facility] 03/06/2018, 9:00 AM  Pager: [phone] Mon-Wed, Friday 7:00am-4:30pm Thurs 7am-11:30am  Consults: [phone]

## 2018-03-07 LAB — CBC
HEMATOCRIT: 43.8 % (ref 39.0–52.0)
Hemoglobin: 14.4 g/dL (ref 13.0–17.0)
MCH: 30 pg (ref 26.0–34.0)
MCHC: 32.9 g/dL (ref 30.0–36.0)
MCV: 91.3 fL (ref 78.0–100.0)
Platelets: 504 10*3/uL — ABNORMAL HIGH (ref 150–400)
RBC: 4.8 MIL/uL (ref 4.22–5.81)
RDW: 12.4 % (ref 11.5–15.5)
WBC: 16.7 10*3/uL — ABNORMAL HIGH (ref 4.0–10.5)

## 2018-03-07 MED ORDER — HYDROMORPHONE HCL 1 MG/ML IJ SOLN
0.5000 mg | INTRAMUSCULAR | Status: DC | PRN
  Administered 2018-03-09 – 2018-03-15 (×23): 0.5 mg via INTRAVENOUS
  Filled 2018-03-07 (×24): qty 0.5

## 2018-03-07 NOTE — Progress Notes (Signed)
[facility] Progress Note      Assessment/Plan Hx chronic back pain, T12 compression fracture  Acutesigmoid diverticulitis with perforation/abscess/free air -1st episode of diverticulitis, no hx of colonoscopy - hopefully this will resolve without need for surgery  WUJ:WJXBJFEN:fulls, IVF @ 6050mL/hr VTE: SCD's, lovenox YN:WGNFA:Zosyn 04/08>>WBC down to 16.7 from 18.8yesterday Foley:none Follow up:TBD  DISPO:continue IVF, IV abx,continue fulls, CBC AM    LOS: 4 days    Subjective: CC: abdominal pain  Pain worse yesterday with passing of gas and with bowel movements. Discussed increased use of dilaudid and need for pain medicine is indicative of increased pain. Discussed continuing full liquid diet until pain improves. Pt thinks oxycodone causes nausea. No vomiting. Had numerous bowel movements overnight. No fever or chills. Family at bedside.   Objective: Vital signs in last 24 hours: Temp:  [98 F (36.7 C)-99.9 F (37.7 C)] 98 F (36.7 C) (04/12 0510) Pulse Rate:  [86-92] 92 (04/12 0510) Resp:  [15-20] 20 (04/12 0510) BP: (134-157)/(79-93) 157/93 (04/12 0510) SpO2:  [95 %-97 %] 95 % (04/12 0510) Last BM Date: 03/06/18(Per pt report)  Intake/Output from previous day: 04/11 0701 - 04/12 0700 In: 2439.6 [P.O.:660; I.V.:1629.6; IV Piggyback:150] Out: -  Intake/Output this shift: Total I/O In: 120 [P.O.:120] Out: -   PE: Gen: Alert, NAD, pleasant, cooperative, obese Card: RRR, no M/G/R heard Pulm: CTA, no W/R/R, effort normal Abd: Soft,obese, not distended,+BS, TTP in lower abdomen, no guarding or signs of peritonitis Skin: no rashes noted, warm and dry   Anti-infectives: Anti-infectives (From admission, onward)   Start     Dose/Rate Route Frequency Ordered Stop   03/03/18 1800  piperacillin-tazobactam (ZOSYN) IVPB 3.375 g     3.375 g 12.5 mL/hr over 240 Minutes Intravenous Every 8 hours 03/03/18 1247     03/03/18 1015   piperacillin-tazobactam (ZOSYN) IVPB 3.375 g     3.375 g 100 mL/hr over 30 Minutes Intravenous  Once 03/03/18 1012 03/03/18 1050      Lab Results:  Recent Labs    03/06/18 0740 03/07/18 0546  WBC 18.8* 16.7*  HGB 14.8 14.4  HCT 44.8 43.8  PLT 466* 504*   BMET No results for input(s): NA, K, CL, CO2, GLUCOSE, BUN, CREATININE, CALCIUM in the last 72 hours. PT/INR No results for input(s): LABPROT, INR in the last 72 hours. CMP     Component Value Date/Time   NA 136 03/04/2018 0614   K 4.4 03/04/2018 0614   CL 100 (L) 03/04/2018 0614   CO2 24 03/04/2018 0614   GLUCOSE 115 (H) 03/04/2018 0614   BUN 12 03/04/2018 0614   CREATININE 0.91 03/04/2018 0614   CALCIUM 8.6 (L) 03/04/2018 0614   PROT 7.4 03/03/2018 0840   ALBUMIN 3.6 03/03/2018 0840   AST 33 03/03/2018 0840   ALT 57 03/03/2018 0840   ALKPHOS 82 03/03/2018 0840   BILITOT 0.2 (L) 03/03/2018 0840   GFRNONAA >60 03/04/2018 0614   GFRAA >60 03/04/2018 0614   Lipase     Component Value Date/Time   LIPASE 23 03/03/2018 0840    Studies/Results: No results found.    Jerre SimonJessica L Morgan Rennert , [facility] 03/07/2018, 10:18 AM  Pager: [phone] Mon-Wed, Friday 7:00am-4:30pm Thurs 7am-11:30am  Consults: [phone]

## 2018-03-08 LAB — CBC
HEMATOCRIT: 43.3 % (ref 39.0–52.0)
Hemoglobin: 14 g/dL (ref 13.0–17.0)
MCH: 29.8 pg (ref 26.0–34.0)
MCHC: 32.3 g/dL (ref 30.0–36.0)
MCV: 92.1 fL (ref 78.0–100.0)
Platelets: 444 10*3/uL — ABNORMAL HIGH (ref 150–400)
RBC: 4.7 MIL/uL (ref 4.22–5.81)
RDW: 12.5 % (ref 11.5–15.5)
WBC: 12.7 10*3/uL — ABNORMAL HIGH (ref 4.0–10.5)

## 2018-03-08 MED ORDER — LIP MEDEX EX OINT
1.0000 "application " | TOPICAL_OINTMENT | Freq: Two times a day (BID) | CUTANEOUS | Status: DC
  Administered 2018-03-08 – 2018-03-15 (×15): 1 via TOPICAL
  Filled 2018-03-08 (×3): qty 7

## 2018-03-08 MED ORDER — SODIUM CHLORIDE 0.9 % IV SOLN
250.0000 mL | INTRAVENOUS | Status: DC | PRN
Start: 2018-03-08 — End: 2018-03-15

## 2018-03-08 MED ORDER — MAGIC MOUTHWASH
15.0000 mL | Freq: Four times a day (QID) | ORAL | Status: DC | PRN
  Filled 2018-03-08: qty 15

## 2018-03-08 MED ORDER — HYDROCORTISONE 1 % EX CREA
1.0000 "application " | TOPICAL_CREAM | Freq: Three times a day (TID) | CUTANEOUS | Status: DC | PRN
  Filled 2018-03-08: qty 28

## 2018-03-08 MED ORDER — ALUM & MAG HYDROXIDE-SIMETH 200-200-20 MG/5ML PO SUSP
30.0000 mL | Freq: Four times a day (QID) | ORAL | Status: DC | PRN
  Administered 2018-03-09 – 2018-03-10 (×3): 30 mL via ORAL
  Filled 2018-03-08 (×3): qty 30

## 2018-03-08 MED ORDER — BISACODYL 10 MG RE SUPP: 10.0000 mg | Freq: Two times a day (BID) | RECTAL | Status: DC | PRN

## 2018-03-08 MED ORDER — SODIUM CHLORIDE 0.9% FLUSH: 3.0000 mL | INTRAVENOUS | Status: DC | PRN

## 2018-03-08 MED ORDER — PROCHLORPERAZINE EDISYLATE 5 MG/ML IJ SOLN: 5.0000 mg | INTRAMUSCULAR | Status: DC | PRN

## 2018-03-08 MED ORDER — SODIUM CHLORIDE 0.9% FLUSH
3.0000 mL | Freq: Two times a day (BID) | INTRAVENOUS | Status: DC
  Administered 2018-03-08 – 2018-03-14 (×12): 3 mL via INTRAVENOUS

## 2018-03-08 MED ORDER — HYDROCORTISONE 2.5 % RE CREA: 1.0000 "application " | TOPICAL_CREAM | Freq: Four times a day (QID) | RECTAL | Status: DC | PRN

## 2018-03-08 MED ORDER — HYDRALAZINE HCL 20 MG/ML IJ SOLN
5.0000 mg | INTRAMUSCULAR | Status: DC | PRN
  Administered 2018-03-11: 5 mg via INTRAVENOUS
  Filled 2018-03-08: qty 1

## 2018-03-08 MED ORDER — GUAIFENESIN-DM 100-10 MG/5ML PO SYRP: 10.0000 mL | ORAL_SOLUTION | ORAL | Status: DC | PRN

## 2018-03-08 MED ORDER — SACCHAROMYCES BOULARDII 250 MG PO CAPS
250.0000 mg | ORAL_CAPSULE | Freq: Two times a day (BID) | ORAL | Status: DC
  Administered 2018-03-08 – 2018-03-15 (×15): 250 mg via ORAL
  Filled 2018-03-08 (×15): qty 1

## 2018-03-08 MED ORDER — MENTHOL 3 MG MT LOZG
1.0000 | LOZENGE | OROMUCOSAL | Status: DC | PRN
  Filled 2018-03-08: qty 9

## 2018-03-08 MED ORDER — PHENOL 1.4 % MT LIQD: 1.0000 | OROMUCOSAL | Status: DC | PRN

## 2018-03-08 MED ORDER — LACTATED RINGERS IV BOLUS: 1000.0000 mL | Freq: Three times a day (TID) | INTRAVENOUS | Status: AC | PRN

## 2018-03-08 MED ORDER — LACTATED RINGERS IV BOLUS: 1000.0000 mL | Freq: Three times a day (TID) | INTRAVENOUS | Status: DC | PRN

## 2018-03-08 NOTE — Progress Notes (Signed)
[facility]  [address] [place] [address]  [place], [place] [address] Phone: [phone]  FAX: [phone]      Paul Gilmore [id number] 07-Oct-1976  CARE TEAM:  PCP: Patient, No Pcp Per  Outpatient Care Team: Patient Care Team: Patient, No Pcp Per as PCP - General (General Practice)  Inpatient Treatment Team: Treatment Team: Attending Provider: Montez Morita Md, MD; Consulting Physician: Montez Morita, Md, MD; Registered Nurse: Rondel Jumbo, RN; Registered Nurse: Benny Lennert, RN; Technician: Carloyn Jaeger, NT   Problem List:   Active Problems:   Perforation of sigmoid colon due to diverticulitis      * No surgery found *     Assessment Acutesigmoid diverticulitis with perforation/abscess/free air -1st episode of diverticulitis, no hx of colonoscopy - hopefully this will resolve without need for surgery  Improving  Plan:  Hx chronic back pain, T12 compression fracture XBM:WUXL diet - ADAT HH diet. Stop IVF w PRN backup VTE: SCD's, lovenox KG:MWNUU 04/08>>WBC trending down  Foley:none Follow up:TBD  DISPO:D/C patient from hospital on PO ABX when patient meets criteria (anticipate in 1-2 day(s)):  Tolerating oral intake well Ambulating well Adequate pain control without IV medications Urinating  Having flatus Disposition planning in place     20 minutes spent in review, evaluation, examination, counseling, and coordination of care.  More than 50% of that time was spent in counseling.  Ardeth Sportsman, M.D., F.A.C.S. Gastrointestinal and Minimally Invasive Surgery [facility] [address] [place], [place] [address] [phone] Main / Paging   03/08/2018    Subjective: (Chief complaint)  No pain tol fulls Wife in room  Objective:  Vital signs:  Vitals:   03/07/18 0510 03/07/18 1410 03/07/18 2111 03/08/18 0507  BP: (!) 157/93 100/79 (!) 142/104 131/89  Pulse: 92 92  88 71  Resp: 20 13  19   Temp: 98 F (36.7 C) 97.6 F (36.4 C) 99 F (37.2 C) 97.7 F (36.5 C)  TempSrc: Oral Oral Oral Oral  SpO2: 95% 90% 96% 96%  Weight:      Height:        Last BM Date: 03/07/18  Intake/Output   Yesterday:  04/12 0701 - 04/13 0700 In: 600 [P.O.:600] Out: -  This shift:  No intake/output data recorded.  Bowel function:  Flatus: YES  BM:  No  Drain: (No drain)   Physical Exam:  General: Pt awake/alert/oriented x4 in no acute distress Eyes: PERRL, normal EOM.  Sclera clear.  No icterus Neuro: CN II-XII intact w/o focal sensory/motor deficits. Lymph: No head/neck/groin lymphadenopathy Psych:  No delerium/psychosis/paranoia HENT: Normocephalic, Mucus membranes moist.  No thrush Neck: Supple, No tracheal deviation Chest: No chest wall pain w good excursion CV:  Pulses intact.  Regular rhythm MS: Normal AROM mjr joints.  No obvious deformity  Abdomen: Obese but Soft.  Nondistended.  Nontender.  No evidence of peritonitis.  No incarcerated hernias.  Ext:  No deformity.  No mjr edema.  No cyanosis Skin: No petechiae / purpura  Results:   Labs: Results for orders placed or performed during the hospital encounter of 03/03/18 (from the past 48 hour(s))  CBC     Status: Abnormal   Collection Time: 03/07/18  5:46 AM  Result Value Ref Range   WBC 16.7 (H) 4.0 - 10.5 K/uL   RBC 4.80 4.22 - 5.81 MIL/uL   Hemoglobin 14.4 13.0 - 17.0 g/dL   HCT 87.5 64.3 - 32.9 %  MCV 91.3 78.0 - 100.0 fL   MCH 30.0 26.0 - 34.0 pg   MCHC 32.9 30.0 - 36.0 g/dL   RDW 13.212.4 44.011.5 - 10.215.5 %   Platelets 504 (H) 150 - 400 K/uL    Comment: Performed at [facility], [address] [place], [place] [address]  CBC     Status: Abnormal   Collection Time: 03/08/18  5:29 AM  Result Value Ref Range   WBC 12.7 (H) 4.0 - 10.5 K/uL   RBC 4.70 4.22 - 5.81 MIL/uL   Hemoglobin 14.0 13.0 - 17.0 g/dL   HCT 64.443.3 03.439.0 - 74.252.0 %   MCV 92.1 78.0 - 100.0 fL   MCH 29.8  26.0 - 34.0 pg   MCHC 32.3 30.0 - 36.0 g/dL   RDW 59.512.5 63.811.5 - 75.615.5 %   Platelets 444 (H) 150 - 400 K/uL    Comment: Performed at [facility], [address] [place], [place] [address]    Imaging / Studies: No results found.  Medications / Allergies: per chart  Antibiotics: Anti-infectives (From admission, onward)   Start     Dose/Rate Route Frequency Ordered Stop   03/03/18 1800  piperacillin-tazobactam (ZOSYN) IVPB 3.375 g     3.375 g 12.5 mL/hr over 240 Minutes Intravenous Every 8 hours 03/03/18 1247     03/03/18 1015  piperacillin-tazobactam (ZOSYN) IVPB 3.375 g     3.375 g 100 mL/hr over 30 Minutes Intravenous  Once 03/03/18 1012 03/03/18 1050        Note: Portions of this report may have been transcribed using voice recognition software. Every effort was made to ensure accuracy; however, inadvertent computerized transcription errors may be present.   Any transcriptional errors that result from this process are unintentional.     Ardeth SportsmanSteven C. Mahealani Sulak, M.D., F.A.C.S. Gastrointestinal and Minimally Invasive Surgery [facility] [address] [place], [place] [address] [phone] Main / Paging   03/08/2018

## 2018-03-09 LAB — POTASSIUM: POTASSIUM: 3.9 mmol/L (ref 3.5–5.1)

## 2018-03-09 LAB — CBC
HCT: 43.3 % (ref 39.0–52.0)
HEMOGLOBIN: 14.1 g/dL (ref 13.0–17.0)
MCH: 30 pg (ref 26.0–34.0)
MCHC: 32.6 g/dL (ref 30.0–36.0)
MCV: 92.1 fL (ref 78.0–100.0)
PLATELETS: 490 10*3/uL — AB (ref 150–400)
RBC: 4.7 MIL/uL (ref 4.22–5.81)
RDW: 12.5 % (ref 11.5–15.5)
WBC: 14 10*3/uL — AB (ref 4.0–10.5)

## 2018-03-09 LAB — CREATININE, SERUM
CREATININE: 1.02 mg/dL (ref 0.61–1.24)
GFR calc non Af Amer: >60 mL/min (ref >60)

## 2018-03-09 LAB — MAGNESIUM: MAGNESIUM: 2.1 mg/dL (ref 1.7–2.4)

## 2018-03-09 NOTE — Progress Notes (Signed)
[place]  [address] [place], [place] [address] Phone: [phone]  FAX: [place] [phone] 20-Sep-1976  CARE TEAM:  PCP: Patient, No Pcp Per  Outpatient Care Team: Patient Care Team: Patient, No Pcp Per as PCP - General (General Practice)  Inpatient Treatment Team: Treatment Team: Attending Provider: Edison Pace, Md, MD; Consulting Physician: Edison Pace, Md, MD; Registered Nurse: Lolita Rieger, RN; Registered Nurse: Mliss Sax, RN; Technician: Vella Redhead, NT; Registered Nurse: Clarene Critchley, RN   Problem List:   Principal Problem:   Perforation of sigmoid colon due to diverticulitis Active Problems:   Obesity, Class III, BMI 40-49.9 (morbid obesity) ([place])   Hyperglycemia      * No surgery found *     Assessment Acutesigmoid diverticulitis with perforation/abscess/free air -1st episode of diverticulitis, no hx of colonoscopy - hopefully this will resolve without need for surgery  Improving slowly  Plan:   FEN:adv to [place].  Bowel regimen Stop IVF w PRN backup VTE: SCD's, lovenox IR:SWNIO 04/08>>WBC back up a little.  CT scan if worse tomorrow to r/o abscess Foley:none Hx chronic back pain, T12 compression fracture Follow up:  DISPO:D/C patient from hospital on PO ABX when patient meets criteria (anticipate in 1-2 day(s)):  Tolerating oral intake well Ambulating well Adequate pain control without IV medications Urinating  Having flatus WBC WNL Disposition planning in place     20 minutes spent in review, evaluation, examination, counseling, and coordination of care.  More than 50% of that time was spent in counseling.  Adin Hector, M.D., F.A.C.S. Gastrointestinal and Minimally Invasive Surgery [facility] [address], [place], [place] [address] [phone] Main / Paging   03/09/2018    Subjective: (Chief  complaint)  Some cramping w gas Better formed BM Tol solids  Objective:  Vital signs:  Vitals:   03/08/18 1500 03/08/18 2020 03/09/18 0512 03/09/18 0518  BP: 128/72 (!) 144/82 (!) 147/129 134/84  Pulse: 84 81 80   Resp: '20 19 18   '$ Temp: 97.6 F (36.4 C) 98.1 F (36.7 C) 97.6 F (36.4 C)   TempSrc: Oral Oral Oral   SpO2: 97% 96% 96%   Weight:      Height:        Last BM Date: 03/08/18  Intake/Output   Yesterday:  04/13 0701 - 04/14 0700 In: 700 [P.O.:600; IV Piggyback:100] Out: 6 [Urine:6] This shift:  No intake/output data recorded.  Bowel function:  Flatus: YES  BM:  YES  Drain: (No drain)   Physical Exam:  General: Pt awake/alert/oriented x4 in no acute distress Eyes: PERRL, normal EOM.  Sclera clear.  No icterus Neuro: CN II-XII intact w/o focal sensory/motor deficits. Lymph: No head/neck/groin lymphadenopathy Psych:  No delerium/psychosis/paranoia HENT: Normocephalic, Mucus membranes moist.  No thrush Neck: Supple, No tracheal deviation Chest: No chest wall pain w good excursion CV:  Pulses intact.  Regular rhythm MS: Normal AROM mjr joints.  No obvious deformity  Abdomen: Obese but Soft.  Nondistended.  Tenderness at LLQ mild.  No evidence of peritonitis.  No incarcerated hernias.  Ext:  No deformity.  No mjr edema.  No cyanosis Skin: No petechiae / purpura  Results:   Labs: Results for orders placed or performed during the hospital encounter of 03/03/18 (from the past 48 hour(s))  CBC     Status: Abnormal   Collection Time: 03/08/18  5:29 AM  Result Value Ref Range   WBC 12.7 (H) 4.0 - 10.5 K/uL   RBC 4.70 4.22 - 5.81 MIL/uL   Hemoglobin 14.0 13.0 - 17.0 g/dL   HCT 43.3 39.0 - 52.0 %   MCV 92.1 78.0 - 100.0 fL   MCH 29.8 26.0 - 34.0 pg   MCHC 32.3 30.0 - 36.0 g/dL   RDW 12.5 11.5 - 15.5 %   Platelets 444 (H) 150 - 400 K/uL    Comment: Performed at [facility], [place] [address] [place], [place] [address]  CBC      Status: Abnormal   Collection Time: 03/09/18  5:18 AM  Result Value Ref Range   WBC 14.0 (H) 4.0 - 10.5 K/uL   RBC 4.70 4.22 - 5.81 MIL/uL   Hemoglobin 14.1 13.0 - 17.0 g/dL   HCT 43.3 39.0 - 52.0 %   MCV 92.1 78.0 - 100.0 fL   MCH 30.0 26.0 - 34.0 pg   MCHC 32.6 30.0 - 36.0 g/dL   RDW 12.5 11.5 - 15.5 %   Platelets 490 (H) 150 - 400 K/uL    Comment: Performed at [facility], [place] [address], [place], [place] [address]  Creatinine, serum     Status: None   Collection Time: 03/09/18  5:18 AM  Result Value Ref Range   Creatinine, Ser 1.02 0.61 - 1.24 mg/dL   GFR calc non Af Amer >60 >60 mL/min   GFR calc Af Amer >60 >60 mL/min    Comment: (NOTE) The eGFR has been calculated using the CKD EPI equation. This calculation has not been validated in all clinical situations. eGFR's persistently <60 mL/min signify possible Chronic Kidney Disease. Performed at [facility], [place] [address], [place], [place] [address]   Potassium     Status: None   Collection Time: 03/09/18  5:18 AM  Result Value Ref Range   Potassium 3.9 3.5 - 5.1 mmol/L    Comment: Performed at [facility], [place] [address], [place], [place] [address]  Magnesium     Status: None   Collection Time: 03/09/18  5:18 AM  Result Value Ref Range   Magnesium 2.1 1.7 - 2.4 mg/dL    Comment: Performed at [facility], [place] [address] [place], [place] [address]    Imaging / Studies: No results found.  Medications / Allergies: per chart  Antibiotics: Anti-infectives (From admission, onward)   Start     Dose/Rate Route Frequency Ordered Stop   03/03/18 1800  piperacillin-tazobactam (ZOSYN) IVPB 3.375 g     3.375 g 12.5 mL/hr over 240 Minutes Intravenous Every 8 hours 03/03/18 1247     03/03/18 1015  piperacillin-tazobactam (ZOSYN) IVPB 3.375 g     3.375 g 100 mL/hr over 30 Minutes Intravenous  Once 03/03/18 1012 03/03/18 1050         Note: Portions of this report may have been transcribed using voice recognition software. Every effort was made to ensure accuracy; however, inadvertent computerized transcription errors may be present.   Any transcriptional errors that result from this process are unintentional.     Adin Hector, M.D., F.A.C.S. Gastrointestinal and Minimally Invasive Surgery [facility] [address], [place], [place] [address] [phone] Main / Paging   03/09/2018

## 2018-03-10 LAB — CBC
HEMATOCRIT: 44.4 % (ref 39.0–52.0)
Hemoglobin: 14.4 g/dL (ref 13.0–17.0)
MCH: 30.1 pg (ref 26.0–34.0)
MCHC: 32.4 g/dL (ref 30.0–36.0)
MCV: 92.7 fL (ref 78.0–100.0)
PLATELETS: 550 10*3/uL — AB (ref 150–400)
RBC: 4.79 MIL/uL (ref 4.22–5.81)
RDW: 12.7 % (ref 11.5–15.5)
WBC: 13.5 10*3/uL — AB (ref 4.0–10.5)

## 2018-03-10 NOTE — Progress Notes (Signed)
[facility] Progress Note      Assessment/Plan Hx chronic back pain, T12 compression fracture  Acutesigmoid diverticulitis with perforation/abscess/free air -1st episode of diverticulitis, no hx of colonoscopy - hopefully this will resolve without need for surgery  UUV:OZDGFEN:soft diet VTE: SCD's, lovenox UY:QIHKV:Zosyn 04/08>>WBC down to 13.5 from 14.0yesterday Foley:none Follow up:TBD  DISPO:possible home today vs CT scan. Will discuss with MD    LOS: 7 days    Subjective: CC: abdominal pain  Pt states he is feeling much better. He was up walking this morning. Pt states no pain at rest. Mild pain with bowel movements. Feels bloated still. No nausea or vomiting. Tolerating diet. No new complaints. No fever, chills, pain or swelling in calves b/l.   Objective: Vital signs in last 24 hours: Temp:  [97.7 F (36.5 C)-98.5 F (36.9 C)] 98.5 F (36.9 C) (04/15 0421) Pulse Rate:  [69-82] 77 (04/15 0421) Resp:  [16-19] 19 (04/15 0421) BP: (132-147)/(72-81) 137/80 (04/15 0421) SpO2:  [96 %-98 %] 97 % (04/15 0421) Last BM Date: 03/09/18  Intake/Output from previous day: 04/14 0701 - 04/15 0700 In: 820 [P.O.:720; IV Piggyback:100] Out: 3 [Urine:3] Intake/Output this shift: Total I/O In: 243 [P.O.:240; I.V.:3] Out: -   PE: Gen: Alert, NAD, pleasant, cooperative, obese Card: RRR, no M/G/R heard Pulm: CTA, no W/R/R, effort normal Abd: Soft,obese, not distended,+BS,no TTP, no guarding or signs of peritonitis Skin: no rashes noted, warm and dry   Anti-infectives: Anti-infectives (From admission, onward)   Start     Dose/Rate Route Frequency Ordered Stop   03/03/18 1800  piperacillin-tazobactam (ZOSYN) IVPB 3.375 g     3.375 g 12.5 mL/hr over 240 Minutes Intravenous Every 8 hours 03/03/18 1247     03/03/18 1015  piperacillin-tazobactam (ZOSYN) IVPB 3.375 g     3.375 g 100 mL/hr over 30 Minutes Intravenous  Once 03/03/18 1012 03/03/18 1050       Lab Results:  Recent Labs    03/09/18 0518 03/10/18 0738  WBC 14.0* 13.5*  HGB 14.1 14.4  HCT 43.3 44.4  PLT 490* 550*   BMET Recent Labs    03/09/18 0518  K 3.9  CREATININE 1.02   PT/INR No results for input(s): LABPROT, INR in the last 72 hours. CMP     Component Value Date/Time   NA 136 03/04/2018 0614   K 3.9 03/09/2018 0518   CL 100 (L) 03/04/2018 0614   CO2 24 03/04/2018 0614   GLUCOSE 115 (H) 03/04/2018 0614   BUN 12 03/04/2018 0614   CREATININE 1.02 03/09/2018 0518   CALCIUM 8.6 (L) 03/04/2018 0614   PROT 7.4 03/03/2018 0840   ALBUMIN 3.6 03/03/2018 0840   AST 33 03/03/2018 0840   ALT 57 03/03/2018 0840   ALKPHOS 82 03/03/2018 0840   BILITOT 0.2 (L) 03/03/2018 0840   GFRNONAA >60 03/09/2018 0518   GFRAA >60 03/09/2018 0518   Lipase     Component Value Date/Time   LIPASE 23 03/03/2018 0840    Studies/Results: No results found.    Jerre SimonJessica L Aunika Kirsten , [facility] 03/10/2018, 9:35 AM  Pager: [phone] Mon-Wed, Friday 7:00am-4:30pm Thurs 7am-11:30am  Consults: [phone]

## 2018-03-11 ENCOUNTER — Encounter (HOSPITAL_COMMUNITY): Payer: Self-pay | Admitting: Radiology

## 2018-03-11 ENCOUNTER — Inpatient Hospital Stay (HOSPITAL_COMMUNITY): Payer: BLUE CROSS/BLUE SHIELD

## 2018-03-11 LAB — CBC
HCT: 44.2 % (ref 39.0–52.0)
Hemoglobin: 14.7 g/dL (ref 13.0–17.0)
MCH: 30.2 pg (ref 26.0–34.0)
MCHC: 33.3 g/dL (ref 30.0–36.0)
MCV: 90.9 fL (ref 78.0–100.0)
PLATELETS: 560 10*3/uL — AB (ref 150–400)
RBC: 4.86 MIL/uL (ref 4.22–5.81)
RDW: 12.5 % (ref 11.5–15.5)
WBC: 15.7 10*3/uL — AB (ref 4.0–10.5)

## 2018-03-11 MED ORDER — IOPAMIDOL (ISOVUE-300) INJECTION 61%
INTRAVENOUS | Status: AC
  Administered 2018-03-11: 08:00:00
  Filled 2018-03-11: qty 30

## 2018-03-11 MED ORDER — IOHEXOL 300 MG/ML  SOLN
100.0000 mL | Freq: Once | INTRAMUSCULAR | Status: AC | PRN
  Administered 2018-03-11: 100 mL via INTRAVENOUS

## 2018-03-11 MED ORDER — IOPAMIDOL (ISOVUE-300) INJECTION 61%: 15.0000 mL | Freq: Once | INTRAVENOUS | Status: AC | PRN

## 2018-03-11 NOTE — Progress Notes (Addendum)
[facility] Progress Note      Assessment/Plan Hx chronic back pain, T12 compression fracture  Acutesigmoid diverticulitis with perforation/abscess/free air -1st episode of diverticulitis, no hx of colonoscopy - hopefully this will resolve without need for surgery - CT scan pending in setting of increased WBC  ZOX:WRUEFEN:soft diet VTE: SCD's, lovenox AV:WUJWJ:Zosyn 04/08>>WBC up to 15.7 from 13.5 yesterday, afebrile Foley:none Follow up:TBD  DISPO:CT scan pending.   Addendum: 1113: CT scan showed new abscess. Will consult IR for possible drainage.    LOS: 8 days    Subjective: CC: abdominal pain  Pt states he is feeling good without pain. No nausea or vomiting. Tolerating diet. No new complaints. No fever or chills. Wife at bedside. Pt anxious to go home.    Objective: Vital signs in last 24 hours: Temp:  [97.4 F (36.3 C)-98.4 F (36.9 C)] 97.4 F (36.3 C) (04/16 0617) Pulse Rate:  [72-81] 77 (04/16 0617) Resp:  [17-20] 18 (04/16 0617) BP: (137-148)/(77-94) 144/89 (04/16 0617) SpO2:  [95 %-98 %] 98 % (04/16 0617) Last BM Date: 03/09/18  Intake/Output from previous day: 04/15 0701 - 04/16 0700 In: 773 [P.O.:720; I.V.:3; IV Piggyback:50] Out: -  Intake/Output this shift: Total I/O In: 240 [P.O.:240] Out: -   PE: Gen: Alert, NAD, pleasant, cooperative, obese Card: RRR, no M/G/R heard Pulm: CTA, no W/R/R, effort normal Abd: Soft,obese, not distended,+BS,no TTP, no guarding or signs of peritonitis Skin: no rashes noted, warm and dry   Anti-infectives: Anti-infectives (From admission, onward)   Start     Dose/Rate Route Frequency Ordered Stop   03/03/18 1800  piperacillin-tazobactam (ZOSYN) IVPB 3.375 g     3.375 g 12.5 mL/hr over 240 Minutes Intravenous Every 8 hours 03/03/18 1247     03/03/18 1015  piperacillin-tazobactam (ZOSYN) IVPB 3.375 g     3.375 g 100 mL/hr over 30 Minutes Intravenous  Once 03/03/18 1012 03/03/18  1050      Lab Results:  Recent Labs    03/10/18 0738 03/11/18 0720  WBC 13.5* 15.7*  HGB 14.4 14.7  HCT 44.4 44.2  PLT 550* 560*   BMET Recent Labs    03/09/18 0518  K 3.9  CREATININE 1.02   PT/INR No results for input(s): LABPROT, INR in the last 72 hours. CMP     Component Value Date/Time   NA 136 03/04/2018 0614   K 3.9 03/09/2018 0518   CL 100 (L) 03/04/2018 0614   CO2 24 03/04/2018 0614   GLUCOSE 115 (H) 03/04/2018 0614   BUN 12 03/04/2018 0614   CREATININE 1.02 03/09/2018 0518   CALCIUM 8.6 (L) 03/04/2018 0614   PROT 7.4 03/03/2018 0840   ALBUMIN 3.6 03/03/2018 0840   AST 33 03/03/2018 0840   ALT 57 03/03/2018 0840   ALKPHOS 82 03/03/2018 0840   BILITOT 0.2 (L) 03/03/2018 0840   GFRNONAA >60 03/09/2018 0518   GFRAA >60 03/09/2018 0518   Lipase     Component Value Date/Time   LIPASE 23 03/03/2018 0840    Studies/Results: No results found.    Jerre SimonJessica L Wynter Grave , [facility] 03/11/2018, 9:40 AM  Pager: [phone] Mon-Wed, Friday 7:00am-4:30pm Thurs 7am-11:30am  Consults: [phone]

## 2018-03-11 NOTE — Progress Notes (Addendum)
Patient ID: Delila Spenceeter A Wainright, male   DOB: 11/17/1976, 42 y.o.   MRN: [id number] Request received for possible CT-guided drainage of diverticular abscess in patient.  Imaging studies were reviewed by Dr.Henn and there is no safe percutaneous window to access abscess at this time.

## 2018-03-12 LAB — CBC
HCT: 45.4 % (ref 39.0–52.0)
Hemoglobin: 15.4 g/dL (ref 13.0–17.0)
MCH: 30.6 pg (ref 26.0–34.0)
MCHC: 33.9 g/dL (ref 30.0–36.0)
MCV: 90.3 fL (ref 78.0–100.0)
PLATELETS: 563 10*3/uL — AB (ref 150–400)
RBC: 5.03 MIL/uL (ref 4.22–5.81)
RDW: 12.5 % (ref 11.5–15.5)
WBC: 13.1 10*3/uL — ABNORMAL HIGH (ref 4.0–10.5)

## 2018-03-12 NOTE — Care Management Note (Signed)
Case Management Note  Patient Details  Name: Paul Gilmore MRN: [id number] Date of Birth: 09/08/1976  Subjective/Objective:    42 yo admitted with perforation of sigmoid colon due to diverticulitis,               Action/Plan: Pt was home with spouse and independent with ADLs prior to admission. No home needs noted at this time. CM will continue to follow.  Expected Discharge Date:  (unknown)               Expected Discharge Plan:  Home/Self Care  In-House Referral:     Discharge planning Services  CM Consult  Post Acute Care Choice:    Choice offered to:     DME Arranged:    DME Agency:     HH Arranged:    HH Agency:     Status of Service:  In process, will continue to follow  If discussed at Long Length of Stay Meetings, dates discussed:    Additional CommentsBartholome Bill:  Paul Gilmore H, RN 03/12/2018, 10:26 AM [phone]

## 2018-03-12 NOTE — Progress Notes (Signed)
[facility]/Trauma Progress Note      Assessment/Plan Hx chronic back pain, T12 compression fracture  Acutesigmoid diverticulitis with perforation/abscess/free air -1st episode of diverticulitis, no hx of colonoscopy - hopefully this will resolve without need for surgery - CT showed new abscess, WBC still elevated  ZOX:WRUE diet VTE: SCD's, lovenox AV:WUJWJ 04/08>>WBC up to 13.1 from 15.7 yesterday, afebrile Foley:none Follow up:TBD  DISPO:IR unable to drain abscess. Will continue medical management in hopes that WBC and pt improves without need for emergent surgery. We may need to proceed with colectomy if he fails to improve or there is any worsening    LOS: 9 days    Subjective: CC: lower abdominal discomfort with bowel movements. Pain overall greatly improved. Tolerating diet. No issues overnight. No fever or chills. No family at bedside.   Objective: Vital signs in last 24 hours: Temp:  [97.8 F (36.6 C)-98.1 F (36.7 C)] 97.8 F (36.6 C) (04/17 0542) Pulse Rate:  [68-89] 79 (04/17 0542) Resp:  [16-20] 20 (04/17 0542) BP: (130-163)/(88-109) 163/95 (04/17 0542) SpO2:  [94 %-98 %] 98 % (04/17 0542) Last BM Date: 03/11/18  Intake/Output from previous day: 04/16 0701 - 04/17 0700 In: 530 [P.O.:480; IV Piggyback:50] Out: -  Intake/Output this shift: No intake/output data recorded.  PE: Gen: Alert, NAD, pleasant, cooperative, obese Card: RRR, no M/G/R heard Pulm: CTA, no W/R/R, effort normal Abd: Soft,obese, not distended,+BS,very mild TTP in LLQ, no guarding or signs of peritonitis Skin: no rashes noted, warm and dry   Anti-infectives: Anti-infectives (From admission, onward)   Start     Dose/Rate Route Frequency Ordered Stop   03/03/18 1800  piperacillin-tazobactam (ZOSYN) IVPB 3.375 g     3.375 g 12.5 mL/hr over 240 Minutes Intravenous Every 8 hours 03/03/18 1247     03/03/18 1015  piperacillin-tazobactam (ZOSYN) IVPB  3.375 g     3.375 g 100 mL/hr over 30 Minutes Intravenous  Once 03/03/18 1012 03/03/18 1050      Lab Results:  Recent Labs    03/11/18 0720 03/12/18 0538  WBC 15.7* 13.1*  HGB 14.7 15.4  HCT 44.2 45.4  PLT 560* 563*   BMET No results for input(s): NA, K, CL, CO2, GLUCOSE, BUN, CREATININE, CALCIUM in the last 72 hours. PT/INR No results for input(s): LABPROT, INR in the last 72 hours. CMP     Component Value Date/Time   NA 136 03/04/2018 0614   K 3.9 03/09/2018 0518   CL 100 (L) 03/04/2018 0614   CO2 24 03/04/2018 0614   GLUCOSE 115 (H) 03/04/2018 0614   BUN 12 03/04/2018 0614   CREATININE 1.02 03/09/2018 0518   CALCIUM 8.6 (L) 03/04/2018 0614   PROT 7.4 03/03/2018 0840   ALBUMIN 3.6 03/03/2018 0840   AST 33 03/03/2018 0840   ALT 57 03/03/2018 0840   ALKPHOS 82 03/03/2018 0840   BILITOT 0.2 (L) 03/03/2018 0840   GFRNONAA >60 03/09/2018 0518   GFRAA >60 03/09/2018 0518   Lipase     Component Value Date/Time   LIPASE 23 03/03/2018 0840    Studies/Results: Ct Abdomen Pelvis W Contrast  Result Date: 03/11/2018 CLINICAL DATA:  42 year old male. Follow-up diverticulitis. Subsequent encounter. EXAM: CT ABDOMEN AND PELVIS WITH CONTRAST TECHNIQUE: Multidetector CT imaging of the abdomen and pelvis was performed using the standard protocol following bolus administration of intravenous contrast. CONTRAST:  OMNIPAQUE IOHEXOL 300 MG/ML  SOLN COMPARISON:  03/03/2018 CT. FINDINGS: Lower chest: Minimal scarring/atelectasis lung bases. Heart size within normal  limits. Aortic valve calcifications. Hepatobiliary: Enlarged fatty liver spanning over 21.8 cm. 1.2 cm rounded area of enhancement posterior dome right lobe liver may represent transient hepatic attenuation difference or possibly small hemangioma. Focal fatty sparing adjacent to gallbladder fossa. Gallbladder is full without calcified gallstone. Pancreas: No worrisome hepatic lesion or inflammation. Spleen: No splenic  lesion or enlargement. Adrenals/Urinary Tract: No obstructing stone or hydronephrosis. No worrisome renal or adrenal lesion. Noncontrast filled partially contracted urinary bladder without gross abnormality. Stomach/Bowel: Sigmoid diverticulitis with perforation of superiorly located diverticula. Abscess collection within mesentery superior to the sigmoid colon with fistula communication to perforated diverticula (series 4, image 69). Abscess collection within the mesentery has changed size and configuration now with greater superior extension. The component immediately superior to the sigmoid colon spans over 5.6 x 4 x 5.9 cm versus prior 5.8 x 5.5 x 7.1 cm however, there is a new component of abscess superior to this level containing fluid and air spanning over 6.4 x 4.4 x 3.9 cm. Abscess and surrounding inflammation/fluid surrounds small-bowel and causes slight narrowing of adjacent small bowel loops. Inflammatory process extends along the iliac arteries. Adjacent adenopathy. After acute episode has cleared, this segment of sigmoid colon should be assessed to exclude the possibility of underlying malignancy given the degree of surrounding adenopathy. Diverticular throughout portions of the descending colon remainder of sigmoid colon. Appendix is located medial to the inflammatory process and does not appear inflamed. No primary gastric abnormality noted. Vascular/Lymphatic: No abdominal aortic aneurysm or large vessel occlusion. Inflammatory process extends along the iliac arteries. Increase number of normal size is slightly prominent size lymph nodes surrounds the sigmoid diverticulitis and abscess. Increase number of normal size retroperitoneal lymph nodes. Reproductive: Prostate gland calcifications incidentally noted. Prostate gland does not appear enlarged. Symmetric normal-appearing seminal vesicles. Other: As above. Musculoskeletal: Stable T12 compression fracture involving superior endplate with anterior  wedging and 70% loss of height centrally. Minimal retropulsion posterosuperior aspect. Mild degenerative changes L5-S1. Mild degenerative changes hip joints. IMPRESSION: Sigmoid diverticulitis with perforation and abscess within mesentery superior to the sigmoid colon. Abscess has change configuration and size with progressive superior extension as detailed above. Abscess and surrounding free fluid causes irritation of adjacent small bowel which is slightly contracted. Enlarged fatty liver spanning over 21.8 cm. 1.2 cm rounded area of enhancement posterior dome right lobe liver may represent transient hepatic attenuation difference or possibly small hemangioma. Stable appearing T12 anterior wedge compression deformity. These results will be called to the ordering clinician or representative by the Radiologist Assistant, and communication documented in the PACS or zVision Dashboard. Electronically Signed   By: Lacy DuverneySteven  Olson M.D.   On: 03/11/2018 10:53      Jerre SimonJessica L Winfred Redel , [facility] 03/12/2018, 9:52 AM  Pager: [phone] Mon-Wed, Friday 7:00am-4:30pm Thurs 7am-11:30am  Consults: [phone]

## 2018-03-13 LAB — COMPREHENSIVE METABOLIC PANEL
ALK PHOS: 71 U/L (ref 38–126)
ALT: 28 U/L (ref 17–63)
ANION GAP: 11 (ref 5–15)
AST: 22 U/L (ref 15–41)
Albumin: 3 g/dL — ABNORMAL LOW (ref 3.5–5.0)
BILIRUBIN TOTAL: 0.5 mg/dL (ref 0.3–1.2)
BUN: 20 mg/dL (ref 6–20)
CALCIUM: 8.8 mg/dL — AB (ref 8.9–10.3)
CO2: 21 mmol/L — AB (ref 22–32)
Chloride: 105 mmol/L (ref 101–111)
Creatinine, Ser: 0.95 mg/dL (ref 0.61–1.24)
GFR calc non Af Amer: >60 mL/min (ref >60)
Glucose, Bld: 113 mg/dL — ABNORMAL HIGH (ref 65–99)
POTASSIUM: 4.8 mmol/L (ref 3.5–5.1)
SODIUM: 137 mmol/L (ref 135–145)
TOTAL PROTEIN: 7.3 g/dL (ref 6.5–8.1)

## 2018-03-13 LAB — CBC
HCT: 44.6 % (ref 39.0–52.0)
HEMOGLOBIN: 14.9 g/dL (ref 13.0–17.0)
MCH: 30.6 pg (ref 26.0–34.0)
MCHC: 33.4 g/dL (ref 30.0–36.0)
MCV: 91.6 fL (ref 78.0–100.0)
Platelets: 519 10*3/uL — ABNORMAL HIGH (ref 150–400)
RBC: 4.87 MIL/uL (ref 4.22–5.81)
RDW: 12.6 % (ref 11.5–15.5)
WBC: 13.3 10*3/uL — ABNORMAL HIGH (ref 4.0–10.5)

## 2018-03-13 NOTE — Progress Notes (Signed)
[facility]/Tra[facility] Progress Note      Assessment/Plan Hx chronic back pain, T12 compression fracture  Acutesigmoid diverticulitis with perforation/abscess/free air -1st episode of diverticulitis, no hx of colonoscopy - hopefully this will resolve without need for surgery - CT showed new abscess, WBC still elevated  ZOX:WRUEFEN:soft diet VTE: SCD's, lovenox AV:WUJWJ:Zosyn 04/08>>WBC up to 13.3 from 13.1yesterday, afebrile Foley:none Follow up:TBD  DISPO:Will continue medical management in hopes that WBC and pt improves without need for emergent surgery. We may need to proceed with colectomy if he fails to improve or there is any worsening    LOS: 10 days    Subjective: CC: lower abdominal discomfort with bowel movements improving but still present  Tolerating diet. No issues overnight. No fever or chills. No family at bedside. Pt has not been walking halls but up in his room. I encouraged ambulation in the halls.    Objective: Vital signs in last 24 hours: Temp:  [97.4 F (36.3 C)-98.6 F (37 C)] 97.5 F (36.4 C) (04/18 0538) Pulse Rate:  [79-92] 92 (04/18 0538) Resp:  [17-21] 17 (04/18 0538) BP: (136-156)/(87-102) 156/102 (04/18 0538) SpO2:  [97 %-99 %] 99 % (04/18 0538) Last BM Date: 03/12/18  Intake/Output from previous day: 04/17 0701 - 04/18 0700 In: 480 [P.O.:480] Out: -  Intake/Output this shift: No intake/output data recorded.  PE: Gen: Alert, NAD, pleasant, cooperative, obese Card: RRR, no M/G/R heard Pulm: CTA, no W/R/R, effort normal Abd: Soft,obese, not distended,+BS,no TTP, no guarding or signs of peritonitis Extremities: no swelling or tenderness in b/l calves Skin: no rashes noted, warm and dry  Anti-infectives: Anti-infectives (From admission, onward)   Start     Dose/Rate Route Frequency Ordered Stop   03/03/18 1800  piperacillin-tazobactam (ZOSYN) IVPB 3.375 g     3.375 g 12.5 mL/hr over 240 Minutes Intravenous Every  8 hours 03/03/18 1247     03/03/18 1015  piperacillin-tazobactam (ZOSYN) IVPB 3.375 g     3.375 g 100 mL/hr over 30 Minutes Intravenous  Once 03/03/18 1012 03/03/18 1050      Lab Results:  Recent Labs    03/12/18 0538 03/13/18 0549  WBC 13.1* 13.3*  HGB 15.4 14.9  HCT 45.4 44.6  PLT 563* 519*   BMET Recent Labs    03/13/18 0549  NA 137  K 4.8  CL 105  CO2 21*  GLUCOSE 113*  BUN 20  CREATININE 0.95  CALCIUM 8.8*   PT/INR No results for input(s): LABPROT, INR in the last 72 hours. CMP     Component Value Date/Time   NA 137 03/13/2018 0549   K 4.8 03/13/2018 0549   CL 105 03/13/2018 0549   CO2 21 (L) 03/13/2018 0549   GLUCOSE 113 (H) 03/13/2018 0549   BUN 20 03/13/2018 0549   CREATININE 0.95 03/13/2018 0549   CALCIUM 8.8 (L) 03/13/2018 0549   PROT 7.3 03/13/2018 0549   ALBUMIN 3.0 (L) 03/13/2018 0549   AST 22 03/13/2018 0549   ALT 28 03/13/2018 0549   ALKPHOS 71 03/13/2018 0549   BILITOT 0.5 03/13/2018 0549   GFRNONAA >60 03/13/2018 0549   GFRAA >60 03/13/2018 0549   Lipase     Component Value Date/Time   LIPASE 23 03/03/2018 0840    Studies/Results: Ct Abdomen Pelvis W Contrast  Result Date: 03/11/2018 CLINICAL DATA:  42 year old male. Follow-up diverticulitis. Subsequent encounter. EXAM: CT ABDOMEN AND PELVIS WITH CONTRAST TECHNIQUE: Multidetector CT imaging of the abdomen and pelvis was performed using the standard  protocol following bolus administration of intravenous contrast. CONTRAST:  OMNIPAQUE IOHEXOL 300 MG/ML  SOLN COMPARISON:  03/03/2018 CT. FINDINGS: Lower chest: Minimal scarring/atelectasis lung bases. Heart size within normal limits. Aortic valve calcifications. Hepatobiliary: Enlarged fatty liver spanning over 21.8 cm. 1.2 cm rounded area of enhancement posterior dome right lobe liver may represent transient hepatic attenuation difference or possibly small hemangioma. Focal fatty sparing adjacent to gallbladder fossa. Gallbladder is full  without calcified gallstone. Pancreas: No worrisome hepatic lesion or inflammation. Spleen: No splenic lesion or enlargement. Adrenals/Urinary Tract: No obstructing stone or hydronephrosis. No worrisome renal or adrenal lesion. Noncontrast filled partially contracted urinary bladder without gross abnormality. Stomach/Bowel: Sigmoid diverticulitis with perforation of superiorly located diverticula. Abscess collection within mesentery superior to the sigmoid colon with fistula communication to perforated diverticula (series 4, image 69). Abscess collection within the mesentery has changed size and configuration now with greater superior extension. The component immediately superior to the sigmoid colon spans over 5.6 x 4 x 5.9 cm versus prior 5.8 x 5.5 x 7.1 cm however, there is a new component of abscess superior to this level containing fluid and air spanning over 6.4 x 4.4 x 3.9 cm. Abscess and surrounding inflammation/fluid surrounds small-bowel and causes slight narrowing of adjacent small bowel loops. Inflammatory process extends along the iliac arteries. Adjacent adenopathy. After acute episode has cleared, this segment of sigmoid colon should be assessed to exclude the possibility of underlying malignancy given the degree of surrounding adenopathy. Diverticular throughout portions of the descending colon remainder of sigmoid colon. Appendix is located medial to the inflammatory process and does not appear inflamed. No primary gastric abnormality noted. Vascular/Lymphatic: No abdominal aortic aneurysm or large vessel occlusion. Inflammatory process extends along the iliac arteries. Increase number of normal size is slightly prominent size lymph nodes surrounds the sigmoid diverticulitis and abscess. Increase number of normal size retroperitoneal lymph nodes. Reproductive: Prostate gland calcifications incidentally noted. Prostate gland does not appear enlarged. Symmetric normal-appearing seminal vesicles.  Other: As above. Musculoskeletal: Stable T12 compression fracture involving superior endplate with anterior wedging and 70% loss of height centrally. Minimal retropulsion posterosuperior aspect. Mild degenerative changes L5-S1. Mild degenerative changes hip joints. IMPRESSION: Sigmoid diverticulitis with perforation and abscess within mesentery superior to the sigmoid colon. Abscess has change configuration and size with progressive superior extension as detailed above. Abscess and surrounding free fluid causes irritation of adjacent small bowel which is slightly contracted. Enlarged fatty liver spanning over 21.8 cm. 1.2 cm rounded area of enhancement posterior dome right lobe liver may represent transient hepatic attenuation difference or possibly small hemangioma. Stable appearing T12 anterior wedge compression deformity. These results will be called to the ordering clinician or representative by the Radiologist Assistant, and communication documented in the PACS or zVision Dashboard. Electronically Signed   By: Lacy Duverney M.D.   On: 03/11/2018 10:53      Jerre Simon , [facility] 03/13/2018, 8:49 AM  Pager: [phone] Mon-Wed, Friday 7:00am-4:30pm Thurs 7am-11:30am  Consults: [phone]

## 2018-03-14 LAB — CBC
HEMATOCRIT: 45.2 % (ref 39.0–52.0)
HEMOGLOBIN: 14.8 g/dL (ref 13.0–17.0)
MCH: 30 pg (ref 26.0–34.0)
MCHC: 32.7 g/dL (ref 30.0–36.0)
MCV: 91.5 fL (ref 78.0–100.0)
Platelets: 606 10*3/uL — ABNORMAL HIGH (ref 150–400)
RBC: 4.94 MIL/uL (ref 4.22–5.81)
RDW: 12.5 % (ref 11.5–15.5)
WBC: 13.2 10*3/uL — ABNORMAL HIGH (ref 4.0–10.5)

## 2018-03-14 NOTE — Progress Notes (Signed)
   Subjective/Chief Complaint: Feels better today   Objective: Vital signs in last 24 hours: Temp:  [97.9 F (36.6 C)-98 F (36.7 C)] 98 F (36.7 C) (04/19 0547) Pulse Rate:  [85-93] 93 (04/19 0547) Resp:  [18-20] 18 (04/19 0547) BP: (126-156)/(75-93) 156/75 (04/19 0547) SpO2:  [91 %-99 %] 99 % (04/19 0547) Last BM Date: 03/13/18  Intake/Output from previous day: 04/18 0701 - 04/19 0700 In: 700 [P.O.:600; IV Piggyback:100] Out: -  Intake/Output this shift: No intake/output data recorded.  General appearance: alert and cooperative Resp: clear to auscultation bilaterally Cardio: regular rate and rhythm GI: soft, minimal tenderness  Lab Results:  Recent Labs    03/13/18 0549 03/14/18 0554  WBC 13.3* 13.2*  HGB 14.9 14.8  HCT 44.6 45.2  PLT 519* 606*   BMET Recent Labs    03/13/18 0549  NA 137  K 4.8  CL 105  CO2 21*  GLUCOSE 113*  BUN 20  CREATININE 0.95  CALCIUM 8.8*   PT/INR No results for input(s): LABPROT, INR in the last 72 hours. ABG No results for input(s): PHART, HCO3 in the last 72 hours.  Invalid input(s): PCO2, PO2  Studies/Results: No results found.  Anti-infectives: Anti-infectives (From admission, onward)   Start     Dose/Rate Route Frequency Ordered Stop   03/03/18 1800  piperacillin-tazobactam (ZOSYN) IVPB 3.375 g     3.375 g 12.5 mL/hr over 240 Minutes Intravenous Every 8 hours 03/03/18 1247     03/03/18 1015  piperacillin-tazobactam (ZOSYN) IVPB 3.375 g     3.375 g 100 mL/hr over 30 Minutes Intravenous  Once 03/03/18 1012 03/03/18 1050      Assessment/Plan: s/p * No surgery found * Continue IV zosyn another day since wbc still 13.  Stay on soft diet.  Hopefully switch to orals when wbc normal and d/c  LOS: 11 days    TOTH III,Amedeo Detweiler S 03/14/2018

## 2018-03-15 LAB — CBC WITH DIFFERENTIAL/PLATELET
Basophils Absolute: 0 10*3/uL (ref 0.0–0.1)
Basophils Relative: 0 %
EOS ABS: 0.2 10*3/uL (ref 0.0–0.7)
Eosinophils Relative: 2 %
HEMATOCRIT: 44.7 % (ref 39.0–52.0)
HEMOGLOBIN: 14.9 g/dL (ref 13.0–17.0)
LYMPHS ABS: 3.4 10*3/uL (ref 0.7–4.0)
Lymphocytes Relative: 29 %
MCH: 30.3 pg (ref 26.0–34.0)
MCHC: 33.3 g/dL (ref 30.0–36.0)
MCV: 91 fL (ref 78.0–100.0)
MONOS PCT: 4 %
Monocytes Absolute: 0.5 10*3/uL (ref 0.1–1.0)
NEUTROS PCT: 65 %
Neutro Abs: 7.6 10*3/uL (ref 1.7–7.7)
Platelets: 627 10*3/uL — ABNORMAL HIGH (ref 150–400)
RBC: 4.91 MIL/uL (ref 4.22–5.81)
RDW: 12.5 % (ref 11.5–15.5)
WBC: 11.7 10*3/uL — ABNORMAL HIGH (ref 4.0–10.5)

## 2018-03-15 MED ORDER — AMOXICILLIN-POT CLAVULANATE 875-125 MG PO TABS: 1.0000 | ORAL_TABLET | Freq: Two times a day (BID) | ORAL | 0 refills | Status: DC

## 2018-03-15 MED ORDER — OXYCODONE HCL 5 MG PO TABS: 5.0000 mg | ORAL_TABLET | Freq: Four times a day (QID) | ORAL | 0 refills | Status: DC | PRN

## 2018-03-15 MED ORDER — AMOXICILLIN-POT CLAVULANATE 875-125 MG PO TABS
1.0000 | ORAL_TABLET | Freq: Two times a day (BID) | ORAL | Status: DC
  Administered 2018-03-15: 1 via ORAL
  Filled 2018-03-15: qty 1

## 2018-03-15 NOTE — Progress Notes (Signed)
Discharge instructions and medications discussed with patient.  Prescriptions and AVS given to patient. All questions answered.  

## 2018-03-15 NOTE — Progress Notes (Signed)
   Subjective/Chief Complaint: No complaints. Feels good   Objective: Vital signs in last 24 hours: Temp:  [97.5 F (36.4 C)-97.7 F (36.5 C)] 97.7 F (36.5 C) (04/20 0503) Pulse Rate:  [72-86] 78 (04/20 0503) Resp:  [19-22] 19 (04/20 0503) BP: (121-134)/(81-89) 127/81 (04/20 0503) SpO2:  [97 %] 97 % (04/20 0503) Last BM Date: 03/14/18  Intake/Output from previous day: 04/19 0701 - 04/20 0700 In: 120 [P.O.:120] Out: -  Intake/Output this shift: No intake/output data recorded.  General appearance: alert and cooperative Resp: clear to auscultation bilaterally Cardio: regular rate and rhythm GI: soft, nontender  Lab Results:  Recent Labs    03/13/18 0549 03/14/18 0554  WBC 13.3* 13.2*  HGB 14.9 14.8  HCT 44.6 45.2  PLT 519* 606*   BMET Recent Labs    03/13/18 0549  NA 137  K 4.8  CL 105  CO2 21*  GLUCOSE 113*  BUN 20  CREATININE 0.95  CALCIUM 8.8*   PT/INR No results for input(s): LABPROT, INR in the last 72 hours. ABG No results for input(s): PHART, HCO3 in the last 72 hours.  Invalid input(s): PCO2, PO2  Studies/Results: No results found.  Anti-infectives: Anti-infectives (From admission, onward)   Start     Dose/Rate Route Frequency Ordered Stop   03/15/18 1000  amoxicillin-clavulanate (AUGMENTIN) 875-125 MG per tablet 1 tablet     1 tablet Oral Every 12 hours 03/15/18 0852     03/03/18 1800  piperacillin-tazobactam (ZOSYN) IVPB 3.375 g     3.375 g 12.5 mL/hr over 240 Minutes Intravenous Every 8 hours 03/03/18 1247     03/03/18 1015  piperacillin-tazobactam (ZOSYN) IVPB 3.375 g     3.375 g 100 mL/hr over 30 Minutes Intravenous  Once 03/03/18 1012 03/03/18 1050      Assessment/Plan: s/p * No surgery found * Advance diet  Check wbc today. If under 13 then plan for discharge today Switch to oral augmentin  LOS: 12 days    TOTH III,PAUL S 03/15/2018

## 2018-03-17 ENCOUNTER — Ambulatory Visit: Payer: BLUE CROSS/BLUE SHIELD | Admitting: Family Medicine

## 2018-03-19 NOTE — Discharge Summary (Addendum)
Physician Discharge Summary  Patient ID: Paul Gilmore MRN: [id number] DOB/AGE: 43/07/1976 43 y.o.  Admit date: 03/03/2018 Discharge date: 03/15/2018  Admission Diagnoses:  Acutesigmoid diverticulitis with perforation/abscess/free air Hx chronic back pain, T12 compression fracture BMI 43.3    Discharge Diagnoses:  Same    Principal Problem:   Perforation of sigmoid colon due to diverticulitis Active Problems:   Obesity, Class III, BMI 40-49.9 (morbid obesity) (HCC)   Hyperglycemia   PROCEDURES: none  Hospital Course:  [facility] of 2 days of abdominal pain, bloating, and constipation. Patient has had a mild T12 compression fracture been present since 09/26/16. He is on chronic pain medications for this. He has used these medications to treat his abdominal pain without successat home.   Workup in the ED shows: He is afebrile but hypertensive. Glucose is 152 CMP is otherwise normal., WBC 15.8, hemoglobin 16.4, hematocrit 47.8, platelets 429,000. Urinalysis is normal. CT scan shows acomplicated diverticulitis involving the sigmoid colon with a perforated diverticulum a large inflammatory phlegmonous mass in the sigmoid mesocolon, and developing abscess.There is scattered free air throughout the abdomen. Additionally there is diffuse fatty infiltration of the liver. Patient [facility] for further evaluation and treatment. Treatment in the ED includes Zosyn, Toradol, Dilaudid, and Zofran.  Of note repeat films on 02/24/18 of the lumbar spine shows stable T12 compression deformity noted. Vertebral height was well-maintained disc space narrowing is noted in L4-L5 and L5-S1.  [facility] and seen in the ED by Dr. Johna SheriffHoxworth.  He was tender but not acutely ill.  He was placed on bowel rest, IV antibiotics, and fluid IV hydration.  He made slow  progress with some ongoing pain and low grade leukocytosis. By 03/15/18 his WBC had decreased to 11.7 from admit WBC 20.5. It was Dr. Billey Changoth's opinion he could go home on oral antibiotics and follow up with Dr. Johna SheriffHoxworth in 2 weeks.    CBC Latest Ref Rng & Units 03/15/2018 03/14/2018 03/13/2018  WBC 4.0 - 10.5 K/uL 11.7(H) 13.2(H) 13.3(H)  Hemoglobin 13.0 - 17.0 g/dL 40.914.9 81.114.8 91.414.9  Hematocrit 39.0 - 52.0 % 44.7 45.2 44.6  Platelets 150 - 400 K/uL 627(H) 606(H) 519(H)   CMP Latest Ref Rng & Units 03/13/2018 03/09/2018 03/04/2018  Glucose 65 - 99 mg/dL 782(N113(H) - 562(Z115(H)  BUN 6 - 20 mg/dL 20 - 12  Creatinine 3.080.61 - 1.24 mg/dL 6.570.95 8.461.02 9.620.91  Sodium 135 - 145 mmol/L 137 - 136  Potassium 3.5 - 5.1 mmol/L 4.8 3.9 4.4  Chloride 101 - 111 mmol/L 105 - 100(L)  CO2 22 - 32 mmol/L 21(L) - 24  Calcium 8.9 - 10.3 mg/dL 9.5(M8.8(L) - 8.6(L)  Total Protein 6.5 - 8.1 g/dL 7.3 - -  Total Bilirubin 0.3 - 1.2 mg/dL 0.5 - -  Alkaline Phos 38 - 126 U/L 71 - -  AST 15 - 41 U/L 22 - -  ALT 17 - 63 U/L 28 - -    Condition on discharge:  Improving    Disposition:   Discharge Instructions    Call MD for:  difficulty breathing, headache or visual disturbances   Complete by:  As directed    Call MD for:  extreme fatigue   Complete by:  As directed    Call MD for:  hives   Complete by:  As directed    Call MD for:  persistant dizziness or light-headedness   Complete by:  As directed    Call MD for:  persistant nausea and vomiting   Complete by:  As directed    Call MD for:  redness, tenderness, or signs of infection (pain, swelling, redness, odor or green/yellow discharge around incision site)   Complete by:  As directed    Call MD for:  severe uncontrolled pain   Complete by:  As directed    Call MD for:  temperature >100.4   Complete by:  As directed    Diet - low sodium heart healthy   Complete by:  As directed    Discharge instructions   Complete by:  As directed    May shower. Low residue diet. Activity as  tolerated   Increase activity slowly   Complete by:  As directed    No wound care   Complete by:  As directed      Allergies as of 03/15/2018   No Known Allergies     Medication List    STOP taking these medications   diazepam 5 MG tablet Commonly known as:  VALIUM   HYDROcodone-acetaminophen 5-325 MG tablet Commonly known as:  NORCO   predniSONE 10 MG tablet Commonly known as:  DELTASONE     TAKE these medications   amoxicillin-clavulanate 875-125 MG tablet Commonly known as:  AUGMENTIN Take 1 tablet by mouth every 12 (twelve) hours.   ibuprofen 200 MG tablet Commonly known as:  ADVIL,MOTRIN Take 800 mg by mouth every 6 (six) hours as needed (pain).   oxyCODONE 5 MG immediate release tablet Commonly known as:  Oxy IR/ROXICODONE Take 1-2 tablets (5-10 mg total) by mouth every 6 (six) hours as needed (5mg  for moderate pain, 10 mg for severe pain).      Follow-up Information    Glenna Fellows, MD Follow up in 2 week(s).   Specialty:  General Surgery Contact information: [address] [place] [address] [phone]           Signed: Sherrie George 03/19/2018, 1:00 PM

## 2018-03-22 ENCOUNTER — Emergency Department (HOSPITAL_COMMUNITY): Payer: BLUE CROSS/BLUE SHIELD

## 2018-03-22 ENCOUNTER — Other Ambulatory Visit: Payer: Self-pay

## 2018-03-22 ENCOUNTER — Encounter (HOSPITAL_COMMUNITY): Payer: Self-pay

## 2018-03-22 ENCOUNTER — Inpatient Hospital Stay (HOSPITAL_COMMUNITY)
Admission: EM | Admit: 2018-03-22 | Discharge: 2018-03-24 | DRG: 392 | Disposition: A | Discharge status: Home / Self Care | Payer: BLUE CROSS/BLUE SHIELD | Attending: General Surgery | Admitting: General Surgery

## 2018-03-22 DIAGNOSIS — K5720 Diverticulitis of large intestine with perforation and abscess without bleeding: Principal | ICD-10-CM | POA: Diagnosis present

## 2018-03-22 DIAGNOSIS — M4854XD Collapsed vertebra, not elsewhere classified, thoracic region, subsequent encounter for fracture with routine healing: Secondary | ICD-10-CM | POA: Diagnosis present

## 2018-03-22 DIAGNOSIS — R1032 Left lower quadrant pain: Secondary | ICD-10-CM | POA: Diagnosis not present

## 2018-03-22 DIAGNOSIS — K5792 Diverticulitis of intestine, part unspecified, without perforation or abscess without bleeding: Secondary | ICD-10-CM

## 2018-03-22 DIAGNOSIS — Z6841 Body Mass Index (BMI) 40.0 and over, adult: Secondary | ICD-10-CM | POA: Diagnosis not present

## 2018-03-22 DIAGNOSIS — R739 Hyperglycemia, unspecified: Secondary | ICD-10-CM | POA: Diagnosis present

## 2018-03-22 DIAGNOSIS — G8929 Other chronic pain: Secondary | ICD-10-CM | POA: Diagnosis not present

## 2018-03-22 DIAGNOSIS — K529 Noninfective gastroenteritis and colitis, unspecified: Secondary | ICD-10-CM | POA: Diagnosis not present

## 2018-03-22 DIAGNOSIS — I10 Essential (primary) hypertension: Secondary | ICD-10-CM | POA: Diagnosis not present

## 2018-03-22 DIAGNOSIS — M549 Dorsalgia, unspecified: Secondary | ICD-10-CM | POA: Diagnosis present

## 2018-03-22 DIAGNOSIS — Z87891 Personal history of nicotine dependence: Secondary | ICD-10-CM

## 2018-03-22 DIAGNOSIS — R109 Unspecified abdominal pain: Secondary | ICD-10-CM | POA: Diagnosis not present

## 2018-03-22 DIAGNOSIS — K5780 Diverticulitis of intestine, part unspecified, with perforation and abscess without bleeding: Secondary | ICD-10-CM

## 2018-03-22 DIAGNOSIS — R1110 Vomiting, unspecified: Secondary | ICD-10-CM | POA: Diagnosis not present

## 2018-03-22 DIAGNOSIS — K5781 Diverticulitis of intestine, part unspecified, with perforation and abscess with bleeding: Secondary | ICD-10-CM | POA: Diagnosis not present

## 2018-03-22 DIAGNOSIS — K5732 Diverticulitis of large intestine without perforation or abscess without bleeding: Secondary | ICD-10-CM | POA: Diagnosis not present

## 2018-03-22 HISTORY — DX: Unspecified intestinal obstruction, unspecified as to partial versus complete obstruction: K56.609

## 2018-03-22 HISTORY — DX: Diverticulitis of intestine, part unspecified, with perforation and abscess without bleeding: K57.80

## 2018-03-22 LAB — COMPREHENSIVE METABOLIC PANEL
ALK PHOS: 90 U/L (ref 38–126)
ALT: 43 U/L (ref 17–63)
AST: 28 U/L (ref 15–41)
Albumin: 4.2 g/dL (ref 3.5–5.0)
Anion gap: 17 — ABNORMAL HIGH (ref 5–15)
BUN: 19 mg/dL (ref 6–20)
CHLORIDE: 100 mmol/L — AB (ref 101–111)
CO2: 20 mmol/L — ABNORMAL LOW (ref 22–32)
CREATININE: 1.07 mg/dL (ref 0.61–1.24)
Calcium: 9.4 mg/dL (ref 8.9–10.3)
GFR calc Af Amer: >60 mL/min (ref >60)
Glucose, Bld: 145 mg/dL — ABNORMAL HIGH (ref 65–99)
Potassium: 4 mmol/L (ref 3.5–5.1)
Sodium: 137 mmol/L (ref 135–145)
Total Bilirubin: 1.2 mg/dL (ref 0.3–1.2)
Total Protein: 8.1 g/dL (ref 6.5–8.1)

## 2018-03-22 LAB — URINALYSIS, ROUTINE W REFLEX MICROSCOPIC
Bilirubin Urine: NEGATIVE
GLUCOSE, UA: NEGATIVE mg/dL
HGB URINE DIPSTICK: NEGATIVE
KETONES UR: 20 mg/dL — AB
LEUKOCYTES UA: NEGATIVE
Nitrite: NEGATIVE
PROTEIN: NEGATIVE mg/dL
Specific Gravity, Urine: 1.028 (ref 1.005–1.030)
pH: 5 (ref 5.0–8.0)

## 2018-03-22 LAB — CBC
HCT: 46.2 % (ref 39.0–52.0)
Hemoglobin: 15.6 g/dL (ref 13.0–17.0)
MCH: 30.4 pg (ref 26.0–34.0)
MCHC: 33.8 g/dL (ref 30.0–36.0)
MCV: 90.1 fL (ref 78.0–100.0)
PLATELETS: 545 10*3/uL — AB (ref 150–400)
RBC: 5.13 MIL/uL (ref 4.22–5.81)
RDW: 12.6 % (ref 11.5–15.5)
WBC: 28.2 10*3/uL — AB (ref 4.0–10.5)

## 2018-03-22 LAB — LIPASE, BLOOD: LIPASE: 23 U/L (ref 11–51)

## 2018-03-22 MED ORDER — ONDANSETRON HCL 4 MG/2ML IJ SOLN
4.0000 mg | Freq: Four times a day (QID) | INTRAMUSCULAR | Status: DC | PRN
  Filled 2018-03-22: qty 2

## 2018-03-22 MED ORDER — IOPAMIDOL (ISOVUE-300) INJECTION 61%
INTRAVENOUS | Status: AC
  Filled 2018-03-22: qty 100

## 2018-03-22 MED ORDER — HYDROMORPHONE HCL 1 MG/ML IJ SOLN
1.0000 mg | Freq: Once | INTRAMUSCULAR | Status: AC
  Administered 2018-03-22: 1 mg via INTRAVENOUS
  Filled 2018-03-22: qty 1

## 2018-03-22 MED ORDER — METOPROLOL TARTRATE 5 MG/5ML IV SOLN: 5.0000 mg | Freq: Four times a day (QID) | INTRAVENOUS | Status: DC | PRN

## 2018-03-22 MED ORDER — PIPERACILLIN-TAZOBACTAM 3.375 G IVPB
3.3750 g | Freq: Three times a day (TID) | INTRAVENOUS | Status: DC
  Administered 2018-03-22 – 2018-03-24 (×6): 3.375 g via INTRAVENOUS
  Filled 2018-03-22 (×6): qty 50

## 2018-03-22 MED ORDER — ENOXAPARIN SODIUM 40 MG/0.4ML ~~LOC~~ SOLN
40.0000 mg | SUBCUTANEOUS | Status: DC
  Administered 2018-03-22 – 2018-03-23 (×2): 40 mg via SUBCUTANEOUS
  Filled 2018-03-22 (×2): qty 0.4

## 2018-03-22 MED ORDER — ONDANSETRON 4 MG PO TBDP: 4.0000 mg | ORAL_TABLET | Freq: Once | ORAL | Status: DC | PRN

## 2018-03-22 MED ORDER — ACETAMINOPHEN 325 MG PO TABS
650.0000 mg | ORAL_TABLET | Freq: Four times a day (QID) | ORAL | Status: DC | PRN
  Administered 2018-03-23: 650 mg via ORAL
  Filled 2018-03-22: qty 2

## 2018-03-22 MED ORDER — OXYCODONE HCL 5 MG PO TABS
5.0000 mg | ORAL_TABLET | Freq: Four times a day (QID) | ORAL | Status: DC | PRN
  Administered 2018-03-22 – 2018-03-24 (×4): 10 mg via ORAL
  Filled 2018-03-22 (×4): qty 2

## 2018-03-22 MED ORDER — POTASSIUM CHLORIDE IN NACL 20-0.9 MEQ/L-% IV SOLN
INTRAVENOUS | Status: DC
  Administered 2018-03-22 – 2018-03-23 (×3): via INTRAVENOUS
  Filled 2018-03-22 (×4): qty 1000

## 2018-03-22 MED ORDER — ONDANSETRON 4 MG PO TBDP: 4.0000 mg | ORAL_TABLET | Freq: Four times a day (QID) | ORAL | Status: DC | PRN

## 2018-03-22 MED ORDER — ACETAMINOPHEN 650 MG RE SUPP: 650.0000 mg | Freq: Four times a day (QID) | RECTAL | Status: DC | PRN

## 2018-03-22 MED ORDER — PIPERACILLIN-TAZOBACTAM 3.375 G IVPB: 3.3750 g | Freq: Three times a day (TID) | INTRAVENOUS | Status: DC

## 2018-03-22 MED ORDER — ONDANSETRON HCL 4 MG/2ML IJ SOLN
4.0000 mg | Freq: Once | INTRAMUSCULAR | Status: AC | PRN
  Administered 2018-03-22: 4 mg via INTRAVENOUS
  Filled 2018-03-22: qty 2

## 2018-03-22 MED ORDER — PIPERACILLIN-TAZOBACTAM IN DEX 2-0.25 GM/50ML IV SOLN: 2.2500 g | Freq: Four times a day (QID) | INTRAVENOUS | Status: DC

## 2018-03-22 MED ORDER — SODIUM CHLORIDE 0.9 % IV SOLN
INTRAVENOUS | Status: DC
  Administered 2018-03-22: 14:00:00 via INTRAVENOUS

## 2018-03-22 MED ORDER — SODIUM CHLORIDE 0.9 % IJ SOLN
INTRAMUSCULAR | Status: AC
  Administered 2018-03-22: 16:00:00
  Filled 2018-03-22: qty 50

## 2018-03-22 MED ORDER — HYDROMORPHONE HCL 1 MG/ML IJ SOLN
1.0000 mg | INTRAMUSCULAR | Status: DC | PRN
  Administered 2018-03-22 – 2018-03-24 (×8): 1 mg via INTRAVENOUS
  Filled 2018-03-22 (×8): qty 1

## 2018-03-22 MED ORDER — ONDANSETRON HCL 4 MG/2ML IJ SOLN: 4.0000 mg | Freq: Once | INTRAMUSCULAR | Status: DC

## 2018-03-22 MED ORDER — IOPAMIDOL (ISOVUE-300) INJECTION 61%
100.0000 mL | Freq: Once | INTRAVENOUS | Status: AC | PRN
  Administered 2018-03-22: 100 mL via INTRAVENOUS

## 2018-03-22 MED ORDER — SODIUM CHLORIDE 0.9 % IV BOLUS
2000.0000 mL | Freq: Once | INTRAVENOUS | Status: AC
  Administered 2018-03-22: 2000 mL via INTRAVENOUS

## 2018-03-22 NOTE — ED Triage Notes (Addendum)
Patient was released from the hospital a week ago for a bowel obstruction. Patient called [facility] and was told to come to the ED. Patient is having abdominal pain and vomiting.

## 2018-03-22 NOTE — H&P (Signed)
Paul Gilmore is an 42 y.o. male.    Chief Complaint: Nausea and vomiting  HPI: Patient is a 42 year old male discharged 1 week ago after a 12-day hospitalization for perforated diverticulitis.  He presented initially with acute severe abdominal pain and CT scan showing significant diverticulitis of the sigmoid colon with air and phlegmon in the mesentery of the colon and adjacent to the bowel wall.  He was managed nonoperatively with bowel rest and antibiotics and during that hospitalization had slow steady improvement.  Prior to discharge he had a repeat CT scan showing reduced inflammatory change and now two apparent abscesses adjacent to the sigmoid colon.  They were not felt amenable to percutaneous drainage.  He was discharged clinically much improved with no significant pain or tenderness, normalized white blood count and benign abdominal exam.  He has been maintained on oral antibiotics.  He continued to do well until yesterday.  At that time he developed fairly sudden onset of frequent nausea and vomiting which has continued.  He has been unable to keep down any water.  This has been associated with some abdominal cramping but he denies any constant abdominal pain.  He has some "soreness" in his left lower quadrant which is similar to discharge but no worsening pain.  No fever or chills.  Bowel movements have been fairly normal although a little loose the last couple of days.  Of note the family member 2 days ago did have a 24-hour illness consisting of diarrhea and vomiting that resolved spontaneously.  Past Medical History:  Diagnosis Date  . Bowel obstruction ([place])     History reviewed. No pertinent surgical history.  History reviewed. No pertinent family history. Social History:  reports that he has never smoked. He has quit using smokeless tobacco. He reports that he has current or past drug history. Drug: Marijuana. He reports that he does not drink alcohol.  Allergies: No Known  Allergies  Current Facility-Administered Medications  Medication Dose Route Frequency Provider Last Rate Last Dose  . 0.9 %  sodium chloride infusion   Intravenous Continuous Lacretia Leigh, MD 125 mL/hr at 03/22/18 1400    . ondansetron (ZOFRAN) injection 4 mg  4 mg Intravenous Once Lacretia Leigh, MD      . piperacillin-tazobactam (ZOSYN) IVPB 3.375 g  3.375 g Intravenous Q8H Lacretia Leigh, MD 12.5 mL/hr at 03/22/18 1559 3.375 g at 03/22/18 1559   Current Outpatient Medications  Medication Sig Dispense Refill  . acetaminophen (TYLENOL 8 HOUR) 650 MG CR tablet Take 1,300 mg by mouth daily as needed (headache).     Marland Kitchen amoxicillin-clavulanate (AUGMENTIN) 875-125 MG tablet Take 1 tablet by mouth every 12 (twelve) hours. 30 tablet 0  . oxyCODONE (OXY IR/ROXICODONE) 5 MG immediate release tablet Take 1-2 tablets (5-10 mg total) by mouth every 6 (six) hours as needed ('5mg'$  for moderate pain, 10 mg for severe pain). 15 tablet 0     Results for orders placed or performed during the hospital encounter of 03/22/18 (from the past 48 hour(s))  Lipase, blood     Status: None   Collection Time: 03/22/18 12:33 PM  Result Value Ref Range   Lipase 23 11 - 51 U/L    Comment: Performed at [facility], [place] [address], [place], [place] [address]  Comprehensive metabolic panel     Status: Abnormal   Collection Time: 03/22/18 12:33 PM  Result Value Ref Range   Sodium 137 135 - 145 mmol/L   Potassium 4.0  3.5 - 5.1 mmol/L   Chloride 100 (L) 101 - 111 mmol/L   CO2 20 (L) 22 - 32 mmol/L   Glucose, Bld 145 (H) 65 - 99 mg/dL   BUN 19 6 - 20 mg/dL   Creatinine, Ser 1.07 0.61 - 1.24 mg/dL   Calcium 9.4 8.9 - 10.3 mg/dL   Total Protein 8.1 6.5 - 8.1 g/dL   Albumin 4.2 3.5 - 5.0 g/dL   AST 28 15 - 41 U/L   ALT 43 17 - 63 U/L   Alkaline Phosphatase 90 38 - 126 U/L   Total Bilirubin 1.2 0.3 - 1.2 mg/dL   GFR calc non Af Amer >60 >60 mL/min   GFR calc Af Amer >60 >60 mL/min    Comment:  (NOTE) The eGFR has been calculated using the CKD EPI equation. This calculation has not been validated in all clinical situations. eGFR's persistently <60 mL/min signify possible Chronic Kidney Disease.    Anion gap 17 (H) 5 - 15    Comment: Performed at [facility], [place] [address] [place], [place] [address]  CBC     Status: Abnormal   Collection Time: 03/22/18 12:33 PM  Result Value Ref Range   WBC 28.2 (H) 4.0 - 10.5 K/uL    Comment: RESULT REPEATED AND VERIFIED   RBC 5.13 4.22 - 5.81 MIL/uL   Hemoglobin 15.6 13.0 - 17.0 g/dL   HCT 46.2 39.0 - 52.0 %   MCV 90.1 78.0 - 100.0 fL   MCH 30.4 26.0 - 34.0 pg   MCHC 33.8 30.0 - 36.0 g/dL   RDW 12.6 11.5 - 15.5 %   Platelets 545 (H) 150 - 400 K/uL    Comment: Performed at [facility], [place] [address] [place], [place] [address]  Urinalysis, Routine w reflex microscopic     Status: Abnormal   Collection Time: 03/22/18  2:56 PM  Result Value Ref Range   Color, Urine YELLOW YELLOW   APPearance CLEAR CLEAR   Specific Gravity, Urine 1.028 1.005 - 1.030   pH 5.0 5.0 - 8.0   Glucose, UA NEGATIVE NEGATIVE mg/dL   Hgb urine dipstick NEGATIVE NEGATIVE   Bilirubin Urine NEGATIVE NEGATIVE   Ketones, ur 20 (A) NEGATIVE mg/dL   Protein, ur NEGATIVE NEGATIVE mg/dL   Nitrite NEGATIVE NEGATIVE   Leukocytes, UA NEGATIVE NEGATIVE    Comment: Performed at [place] [address] [place], [place] [address]   Ct Abdomen Pelvis W Contrast  Result Date: 03/22/2018 CLINICAL DATA:  Patient was released from the hospital a week ago for a bowel obstruction. Patient called [facility] and was told to come to the ED. Patient is having abdominal pain and vomiting EXAM: CT ABDOMEN AND PELVIS WITH CONTRAST TECHNIQUE: Multidetector CT imaging of the abdomen and pelvis was performed using the standard protocol following bolus administration of intravenous contrast. CONTRAST:  1107m  ISOVUE-300 IOPAMIDOL (ISOVUE-300) INJECTION 61% COMPARISON:  03/11/2018 FINDINGS: Lower chest: Clear lung bases.  Heart normal in size. Hepatobiliary: Diffuse decreased attenuation of the liver consistent with fatty infiltration. No liver mass or focal lesion. Normal gallbladder. No bile duct dilation. Pancreas: Unremarkable. No pancreatic ductal dilatation or surrounding inflammatory changes. Spleen: Normal in size without focal abnormality. Adrenals/Urinary Tract: Adrenal glands are unremarkable. Kidneys are normal, without renal calculi, focal lesion, or hydronephrosis. Bladder is unremarkable. Stomach/Bowel: There is focal inflammation with small bubbles of air above the mid sigmoid colon in the upper pelvis, spanning  6.2 x 3.9 x 4.4 cm. Slightly superior and anterior to this is a small collection measuring 2.6 x 2.3 x 2.5 cm. The area of inflammation is similar to the prior CT. The small collection has significantly decreased in size, previously measuring 4.4 x 3.9 cm transversely. Mid sigmoid colon inflammation is without significant change from the prior CT. There are few subcentimeter prominent adjacent lymph nodes. There are no other areas of colonic inflammation. Numerous other diverticula noted along the left colon. No other colonic abnormality. Normal stomach and small bowel.  Normal appendix. Vascular/Lymphatic: No vascular abnormality. No pathologically enlarged lymph nodes. Reproductive: Unremarkable. Other: No abdominal wall hernia or abnormality. No abdominopelvic ascites. Musculoskeletal: Chronic compression fracture of T12, stable from the prior CT. No acute fracture. No osteoblastic or osteolytic lesions. IMPRESSION: 1. Findings of perforated diverticulitis with focal inflammation and extraluminal air just superior to the mid sigmoid colon, similar in extent to the prior CT. The abscess noted on the prior study has decreased in size, now measuring 2.6 cm in greatest dimension. No new abscesses.  2. No other acute abnormalities. 3. Hepatic steatosis. Electronically Signed   By: Lajean Manes M.D.   On: 03/22/2018 15:14    Review of Systems  Constitutional: Negative for chills and fever.  Respiratory: Negative.   Cardiovascular: Negative.   Gastrointestinal: Positive for nausea and vomiting. Negative for abdominal pain, blood in stool, constipation and diarrhea.  Genitourinary: Negative.     Blood pressure (!) 145/100, pulse (!) 111, temperature 98.1 F (36.7 C), temperature source Oral, resp. rate 18, height '5\' 11"'$  (1.803 m), weight (!) 138.3 kg (305 lb), SpO2 99 %. Physical Exam  General: Alert, was not morbidly obese Caucasian male, in no distress Skin: Warm and dry without rash or infection. HEENT: No palpable masses or thyromegaly. Sclera nonicteric.  Lymph nodes: No cervical, supraclavicular, or inguinal nodes palpable. Lungs: Breath sounds clear and equal without increased work of breathing Cardiovascular: Regular mild tachycardia without murmur. No JVD or edema. Peripheral pulses intact. Abdomen: Obese.  No apparent distention.  Quite minimal tenderness in the left lower quadrant to deep palpation without guarding. No masses palpable. No organomegaly. No palpable hernias. Extremities: No edema or joint swelling or deformity. No chronic venous stasis changes. Neurologic: Alert and fully oriented.  No gross motor deficits.  Affect normal.   Assessment/Plan Recent hospitalization for contained perforated diverticulitis.  2 non-drainable pericolonic abscesses at discharge with plan to treat with oral antibiotics and follow-up scan.  He now presents with acute nausea and vomiting.  Also marked leukocytosis.  However he does not have any increasing pain or tenderness consistent with worsening diverticulitis and CT scan actually looks improved compared to just before discharge.  He has had a family member with similar illness and I wonder if actually this could be a gastroenteritis  unrelated to his diverticulitis.  However particularly with the severe leukocytosis I think it would be wise to bring him into the hospital for IV antibiotics and rehydration and close observation.  I do not see any indication for emergency surgery.  Edward Jolly, MD 03/22/2018, 5:33 PM

## 2018-03-22 NOTE — ED Provider Notes (Signed)
[facility]-EMERGENCY DEPT Provider Note   CSN: [id number] Arrival date & time: 03/22/18  1140     History   Chief Complaint Chief Complaint  Patient presents with  . Abdominal Pain  . Emesis    HPI Paul Gilmore is a 42 y.o. male.  This is a 42 year old male recently treated for diverticulitis with contained abscess that was treated nonsurgically presents with acute onset of left lower quadrant abdominal pain consistent with his prior diverticulitis.  He has had subjective chills but no fever.  He has had nonbilious emesis but no black or bloody stools.  Denies any symptoms concerning for renal colic.  No testicular pain or swelling.  Pain is persistent and worse with any movement.  Called his surgeon and was told to come here for further management.     Past Medical History:  Diagnosis Date  . Bowel obstruction [facility])     Patient Active Problem List   Diagnosis Date Noted  . Obesity, Class III, BMI 40-49.9 (morbid obesity) (HCC) 03/08/2018  . Perforation of sigmoid colon due to diverticulitis 03/03/2018  . Hyperglycemia 03/03/2018  . Right foot pain 06/14/2017  . Great toe pain, left 03/04/2017  . Lower back injury 09/24/2016  . Hand laceration 09/24/2016  . Neck pain 02/08/2014    History reviewed. No pertinent surgical history.      Home Medications    Prior to Admission medications   Medication Sig Start Date End Date Taking? Authorizing Provider  amoxicillin-clavulanate (AUGMENTIN) 875-125 MG tablet Take 1 tablet by mouth every 12 (twelve) hours. 03/15/18   Chevis Pretty III, MD  ibuprofen (ADVIL,MOTRIN) 200 MG tablet Take 800 mg by mouth every 6 (six) hours as needed (pain).    [provider]  oxyCODONE (OXY IR/ROXICODONE) 5 MG immediate release tablet Take 1-2 tablets (5-10 mg total) by mouth every 6 (six) hours as needed (  for moderate pain, 10 mg for severe pain). 03/15/18   Griselda Miner, MD    Family History History  reviewed. No pertinent family history.  Social History Social History   Tobacco Use  . Smoking status: Never Smoker  . Smokeless tobacco: Former Engineer, water Use Topics  . Alcohol use: No    Alcohol/week: 0.0 oz  . Drug use: Yes    Types: Marijuana    Comment: occasional     Allergies   Patient has no known allergies.   Review of Systems Review of Systems  All other systems reviewed and are negative.    Physical Exam Updated Vital Signs BP (!) 133/100   Pulse (!) 120   Resp 18   Ht 1.803 m ( )   Wt (!) 138.3 kg (305 lb)   SpO2 99%   BMI 42.54 kg/m   Physical Exam  Constitutional: He is oriented to person, place, and time. He appears well-developed and well-nourished.  Non-toxic appearance. No distress.  HENT:  Head: Normocephalic and atraumatic.  Eyes: Pupils are equal, round, and reactive to light. Conjunctivae, EOM and lids are normal.  Neck: Normal range of motion. Neck supple. No tracheal deviation present. No thyroid mass present.  Cardiovascular: Regular rhythm and normal heart sounds. Tachycardia present. Exam reveals no gallop.  No murmur heard. Pulmonary/Chest: Effort normal and breath sounds normal. No stridor. No respiratory distress. He has no decreased breath sounds. He has no wheezes. He has no rhonchi. He has no rales.  Abdominal: Soft. Normal appearance and bowel sounds are normal. He exhibits  no distension. There is tenderness in the left upper quadrant and left lower quadrant. There is guarding. There is no rigidity, no rebound and no CVA tenderness.    Musculoskeletal: Normal range of motion. He exhibits no edema or tenderness.  Neurological: He is alert and oriented to person, place, and time. He has normal strength. No cranial nerve deficit or sensory deficit. GCS eye subscore is 4. GCS verbal subscore is 5. GCS motor subscore is 6.  Skin: Skin is warm and dry. No abrasion and no rash noted.  Psychiatric: He has a normal mood and  affect. His speech is normal and behavior is normal.  Nursing note and vitals reviewed.    ED Treatments / Results  Labs (all labs ordered are listed, but only abnormal results are displayed) Labs Reviewed  LIPASE, BLOOD  COMPREHENSIVE METABOLIC PANEL  CBC  URINALYSIS, ROUTINE W REFLEX MICROSCOPIC    EKG None  Radiology No results found.  Procedures Procedures (including critical care time)  Medications Ordered in ED Medications  HYDROmorphone (DILAUDID) injection 1 mg (has no administration in time range)  ondansetron (ZOFRAN) injection 4 mg (has no administration in time range)  ondansetron (ZOFRAN) injection 4 mg (4 mg Intravenous Given 03/22/18 1229)     Initial Impression / Assessment and Plan / ED Course  I have reviewed the triage vital signs and the nursing notes.  Pertinent labs & imaging results that were available during my care of the patient were reviewed by me and considered in my medical decision making (see chart for details).     Patient treated with IV pain medication as well as fluids.  Has evidence of leukocytosis with white count of 20,000.  Abdominal CT continues to show the perforated diverticulitis.  Discussed case with Dr. Johna Sheriff who recommends starting patient on Zosyn and he will come down to admit the patient  Final Clinical Impressions(s) / ED Diagnoses   Final diagnoses:  None    ED Discharge Orders    None       Lorre Nick, MD 03/22/18 1541

## 2018-03-22 NOTE — Progress Notes (Signed)
Notified MD about patient wanting dilaudid IV for pain.   Because oxy IR causes nausea.

## 2018-03-23 DIAGNOSIS — K5781 Diverticulitis of intestine, part unspecified, with perforation and abscess with bleeding: Secondary | ICD-10-CM

## 2018-03-23 LAB — CBC
HCT: 41.6 % (ref 39.0–52.0)
Hemoglobin: 13.6 g/dL (ref 13.0–17.0)
MCH: 29.7 pg (ref 26.0–34.0)
MCHC: 32.7 g/dL (ref 30.0–36.0)
MCV: 90.8 fL (ref 78.0–100.0)
PLATELETS: 414 10*3/uL — AB (ref 150–400)
RBC: 4.58 MIL/uL (ref 4.22–5.81)
RDW: 12.8 % (ref 11.5–15.5)
WBC: 15.4 10*3/uL — ABNORMAL HIGH (ref 4.0–10.5)

## 2018-03-23 MED ORDER — AMLODIPINE BESYLATE 5 MG PO TABS
5.0000 mg | ORAL_TABLET | Freq: Every day | ORAL | Status: DC
  Administered 2018-03-23 – 2018-03-24 (×2): 5 mg via ORAL
  Filled 2018-03-23 (×2): qty 1

## 2018-03-23 MED ORDER — HYDRALAZINE HCL 20 MG/ML IJ SOLN: 20.0000 mg | INTRAMUSCULAR | Status: DC | PRN

## 2018-03-23 NOTE — Consult Note (Signed)
Medical Consultation   Paul Gilmore  ZOX:[id number]  DOB: 09-Aug-1976  DOA: 03/22/2018  PCP: Patient, No Pcp Per   Requesting physician: Dr. Johna Sheriff  Reason for consultation: Management of elevated blood pressures   History of Present Illness: Paul Gilmore is an 42 y.o. male presenting to the hospital for conservative treatment for diverticulitis. We were consulted by general surgery for management of hypertension. Pt denies any headaches, chest pain, palpitations. He states that in the past he has had high values. While in the hospital he had had several high values.  Review of Systems:  ROS As per HPI otherwise 10 point review of systems negative.   Past Medical History: Past Medical History:  Diagnosis Date  . Bowel obstruction [facility])     Past Surgical History: History reviewed. No pertinent surgical history.   Allergies:  No Known Allergies   Social History:  reports that he has never smoked. He has quit using smokeless tobacco. He reports that he has current or past drug history. Drug: Marijuana. He reports that he does not drink alcohol.   Family History: History reviewed. No pertinent family history.   Physical Exam: Vitals:   03/22/18 1958 03/23/18 0601 03/23/18 0931 03/23/18 1255  BP: (!) 141/81 (!) 145/89 (!) 148/103 (!) 144/94  Pulse: 97 87 90 89  Resp: Temp: 98.2 F (36.8 C) 98.2 F (36.8 C) 98.1 F (36.7 C) (!) 97.4 F (36.3 C)  TempSrc: Oral Oral Oral Oral  SpO2: 97% 97% 95% 95%  Weight:      Height:        Constitutional: Appearance,  Alert and awake, oriented x3, not in any acute distress. Eyes: PERLA, EOMI, irises appear normal, anicteric sclera,  ENMT: external ears and nose appear normal, normal hearing             Lips appears normal, oropharynx mucosa, tongue, posterior pharynx appear normal  Neck: neck appears normal, no masses, normal ROM, no thyromegaly, no JVD  CVS: S1-S2 clear, no murmur rubs or  gallops, no LE edema, normal pedal pulses  Respiratory:  clear to auscultation bilaterally, no wheezing, rales or rhonchi. Respiratory effort normal. No accessory muscle use.  Abdomen: soft nontender, nondistended, normal bowel sounds, no guarding, obese Musculoskeletal: : no cyanosis, clubbing or edema noted bilaterally Neuro: answers questions approriately, normal, strength, sensation Psych: judgement and insight appear normal, stable mood and affect, mental status Skin: no rashes or lesions or ulcers, no induration or nodules   Data reviewed:  I have personally reviewed following labs and imaging studies Labs:  CBC: Recent Labs  Lab 03/22/18 1233 03/23/18 0444  WBC 28.2* 15.4*  HGB 15.6 13.6  HCT 46.2 41.6  MCV 90.1 90.8  PLT 545* 414*    Basic Metabolic Panel: Recent Labs  Lab 03/22/18 1233  NA 137  K 4.0  CL 100*  CO2 20*  GLUCOSE 145*  BUN 19  CREATININE 1.07  CALCIUM 9.4   GFR Estimated Creatinine Clearance: 126.5 mL/min (by C-G formula based on SCr of 1.07 mg/dL). Liver Function Tests: Recent Labs  Lab 03/22/18 1233  AST 28  ALT 43  ALKPHOS 90  BILITOT 1.2  PROT 8.1  ALBUMIN 4.2   Recent Labs  Lab 03/22/18 1233  LIPASE 23   No results for input(s): AMMONIA in the last 168 hours. Coagulation profile No results for input(s): INR, PROTIME  in the last 168 hours.  Cardiac Enzymes: No results for input(s): CKTOTAL, CKMB, CKMBINDEX, TROPONINI in the last 168 hours. BNP: Invalid input(s): POCBNP CBG: No results for input(s): GLUCAP in the last 168 hours. D-Dimer No results for input(s): DDIMER in the last 72 hours. Hgb A1c No results for input(s): HGBA1C in the last 72 hours. Lipid Profile No results for input(s): CHOL, HDL, LDLCALC, TRIG, CHOLHDL, LDLDIRECT in the last 72 hours. Thyroid function studies No results for input(s): TSH, T4TOTAL, T3FREE, THYROIDAB in the last 72 hours.  Invalid input(s): FREET3 Anemia work up No results for  input(s): VITAMINB12, FOLATE, FERRITIN, TIBC, IRON, RETICCTPCT in the last 72 hours. Urinalysis    Component Value Date/Time   COLORURINE YELLOW 03/22/2018 1456   APPEARANCEUR CLEAR 03/22/2018 1456   LABSPEC 1.028 03/22/2018 1456   PHURINE 5.0 03/22/2018 1456   GLUCOSEU NEGATIVE 03/22/2018 1456   HGBUR NEGATIVE 03/22/2018 1456   BILIRUBINUR NEGATIVE 03/22/2018 1456   KETONESUR 20 (A) 03/22/2018 1456   PROTEINUR NEGATIVE 03/22/2018 1456   NITRITE NEGATIVE 03/22/2018 1456   LEUKOCYTESUR NEGATIVE 03/22/2018 1456     Microbiology No results found for this or any previous visit (from the past 240 hour(s)).     Inpatient Medications:   Scheduled Meds: . enoxaparin (LOVENOX) injection  40 mg Subcutaneous Q24H  . ondansetron  4 mg Intravenous Once   Continuous Infusions: . sodium chloride 125 mL/hr at 03/22/18 1400  . 0.9 % NaCl with KCl 20 mEq / L 100 mL/hr at 03/23/18 0540  . piperacillin-tazobactam (ZOSYN)  IV 3.375 g (03/23/18 0744)     Radiological Exams on Admission: Ct Abdomen Pelvis W Contrast  Result Date: 03/22/2018 CLINICAL DATA:  Patient was released from the hospital a week ago for a bowel obstruction. Patient called [facility] and was told to come to the ED. Patient is having abdominal pain and vomiting EXAM: CT ABDOMEN AND PELVIS WITH CONTRAST TECHNIQUE: Multidetector CT imaging of the abdomen and pelvis was performed using the standard protocol following bolus administration of intravenous contrast. CONTRAST:  ISOVUE-300 IOPAMIDOL (ISOVUE-300) INJECTION 61% COMPARISON:  03/11/2018 FINDINGS: Lower chest: Clear lung bases.  Heart normal in size. Hepatobiliary: Diffuse decreased attenuation of the liver consistent with fatty infiltration. No liver mass or focal lesion. Normal gallbladder. No bile duct dilation. Pancreas: Unremarkable. No pancreatic ductal dilatation or surrounding inflammatory changes. Spleen: Normal in size without focal abnormality.  Adrenals/Urinary Tract: Adrenal glands are unremarkable. Kidneys are normal, without renal calculi, focal lesion, or hydronephrosis. Bladder is unremarkable. Stomach/Bowel: There is focal inflammation with small bubbles of air above the mid sigmoid colon in the upper pelvis, spanning 6.2 x 3.9 x 4.4 cm. Slightly superior and anterior to this is a small collection measuring 2.6 x 2.3 x 2.5 cm. The area of inflammation is similar to the prior CT. The small collection has significantly decreased in size, previously measuring 4.4 x 3.9 cm transversely. Mid sigmoid colon inflammation is without significant change from the prior CT. There are few subcentimeter prominent adjacent lymph nodes. There are no other areas of colonic inflammation. Numerous other diverticula noted along the left colon. No other colonic abnormality. Normal stomach and small bowel.  Normal appendix. Vascular/Lymphatic: No vascular abnormality. No pathologically enlarged lymph nodes. Reproductive: Unremarkable. Other: No abdominal wall hernia or abnormality. No abdominopelvic ascites. Musculoskeletal: Chronic compression fracture of T12, stable from the prior CT. No acute fracture. No osteoblastic or osteolytic lesions. IMPRESSION: 1. Findings of perforated diverticulitis with focal  inflammation and extraluminal air just superior to the mid sigmoid colon, similar in extent to the prior CT. The abscess noted on the prior study has decreased in size, now measuring 2.6 cm in greatest dimension. No new abscesses. 2. No other acute abnormalities. 3. Hepatic steatosis. Electronically Signed   By: Amie Portland M.D.   On: 03/22/2018 15:14    Impression/Recommendations Active Problems:   Diverticulitis of intestine with abscess - General surgery managing  Essential hypertension - start patient on amlodipine and once able to advance diet place on low sodium diet.  Thank you for this consultation.  Our [facility] hospitalist team will follow the patient  with you.   Time Spent: 20 minutes  Penny Pia M.D. [facility] 03/23/2018, 1:46 PM

## 2018-03-23 NOTE — Progress Notes (Signed)
Patient ID: Paul Gilmore, male   DOB: 03/17/1976, 42 y.o.   MRN: [id number]     Subjective: He feels much better today.  Nausea has resolved.  Has had a bowel movement.  He has mild lower abdominal pain which he attributes to muscle soreness from previous vomiting.  Objective: Vital signs in last 24 hours: Temp:  [98.1 F (36.7 C)-98.2 F (36.8 C)] 98.1 F (36.7 C) (04/28 0931) Pulse Rate:  [87-120] 90 (04/28 0931) Resp:  [18-20] 18 (04/28 0931) BP: (133-149)/(81-103) 148/103 (04/28 0931) SpO2:  [95 %-99 %] 95 % (04/28 0931) Weight:  [138.3 kg (305 lb)] 138.3 kg (305 lb) (04/27 1212)    Intake/Output from previous day: 04/27 0701 - 04/28 0700 In: 3520 [I.V.:1470; IV Piggyback:2050] Out: 850 [Urine:850] Intake/Output this shift: No intake/output data recorded.  General appearance: alert, cooperative, no distress and morbidly obese GI: Abdomen is obese but I cannot elicit any significant tenderness.  Lab Results:  Recent Labs    03/22/18 1233 03/23/18 0444  WBC 28.2* 15.4*  HGB 15.6 13.6  HCT 46.2 41.6  PLT 545* 414*   BMET Recent Labs    03/22/18 1233  NA 137  K 4.0  CL 100*  CO2 20*  GLUCOSE 145*  BUN 19  CREATININE 1.07  CALCIUM 9.4     Studies/Results: Ct Abdomen Pelvis W Contrast  Result Date: 03/22/2018 CLINICAL DATA:  Patient was released from the hospital a week ago for a bowel obstruction. Patient called [facility] and was told to come to the ED. Patient is having abdominal pain and vomiting EXAM: CT ABDOMEN AND PELVIS WITH CONTRAST TECHNIQUE: Multidetector CT imaging of the abdomen and pelvis was performed using the standard protocol following bolus administration of intravenous contrast. CONTRAST:  ISOVUE-300 IOPAMIDOL (ISOVUE-300) INJECTION 61% COMPARISON:  03/11/2018 FINDINGS: Lower chest: Clear lung bases.  Heart normal in size. Hepatobiliary: Diffuse decreased attenuation of the liver consistent with fatty infiltration. No liver  mass or focal lesion. Normal gallbladder. No bile duct dilation. Pancreas: Unremarkable. No pancreatic ductal dilatation or surrounding inflammatory changes. Spleen: Normal in size without focal abnormality. Adrenals/Urinary Tract: Adrenal glands are unremarkable. Kidneys are normal, without renal calculi, focal lesion, or hydronephrosis. Bladder is unremarkable. Stomach/Bowel: There is focal inflammation with small bubbles of air above the mid sigmoid colon in the upper pelvis, spanning 6.2 x 3.9 x 4.4 cm. Slightly superior and anterior to this is a small collection measuring 2.6 x 2.3 x 2.5 cm. The area of inflammation is similar to the prior CT. The small collection has significantly decreased in size, previously measuring 4.4 x 3.9 cm transversely. Mid sigmoid colon inflammation is without significant change from the prior CT. There are few subcentimeter prominent adjacent lymph nodes. There are no other areas of colonic inflammation. Numerous other diverticula noted along the left colon. No other colonic abnormality. Normal stomach and small bowel.  Normal appendix. Vascular/Lymphatic: No vascular abnormality. No pathologically enlarged lymph nodes. Reproductive: Unremarkable. Other: No abdominal wall hernia or abnormality. No abdominopelvic ascites. Musculoskeletal: Chronic compression fracture of T12, stable from the prior CT. No acute fracture. No osteoblastic or osteolytic lesions. IMPRESSION: 1. Findings of perforated diverticulitis with focal inflammation and extraluminal air just superior to the mid sigmoid colon, similar in extent to the prior CT. The abscess noted on the prior study has decreased in size, now measuring 2.6 cm in greatest dimension. No new abscesses. 2. No other acute abnormalities. 3. Hepatic steatosis. Electronically Signed  By: Amie Portland M.D.   On: 03/22/2018 15:14    Anti-infectives: Anti-infectives (From admission, onward)   Start     Dose/Rate Route Frequency Ordered  Stop   03/22/18 1900  piperacillin-tazobactam (ZOSYN) IVPB 3.375 g  Status:  Discontinued     3.375 g 12.5 mL/hr over 240 Minutes Intravenous Every 8 hours 03/22/18 1846 03/22/18 1926   03/22/18 1600  piperacillin-tazobactam (ZOSYN) IVPB 3.375 g     3.375 g 12.5 mL/hr over 240 Minutes Intravenous Every 8 hours 03/22/18 1553     03/22/18 1545  piperacillin-tazobactam (ZOSYN) IVPB 2.25 g  Status:  Discontinued     2.25 g 100 mL/hr over 30 Minutes Intravenous Every 6 hours 03/22/18 1541 03/22/18 1553      Assessment/Plan: Recent hospitalization for contained perforated sigmoid diverticulitis with 2 pericolonic collections.  Discharged on oral antibiotics doing well.  Re-presented with severe nausea and vomiting but not increased pain.  Had significant leukocytosis.  CT shows some improvement in his diverticulitis.  He had a family member with a similar illness and I suspect this was a viral gastroenteritis on top of his diverticulitis with the latter not really getting any worse and possibly improving.  Advance diet today.  Continue IV Zosyn for today and repeat CBC tomorrow. Hypertension.  He has persistent significant elevated BP despite IV Lopressor.  I am going to ask the medical team to evaluate for management.    LOS: 1 day    Mariella Saa 03/23/2018

## 2018-03-24 DIAGNOSIS — I10 Essential (primary) hypertension: Secondary | ICD-10-CM

## 2018-03-24 LAB — CBC
HEMATOCRIT: 43.2 % (ref 39.0–52.0)
HEMOGLOBIN: 14.1 g/dL (ref 13.0–17.0)
MCH: 29.8 pg (ref 26.0–34.0)
MCHC: 32.6 g/dL (ref 30.0–36.0)
MCV: 91.3 fL (ref 78.0–100.0)
Platelets: 396 10*3/uL (ref 150–400)
RBC: 4.73 MIL/uL (ref 4.22–5.81)
RDW: 12.9 % (ref 11.5–15.5)
WBC: 8.9 10*3/uL (ref 4.0–10.5)

## 2018-03-24 MED ORDER — CIPROFLOXACIN HCL 500 MG PO TABS
500.0000 mg | ORAL_TABLET | Freq: Two times a day (BID) | ORAL | Status: DC
  Administered 2018-03-24: 500 mg via ORAL
  Filled 2018-03-24: qty 1

## 2018-03-24 MED ORDER — CIPROFLOXACIN HCL 500 MG PO TABS: 500.0000 mg | ORAL_TABLET | Freq: Two times a day (BID) | ORAL | 0 refills | Status: AC

## 2018-03-24 MED ORDER — AMLODIPINE BESYLATE 5 MG PO TABS: 5.0000 mg | ORAL_TABLET | Freq: Every day | ORAL | 0 refills | Status: DC

## 2018-03-24 MED ORDER — SACCHAROMYCES BOULARDII 250 MG PO CAPS: ORAL_CAPSULE | ORAL | Status: DC

## 2018-03-24 MED ORDER — SACCHAROMYCES BOULARDII 250 MG PO CAPS
250.0000 mg | ORAL_CAPSULE | Freq: Two times a day (BID) | ORAL | Status: DC
  Administered 2018-03-24: 250 mg via ORAL
  Filled 2018-03-24: qty 1

## 2018-03-24 MED ORDER — METRONIDAZOLE 500 MG PO TABS
500.0000 mg | ORAL_TABLET | Freq: Three times a day (TID) | ORAL | Status: DC
  Administered 2018-03-24: 500 mg via ORAL
  Filled 2018-03-24: qty 1

## 2018-03-24 MED ORDER — METRONIDAZOLE 500 MG PO TABS: 500.0000 mg | ORAL_TABLET | Freq: Three times a day (TID) | ORAL | 0 refills | Status: AC

## 2018-03-24 NOTE — Plan of Care (Signed)
Adequate for d/c

## 2018-03-24 NOTE — Progress Notes (Signed)
CC:  Abdominal pain nausea, vomiting and leukocytosis  Subjective: He is feeling much better, just sore right now, tolerating a soft diet.  Minimal discomfort on palpation.  Objective: Vital signs in last 24 hours: Temp:  [97.4 F (36.3 C)-98.8 F (37.1 C)] 97.5 F (36.4 C) (04/29 0459) Pulse Rate:  [70-90] 77 (04/29 0538) Resp:  [16-19] 19 (04/29 0459) BP: (144-157)/(72-101) 146/95 (04/29 0538) SpO2:  [95 %-98 %] 98 % (04/29 0459) Last BM Date: 03/22/18 960 PO 2450 IV 700 urine\stool x 1 Afebrile, VSS BP still up Repeat CT 4/27:  there is focal inflammation with small bubbles of air above the mid sigmoid colon in the upper pelvis, spanning 6.2 x 3.9 x 4.4 cm. Slightly superior and anterior to this is a small collection measuring 2.6 x 2.3 x 2.5 cm. The area of inflammation is similar to the prior CT. The small collection has significantly decreased in size, previously measuring 4.4 x 3.9 cm transversely. Mid sigmoid colon inflammation is without significant change from the prior CT. There are few subcentimeter prominent adjacent lymph nodes.    Intake/Output from previous day: 04/28 0701 - 04/29 0700 In: 3410 [P.O.:960; I.V.:2300; IV Piggyback:150] Out: 700 [Urine:700] Intake/Output this shift: No intake/output data recorded.  General appearance: alert, cooperative and no distress Resp: clear to auscultation bilaterally GI: Soft, minimal abdominal soreness.  Tolerating soft diet.  Lab Results:  Recent Labs    03/23/18 0444 03/24/18 0445  WBC 15.4* 8.9  HGB 13.6 14.1  HCT 41.6 43.2  PLT 414* 396    BMET Recent Labs    03/22/18 1233  NA 137  K 4.0  CL 100*  CO2 20*  GLUCOSE 145*  BUN 19  CREATININE 1.07  CALCIUM 9.4   PT/INR No results for input(s): LABPROT, INR in the last 72 hours.  Recent Labs  Lab 03/22/18 1233  AST 28  ALT 43  ALKPHOS 90  BILITOT 1.2  PROT 8.1  ALBUMIN 4.2     Lipase     Component Value Date/Time   LIPASE 23 03/22/2018 1233     Medications: . amLODipine  5 mg Oral Daily  . enoxaparin (LOVENOX) injection  40 mg Subcutaneous Q24H  . ondansetron  4 mg Intravenous Once   . sodium chloride 125 mL/hr at 03/22/18 1400  . 0.9 % NaCl with KCl 20 mEq / L 100 mL/hr at 03/24/18 0600  . piperacillin-tazobactam (ZOSYN)  IV 3.375 g (03/24/18 0754)   Anti-infectives (From admission, onward)   Start     Dose/Rate Route Frequency Ordered Stop   03/22/18 1900  piperacillin-tazobactam (ZOSYN) IVPB 3.375 g  Status:  Discontinued     3.375 g 12.5 mL/hr over 240 Minutes Intravenous Every 8 hours 03/22/18 1846 03/22/18 1926   03/22/18 1600  piperacillin-tazobactam (ZOSYN) IVPB 3.375 g     3.375 g 12.5 mL/hr over 240 Minutes Intravenous Every 8 hours 03/22/18 1553     03/22/18 1545  piperacillin-tazobactam (ZOSYN) IVPB 2.25 g  Status:  Discontinued     2.25 g 100 mL/hr over 30 Minutes Intravenous Every 6 hours 03/22/18 1541 03/22/18 1553      Assessment/Plan Hx chronic back pain, T12 compression fracture BMI 43.3  Perforated sigmoid diverticulitis with hospitalized 4/8 - 03/15/18 Acute nausea/vomiting - ? Gastroenteritis/leukocytosis 28.2 > 8.9 today  FEN: Zosyn 4/27 =>>day 3 DVT:  Lovenox Follow up:  Dr. Johna Sheriff   Plan: Going to convert him to p.o. antibiotics again.  Dr. Cliffton Asters wants  to send him home on 2 weeks of Cipro and Flagyl.  He already has a follow-up appointment with Dr. Johna Sheriff on 04/04/2018.  He will keep that appointment.  If he does well with p.o.'s and Cipro and Flagyl will plan to discharge him home later this afternoon.  I have added probiotic.      LOS: 2 days    Haydon Dorris 03/24/2018 [phone]

## 2018-03-24 NOTE — Progress Notes (Signed)
  PROGRESS NOTE  Paul Gilmore ZOX:[id number] DOB: 20-Jun-1976 DOA: 03/22/2018 PCP: Patient, No Pcp Per  Brief Narrative: 42 year old man admitted by general surgery for recurrent diverticulitis.  Medicine consulted for hypertension.  Assessment/Plan Essential hypertension.  Patient reports history of elevated blood pressures.  Reviewing chart, he has had multiple instances of elevated blood pressure dating back to 2017 consistent with diagnosis of essential hypertension. --Recommend discharge on amlodipine as started yesterday --I discussed with patient that he should contact his insurance company for list of primary care physicians in his area and that he should establish with a primary care physician to follow-up his blood pressure within the next month.  He agreed. --Given his elevated blood pressure over at least the last 2 years, would not expect a quick reduction nor with this be particularly desirable.  Elevated blood sugars.  In the context of acute infection.  Recommend further evaluation in the outpatient setting when he has recovered.  Defer to primary care physician  Diverticulitis --Per surgery   Summary: Recommend discharge on amlodipine 5 mg daily.  Patient has been advised to establish with a primary care physician in follow-up within the next month.  If a second agent is needed, consider diuretic as an outpatient.  Will sign off, please call me if I can be of further assistance.   Brendia Sacks, MD  [facility] Direct contact: [phone] --Via amion app OR  --[email or web address]; password [email or web address]  7PM-7AM contact night coverage as above 03/24/2018, 12:25 PM  LOS: 2 days   Interval history/Subjective: Feels fine.  Reports being told he has had high blood pressure for some time.  Objective: Vitals:  Vitals:   03/24/18 0538 03/24/18 1012  BP: (!) 146/95 (!) 156/114  Pulse: 77 86  Resp:  16  Temp:  98 F (36.7 C)  SpO2:  96%    Exam:  Constitutional:   . Appears calm and comfortable Respiratory:  . CTA bilaterally, no w/r/r.  . Respiratory effort normal.  Cardiovascular:  . RRR, no m/r/g Psychiatric:  . Mental status o Mood, affect appropriate  I have personally reviewed the following:   Labs:  Basic metabolic panel and hepatic function panel unremarkable on admission with the exception of random glucose 145.   Scheduled Meds: . amLODipine  5 mg Oral Daily  . ciprofloxacin  500 mg Oral BID  . enoxaparin (LOVENOX) injection  40 mg Subcutaneous Q24H  . metroNIDAZOLE  500 mg Oral Q8H  . ondansetron  4 mg Intravenous Once  . saccharomyces boulardii  250 mg Oral BID   Continuous Infusions:  Active Problems:   Diverticulitis of intestine with abscess   LOS: 2 days

## 2018-03-24 NOTE — Progress Notes (Signed)
Patient discharged home. All Rx and instructions given to the patient, he verbalized understanding and all questions were answered. The patient left the floor via wheelchair accompanied by staff, in stable condition with all belongings.

## 2018-03-24 NOTE — Discharge Instructions (Signed)
Diverticulitis °Diverticulitis is infection or inflammation of small pouches (diverticula) in the colon that form due to a condition called diverticulosis. Diverticula can trap stool (feces) and bacteria, causing infection and inflammation. °Diverticulitis may cause severe stomach pain and diarrhea. It may lead to tissue damage in the colon that causes bleeding. The diverticula may also burst (rupture) and cause infected stool to enter other areas of the abdomen. °Complications of diverticulitis can include: °· Bleeding. °· Severe infection. °· Severe pain. °· Rupture (perforation) of the colon. °· Blockage (obstruction) of the colon. ° °What are the causes? °This condition is caused by stool becoming trapped in the diverticula, which allows bacteria to grow in the diverticula. This leads to inflammation and infection. °What increases the risk? °You are more likely to develop this condition if: °· You have diverticulosis. The risk for diverticulosis increases if: °? You are overweight or obese. °? You use tobacco products. °? You do not get enough exercise. °· You eat a diet that does not include enough fiber. High-fiber foods include fruits, vegetables, beans, nuts, and whole grains. ° °What are the signs or symptoms? °Symptoms of this condition may include: °· Pain and tenderness in the abdomen. The pain is normally located on the left side of the abdomen, but it may occur in other areas. °· Fever and chills. °· Bloating. °· Cramping. °· Nausea. °· Vomiting. °· Changes in bowel routines. °· Blood in your stool. ° °How is this diagnosed? °This condition is diagnosed based on: °· Your medical history. °· A physical exam. °· Tests to make sure there is nothing else causing your condition. These tests may include: °? Blood tests. °? Urine tests. °? Imaging tests of the abdomen, including X-rays, ultrasounds, MRIs, or CT scans. ° °How is this treated? °Most cases of this condition are mild and can be treated at home.  Treatment may include: °· Taking over-the-counter pain medicines. °· Following a clear liquid diet. °· Taking antibiotic medicines by mouth. °· Rest. ° °More severe cases may need to be treated at a hospital. Treatment may include: °· Not eating or drinking. °· Taking prescription pain medicine. °· Receiving antibiotic medicines through an IV tube. °· Receiving fluids and nutrition through an IV tube. °· Surgery. ° °When your condition is under control, your health care provider may recommend that you have a colonoscopy. This is an exam to look at the entire large intestine. During the exam, a lubricated, bendable tube is inserted into the anus and then passed into the rectum, colon, and other parts of the large intestine. A colonoscopy can show how severe your diverticula are and whether something else may be causing your symptoms. °Follow these instructions at home: °Medicines °· Take over-the-counter and prescription medicines only as told by your health care provider. These include fiber supplements, probiotics, and stool softeners. °· If you were prescribed an antibiotic medicine, take it as told by your health care provider. Do not stop taking the antibiotic even if you start to feel better. °· Do not drive or use heavy machinery while taking prescription pain medicine. °General instructions °· Follow a full liquid diet or another diet as directed by your health care provider. After your symptoms improve, your health care provider may tell you to change your diet. He or she may recommend that you eat a diet that contains at least 25 g (25 grams) of fiber daily. Fiber makes it easier to pass stool. Healthy sources of fiber include: °? Berries. One cup   contains 4-8 grams of fiber. °? Beans or lentils. One half cup contains 5-8 grams of fiber. °? Green vegetables. One cup contains 4 grams of fiber. °· Exercise for at least 30 minutes, 3 times each week. You should exercise hard enough to raise your heart rate and  break a sweat. °· Keep all follow-up visits as told by your health care provider. This is important. You may need a colonoscopy. °Contact a health care provider if: °· Your pain does not improve. °· You have a hard time drinking or eating food. °· Your bowel movements do not return to normal. °Get help right away if: °· Your pain gets worse. °· Your symptoms do not get better with treatment. °· Your symptoms suddenly get worse. °· You have a fever. °· You vomit more than one time. °· You have stools that are bloody, black, or tarry. °Summary °· Diverticulitis is infection or inflammation of small pouches (diverticula) in the colon that form due to a condition called diverticulosis. Diverticula can trap stool (feces) and bacteria, causing infection and inflammation. °· You are at higher risk for this condition if you have diverticulosis and you eat a diet that does not include enough fiber. °· Most cases of this condition are mild and can be treated at home. More severe cases may need to be treated at a hospital. °· When your condition is under control, your health care provider may recommend that you have an exam called a colonoscopy. This exam can show how severe your diverticula are and whether something else may be causing your symptoms. °This information is not intended to replace advice given to you by your health care provider. Make sure you discuss any questions you have with your health care provider. °Document Released: 08/22/2005 Document Revised: 12/15/2016 Document Reviewed: 12/15/2016 °Elsevier Interactive Patient Education © 2018 [employer] ° °

## 2018-03-25 ENCOUNTER — Ambulatory Visit: Payer: BLUE CROSS/BLUE SHIELD | Admitting: Family Medicine

## 2018-03-31 ENCOUNTER — Encounter: Payer: Self-pay | Admitting: Family Medicine

## 2018-03-31 ENCOUNTER — Ambulatory Visit: Payer: BLUE CROSS/BLUE SHIELD | Admitting: Family Medicine

## 2018-03-31 DIAGNOSIS — M546 Pain in thoracic spine: Secondary | ICD-10-CM

## 2018-03-31 MED ORDER — OXYCODONE HCL 7.5 MG PO TABS: 1.0000 | ORAL_TABLET | Freq: Four times a day (QID) | ORAL | 0 refills | Status: DC | PRN

## 2018-03-31 MED ORDER — OXYCODONE HCL 5 MG PO TABS: 5.0000 mg | ORAL_TABLET | Freq: Four times a day (QID) | ORAL | 0 refills | Status: DC | PRN

## 2018-03-31 MED FILL — oxyCODONE HCL 5 MG TABS: 5 | 8 days supply | Qty: 30 | Fill #0

## 2018-03-31 NOTE — Patient Instructions (Signed)
We will go ahead with an MRI of your thoracic spine. Start physical therapy for thoracic strain, rhomboid strain. Tylenol  1-2 tabs three times a day MAX. Ask the surgeon if you're cleared to start taking anti-inflammatories. Oxycodone as needed for severe pain. Topical capsaicin, biofreeze, and/or salon pas patches for pain. I will call you with the MRI results and next steps. Follow up with me in 2 weeks.

## 2018-04-01 ENCOUNTER — Encounter: Payer: Self-pay | Admitting: Family Medicine

## 2018-04-01 DIAGNOSIS — M546 Pain in thoracic spine: Secondary | ICD-10-CM | POA: Insufficient documentation

## 2018-04-01 NOTE — Assessment & Plan Note (Signed)
likely due to thoracic, rhomboid strains but has history of compression fracture of T12.  Radiographs of thoracic spine were negative - will go ahead with MRI to further assess.  Tylenol, oxycodone, topical medications reviewed.  F/u in 2 weeks.

## 2018-04-01 NOTE — Progress Notes (Addendum)
PCP: Patient, No Pcp Per  Subjective:   HPI: Patient is a 42 y.o. male here for back pain.  2/26: Patient reports on 2/24 he was coming down ramp in front of his house, feet slid out from under him and he landed hard onto left side of back and lateral upper hip. Immediate pain that has persisted at 9/10 level, sharp. Tried heating pad, ibuprofen. Pain worse with valsalva including coughing, sneezing, straining to use bathroom. Difficulty sleeping due to pain. Has history of compression fracture of T12. No radiation into legs. No numbness/tingling. No bowel/bladder dysfunction, only pain in left side back with straining.  4/1: Patient returns with 10/10 level sharp pain now on the right side of his back from thoracic down to lumbar spine areas. No radiation into legs. Left side not as bad as last visit but reports pain has persisted since his last visit. Worse with coughing. Using heating pad and taking voltaren without much help. No bowel/bladder dysfunction. No numbness.  5/6: Patient reports he's been doing well since discharge from hospital most recent time for ruptured diverticulitis. Still on antibiotics, most recent CT showed good improvement per report. However he's dealing with right side thoracic back pain at 9/10 level, sharp. Not taking anything for this now. Sitting in recliner. Worse with breathing but no cough, shortness of breath. No numbness, radiation into arms.  Past Medical History:  Diagnosis Date  . Bowel obstruction [facility])     Current Outpatient Medications on File Prior to Visit  Medication Sig Dispense Refill  . acetaminophen (TYLENOL 8 HOUR) 650 MG CR tablet Take 1,300 mg by mouth daily as needed (headache).     Marland Kitchen amLODipine (NORVASC) 5 MG tablet Take 1 tablet (5 mg total) by mouth daily. 30 tablet 0  . ciprofloxacin (CIPRO) 500 MG tablet Take 1 tablet (500 mg total) by mouth 2 (two) times daily for 14 days. 28 tablet 0  . metroNIDAZOLE (FLAGYL)  500 MG tablet Take 1 tablet (500 mg total) by mouth every 8 (eight) hours for 14 days. 42 tablet 0  . saccharomyces boulardii (FLORASTOR) 250 MG capsule You can buy this over the counter at any drug store, it is with the other stool related medicines.  It does not matter which one you use, but I would stay on them until you are off antibiotics completely.  Follow package directions for daily dose.     No current facility-administered medications on file prior to visit.     History reviewed. No pertinent surgical history.  No Known Allergies  Social History   Socioeconomic History  . Marital status: Married    Spouse name: Not on file  . Number of children: Not on file  . Years of education: Not on file  . Highest education level: Not on file  Occupational History  . Not on file  Social Needs  . Financial resource strain: Not on file  . Food insecurity:    Worry: Not on file    Inability: Not on file  . Transportation needs:    Medical: Not on file    Non-medical: Not on file  Tobacco Use  . Smoking status: Never Smoker  . Smokeless tobacco: Former Engineer, water and Sexual Activity  . Alcohol use: No    Alcohol/week: 0.0 oz  . Drug use: Yes    Types: Marijuana    Comment: occasional  . Sexual activity: Yes  Lifestyle  . Physical activity:    Days per week:  Not on file    Minutes per session: Not on file  . Stress: Not on file  Relationships  . Social connections:    Talks on phone: Not on file    Gets together: Not on file    Attends religious service: Not on file    Active member of club or organization: Not on file    Attends meetings of clubs or organizations: Not on file    Relationship status: Not on file  . Intimate partner violence:    Fear of current or ex partner: Not on file    Emotionally abused: Not on file    Physically abused: Not on file    Forced sexual activity: Not on file  Other Topics Concern  . Not on file  Social History Narrative  .  Not on file    History reviewed. No pertinent family history.  BP (!) 139/99   Pulse 92   Ht  (1.803 m)   Wt (!) 302 lb (137 kg)   BMI 42.12 kg/m   Review of Systems: See HPI above.     Objective:  Physical Exam:  Gen: NAD, comfortable in exam room  Back: No gross deformity, scoliosis. TTP midline and right paraspinal thoracic region.  No other tenderness including lumbar spine. FROM with pain on simulated bench press and scapular squeeze. Strength LEs 5/5 all muscle groups upper and lower extremities.   Negative SLRs. Sensation intact to light touch bilaterally.   Assessment & Plan:  1. Thoracic back pain - likely due to thoracic, rhomboid strains but has history of compression fracture of T12.  Radiographs of thoracic spine were negative - will go ahead with MRI to further assess.  Tylenol, oxycodone, topical medications reviewed.  F/u in 2 weeks.  Addendum:  MRI reviewed and discussed with patient.  No evidence new compression fracture, other bony or disc abnormalities to account for his pain, consistent with thoracic and rhomboid strains.  He abdominal pain is improving as well.  He will start PT tomorrow - would like him to get 2 weeks of PT in then plan to see him back in the office, suspect he will return to full duty at that time - f/u appt made for 6/3 at 9am

## 2018-04-02 NOTE — Discharge Summary (Signed)
Physician Discharge Summary  Patient ID: Paul Gilmore MRN: [id number] DOB/AGE: 02/23/1976 42 y.o.  Admit date: 03/22/2018 Discharge date: 04/02/2018  Admission Diagnoses:  Recurrent Abdominal pain, leukocytosis, perforated diverticulitis vs gastroenteritsis Perforated sigmoid diverticulitis with hospitalized 4/8 - 03/15/18 Acute nausea/vomiting - ? Gastroenteritis/leukocytosis 28.2 > 8.9 today Hx chronic back pain, T12 compression fracture BMI 43.3     Discharge Diagnoses:  Same   Active Problems:   Diverticulitis of intestine with abscess   PROCEDURES:  None   Hospital Course:  Patient is a 42 year old male discharged 1 week ago after a 12-day hospitalization for perforated diverticulitis.  He presented initially with acute severe abdominal pain and CT scan showing significant diverticulitis of the sigmoid colon with air and phlegmon in the mesentery of the colon and adjacent to the bowel wall.  He was managed nonoperatively with bowel rest and antibiotics and during that hospitalization had slow steady improvement.  Prior to discharge he had a repeat CT scan showing reduced inflammatory change and now two apparent abscesses adjacent to the sigmoid colon.  They were not felt amenable to percutaneous drainage.  He was discharged clinically much improved with no significant pain or tenderness, normalized white blood count and benign abdominal exam.  He has been maintained on oral antibiotics.  He continued to do well until yesterday.  At that time he developed fairly sudden onset of frequent nausea and vomiting which has continued.  He has been unable to keep down any water.  This has been associated with some abdominal cramping but he denies any constant abdominal pain.  He has some "soreness" in his left lower quadrant which is similar to discharge but no worsening pain.  No fever or chills.  Bowel movements have been fairly normal although a little loose the last couple of days.  Of note the  family member 2 days ago did have a 24-hour illness consisting of diarrhea and vomiting that resolved spontaneously.  Pt was admitted and started on antibiotics.  He felt much better the 1st AM after readmit on 03/23/18.  His repeat CT showed improvement of his diverticulitis from last admit and it was Dr. Jamse Mead opinion this was an exacerbation secondary to gastroenteritis.  He remained hypertensive and Medicine consult was obtained.   He was placed on Norvasc. Second day he continued to improve and was ready for discharge home again.  He was maintained on an additional 2 weeks of Cipro/Flagyl.  Follow up on 04/04/18 with Dr. Johna Sheriff.    CBC Latest Ref Rng & Units 03/24/2018 03/23/2018 03/22/2018  WBC 4.0 - 10.5 K/uL 8.9 15.4(H) 28.2(H)  Hemoglobin 13.0 - 17.0 g/dL 40.9 81.1 91.4  Hematocrit 39.0 - 52.0 % 43.2 41.6 46.2  Platelets 150 - 400 K/uL 396 414(H) 545(H)   CMP Latest Ref Rng & Units 03/22/2018 03/13/2018 03/09/2018  Glucose 65 - 99 mg/dL 782(N) 562(Z) -  BUN 6 - 20 mg/dL 19 20 -  Creatinine 3.08 - 1.24 mg/dL 6.57 8.46 9.62  Sodium 135 - 145 mmol/L 137 137 -  Potassium 3.5 - 5.1 mmol/L 4.0 4.8 3.9  Chloride 101 - 111 mmol/L 100(L) 105 -  CO2 22 - 32 mmol/L 20(L) 21(L) -  Calcium 8.9 - 10.3 mg/dL 9.4 9.5(M) -  Total Protein 6.5 - 8.1 g/dL 8.1 7.3 -  Total Bilirubin 0.3 - 1.2 mg/dL 1.2 0.5 -  Alkaline Phos 38 - 126 U/L 90 71 -  AST 15 - 41 U/L 28 22 -  ALT 17 -  63 U/L 43 28 -   Condition on d/c:  Improved     Disposition: Home   Allergies as of 03/24/2018   No Known Allergies     Medication List    STOP taking these medications   amoxicillin-clavulanate 875-125 MG tablet Commonly known as:  AUGMENTIN     TAKE these medications   amLODipine 5 MG tablet Commonly known as:  NORVASC Take 1 tablet (5 mg total) by mouth daily.   ciprofloxacin 500 MG tablet Commonly known as:  CIPRO Take 1 tablet (500 mg total) by mouth 2 (two) times daily for 14 days.    metroNIDAZOLE 500 MG tablet Commonly known as:  FLAGYL Take 1 tablet (500 mg total) by mouth every 8 (eight) hours for 14 days.   saccharomyces boulardii 250 MG capsule Commonly known as:  FLORASTOR You can buy this over the counter at any drug store, it is with the other stool related medicines.  It does not matter which one you use, but I would stay on them until you are off antibiotics completely.  Follow package directions for daily dose.   TYLENOL 8 HOUR 650 MG CR tablet Generic drug:  acetaminophen Take 1,300 mg by mouth daily as needed (headache).      Follow-up Information    Glenna Fellows, MD Follow up.   Specialty:  General Surgery Why:  Keep your current follow up appointment.   Take your blood pressure at home 4 times a day and take to your primary care doctor in 1-2 weeks to access your blood pressure.  I would call and get in as soon as they can schedule you.   Contact information: [address] [place] [address] [phone]           Signed: Sherrie George 04/02/2018, 2:28 PM

## 2018-04-04 ENCOUNTER — Telehealth: Payer: Self-pay | Admitting: Family Medicine

## 2018-04-04 DIAGNOSIS — K5780 Diverticulitis of intestine, part unspecified, with perforation and abscess without bleeding: Secondary | ICD-10-CM | POA: Diagnosis not present

## 2018-04-04 MED ORDER — DICLOFENAC SODIUM 75 MG PO TBEC: 75.0000 mg | DELAYED_RELEASE_TABLET | Freq: Two times a day (BID) | ORAL | 1 refills | Status: DC

## 2018-04-04 NOTE — Telephone Encounter (Signed)
Patient was informed.

## 2018-04-04 NOTE — Telephone Encounter (Signed)
Patient has been cleared to take anti-inflammatories  Pharmacy: [facility] on [address] in [place]

## 2018-04-04 NOTE — Telephone Encounter (Signed)
Script sent in for voltaren.  Thanks!

## 2018-04-08 NOTE — Addendum Note (Signed)
Addended by: Kathi Simpers F on: 04/08/2018 09:26 AM   Modules accepted: Orders

## 2018-04-08 NOTE — Addendum Note (Signed)
Addended by: Kathi Simpers F on: 04/08/2018 09:14 AM   Modules accepted: Orders

## 2018-04-12 ENCOUNTER — Ambulatory Visit (HOSPITAL_BASED_OUTPATIENT_CLINIC_OR_DEPARTMENT_OTHER)
Admission: RE | Admit: 2018-04-12 | Discharge: 2018-04-12 | Disposition: A | Discharge status: Home / Self Care | Payer: BLUE CROSS/BLUE SHIELD | Source: Ambulatory Visit | Attending: Family Medicine | Admitting: Family Medicine

## 2018-04-12 DIAGNOSIS — M47814 Spondylosis without myelopathy or radiculopathy, thoracic region: Secondary | ICD-10-CM | POA: Diagnosis not present

## 2018-04-12 DIAGNOSIS — M4854XA Collapsed vertebra, not elsewhere classified, thoracic region, initial encounter for fracture: Secondary | ICD-10-CM | POA: Insufficient documentation

## 2018-04-12 DIAGNOSIS — M546 Pain in thoracic spine: Secondary | ICD-10-CM | POA: Diagnosis not present

## 2018-04-12 DIAGNOSIS — M5124 Other intervertebral disc displacement, thoracic region: Secondary | ICD-10-CM | POA: Diagnosis not present

## 2018-04-14 ENCOUNTER — Encounter: Payer: Self-pay | Admitting: Gastroenterology

## 2018-04-14 ENCOUNTER — Telehealth: Payer: Self-pay | Admitting: Family Medicine

## 2018-04-14 NOTE — Telephone Encounter (Signed)
Paperwork sent in to extend short term disability

## 2018-04-14 NOTE — Telephone Encounter (Signed)
I think he should get a couple weeks of PT in before returning.  I plan to contact him with his MRI results today as well.

## 2018-04-14 NOTE — Telephone Encounter (Signed)
Patient requesting extension on his short term disability. Today ends his short term disability coverage and [employer] informed patient that provider has to reach out to them to extend coverage.   Patient is requesting to extension until June 4th. He starts physical therapy this Tuesday, 5/21

## 2018-04-15 ENCOUNTER — Ambulatory Visit: Payer: BLUE CROSS/BLUE SHIELD | Admitting: Physical Therapy

## 2018-04-15 DIAGNOSIS — M546 Pain in thoracic spine: Secondary | ICD-10-CM

## 2018-04-15 DIAGNOSIS — M6281 Muscle weakness (generalized): Secondary | ICD-10-CM

## 2018-04-15 NOTE — Therapy (Signed)
[facility] [place] [address] [place], [place], [address] Phone: [phone]   Fax:  [phone]  Physical Therapy Evaluation  Patient Details  Name: Paul Gilmore MRN: [id number] Date of Birth: 1976/11/07 Referring Provider: Norton Blizzard, MD   Encounter Date: 04/15/2018  PT End of Session - 04/15/18 1212    Visit Number  1    Number of Visits  12    Authorization Type  Pt has a high copay and may need to drop his frequency to 1x/week    PT Start Time  1117    PT Stop Time  1208    PT Time Calculation (min)  51 min    Activity Tolerance  Patient tolerated treatment well    Behavior During Therapy  [facility] for tasks assessed/performed       Past Medical History:  Diagnosis Date  . Bowel obstruction (HCC)     No past surgical history on file.  There were no vitals filed for this visit.   Subjective Assessment - 04/15/18 1124    Subjective  Pt works for [employer] and arriving to therapy complaining of R lower trap pain and pain along medial scapular border.     Pertinent History  broke back about 1 year ago    Limitations  Walking    How long can you sit comfortably?  5 minutes    How long can you stand comfortably?  5-10 minutes    How long can you walk comfortably?  it hurts all the time, but I have to tolerate it    Diagnostic tests  MRI     Currently in Pain?  Yes    Pain Score  6     Pain Location  Back    Pain Orientation  Right    Pain Descriptors / Indicators  Aching    Pain Type  Acute pain    Pain Onset  More than a month ago    Pain Frequency  Constant    Aggravating Factors   lifting, standing, working    Pain Relieving Factors  heat    Multiple Pain Sites  No         [facility] PT Assessment - 04/15/18 0001      Assessment   Medical Diagnosis  R upper back pain    Referring Provider  Norton Blizzard, MD    Onset Date/Surgical Date  -- 6 months    Hand Dominance  Right    Prior Therapy  no       Precautions   Precautions  None      Balance Screen   Has the patient fallen in the past 6 months  No    Is the patient reluctant to leave their home because of a fear of falling?   No      Home Public house manager residence      Prior Function   Level of Independence  Independent    Vocation  Full time employment    Vocation Requirements  currently on short term disability    Leisure  hunt, fishing, trowing balls with my son who is 23 and plays baseball      Cognition   Overall Cognitive Status  Within Functional Limits for tasks assessed      Observation/Other Assessments   Focus on Therapeutic Outcomes (FOTO)   64% limited      Posture/Postural Control   Posture/Postural Control  Postural limitations  Postural Limitations  Rounded Shoulders;Forward head;Decreased lumbar lordosis      ROM / Strength   AROM / PROM / Strength  AROM;Strength      AROM   Overall AROM   Deficits    Overall AROM Comments  pain with R shoulder ER and IR    AROM Assessment Site  Shoulder    Right/Left Shoulder  Right    Right Shoulder Extension  40 Degrees    Right Shoulder Flexion  180 Degrees    Right Shoulder ABduction  170 Degrees    Right Shoulder Internal Rotation  -- T 12 with pain reported    Right Shoulder External Rotation  70 Degrees      Strength   Overall Strength  Within functional limits for tasks performed    Overall Strength Comments  bialteral shoulder strength 5/5    Strength Assessment Site  Shoulder    Right/Left Shoulder  -- R shoulder grossly 5/5, pain with MMT, ER      Palpation   Palpation comment  tenderness noted Lower Trap and Levator on R side. Pt with active trigger point noted, pt was instructed to use a tennis ball to help self mobilize       Special Tests    Special Tests  Rotator Cuff Impingement      Ambulation/Gait   Gait Comments  decreased arm swing on the R compared to left                Objective measurements  completed on examination: See above findings.      [facility] Adult PT Treatment/Exercise - 04/15/18 0001      Exercises   Exercises  Neck      Neck Exercises: Standing   Other Standing Exercises  Corner stretch holding 10 seconds      Neck Exercises: Seated   Other Seated Exercise  --    Other Seated Exercise  --      Manual Therapy   Manual Therapy  Soft tissue mobilization    Manual therapy comments  Trigger Point release over lower trap/levator on R side    Soft tissue mobilization  biofreeze      Neck Exercises: Stretches   Upper Trapezius Stretch  2 reps;10 seconds    Levator Stretch  2 reps;10 seconds             PT Education - 04/15/18 1211    Education provided  Yes    Education Details  HEP, discussed dry needling for next visit    Person(s) Educated  Patient    Methods  Explanation;Handout;Demonstration    Comprehension  Verbalized understanding;Returned demonstration       PT Short Term Goals - 04/15/18 1235      PT SHORT TERM GOAL #1   Title  Pt will be indepedent in his initial HEP.     Time  2    Period  Weeks    Status  New    Target Date  04/29/18        PT Long Term Goals - 04/15/18 1235      PT LONG TERM GOAL #1   Title  Pt will improve his FOTO from 64% limitation to </= 50% limitation.     Time  6    Period  Weeks    Status  New    Target Date  05/27/18      PT LONG TERM GOAL #2   Title  Pt will be  able to return to work with pain </= 3/10.     Time  6    Period  Weeks    Status  New    Target Date  05/27/18             Plan - 04/15/18 1240    Clinical Impression Statement  Pt arriving to therapy reporting R lower trap/levator pain with larger active trigger point noted. Pt with forward head and shoulder and decreased lumbar lordosis. 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 Pt reporting pain of 6/10 at rest and reports diffictuly sleeping at  night. Pt with normal ROM but increased pain with MMT. pt was issued a HEP and we discussed DN for next visit.     Clinical Presentation  Stable    Clinical Decision Making  Low    Rehab Potential  Excellent    PT Frequency  2x / week    PT Duration  6 weeks    PT Treatment/Interventions  Electrical Stimulation;Cryotherapy;Ultrasound;Moist Heat;Iontophoresis /ml Dexamethasone;Gait training;Stair training;Functional mobility training;Therapeutic activities;Therapeutic exercise;Patient/family education;Manual techniques;Passive range of motion;Dry needling;Taping    PT Next Visit Plan  TPDN, Postural exercises, modalities as needed, STW    PT Home Exercise Plan  Upper trap stretch, levator stretch, corner stretch    Consulted and Agree with Plan of Care  Patient       Patient will benefit from skilled therapeutic intervention in order to improve the following deficits and impairments:  Pain, Postural dysfunction, Decreased activity tolerance, Difficulty walking, Impaired UE functional use  Visit Diagnosis: Pain in thoracic spine  Muscle weakness (generalized)     Problem List Patient Active Problem List   Diagnosis Date Noted  . Thoracic back pain 04/01/2018  . Diverticulitis of intestine with abscess 03/22/2018  . Obesity, Class III, BMI 40-49.9 (morbid obesity) (HCC) 03/08/2018  . Perforation of sigmoid colon due to diverticulitis 03/03/2018  . Hyperglycemia 03/03/2018  . Right foot pain 06/14/2017  . Great toe pain, left 03/04/2017  . Lower back injury 09/24/2016  . Hand laceration 09/24/2016  . Neck pain 02/08/2014    Sharmon Leyden, MPT 04/15/2018, 12:47 PM  [facility] [address] [place], [place], [address] Phone: [phone]   Fax:  [phone]  Name: Paul Gilmore MRN: [id number] Date of Birth: May 15, 1976

## 2018-04-22 ENCOUNTER — Ambulatory Visit: Payer: BLUE CROSS/BLUE SHIELD | Admitting: Physical Therapy

## 2018-04-22 ENCOUNTER — Encounter: Payer: Self-pay | Admitting: Physical Therapy

## 2018-04-22 DIAGNOSIS — M6281 Muscle weakness (generalized): Secondary | ICD-10-CM

## 2018-04-22 DIAGNOSIS — M546 Pain in thoracic spine: Secondary | ICD-10-CM | POA: Diagnosis not present

## 2018-04-22 NOTE — Patient Instructions (Addendum)
Trigger Point Dry Needling  . What is Trigger Point Dry Needling (DN)? o DN is a physical therapy technique used to treat muscle pain and dysfunction. Specifically, DN helps deactivate muscle trigger points (muscle knots).  o A thin filiform needle is used to penetrate the skin and stimulate the underlying trigger point. The goal is for a local twitch response (LTR) to occur and for the trigger point to relax. No medication of any kind is injected during the procedure.   . What Does Trigger Point Dry Needling Feel Like?  o The procedure feels different for each individual patient. Some patients report that they do not actually feel the needle enter the skin and overall the process is not painful. Very mild bleeding may occur. However, many patients feel a deep cramping in the muscle in which the needle was inserted. This is the local twitch response.   Marland Kitchen How Will I feel after the treatment? o Soreness is normal, and the onset of soreness may not occur for a few hours. Typically this soreness does not last longer than two days.  o Bruising is uncommon, however; ice can be used to decrease any possible bruising.  o In rare cases feeling tired or nauseous after the treatment is normal. In addition, your symptoms may get worse before they get better, this period will typically not last longer than 24 hours.   . What Can I do After My Treatment? o Increase your hydration by drinking more water for the next 24 hours. o You may place ice or heat on the areas treated that have become sore, however, do not use heat on inflamed or bruised areas. Heat often brings more relief post needling. o You can continue your regular activities, but vigorous activity is not recommended initially after the treatment for 24 hours. o DN is best combined with other physical therapy such as strengthening, stretching, and other therapies.    TENS stands for Transcutaneous Electrical Nerve Stimulation. In other words,  electrical impulses are allowed to pass through the skin in order to excite a nerve.   Purpose and Use of TENS:  TENS is a method used to manage acute and chronic pain without the use of drugs. It has been effective in managing pain associated with surgery, sprains, strains, trauma, rheumatoid arthritis, and neuralgias. It is a non-addictive, low risk, and non-invasive technique used to control pain. It is not, by any means, a curative form of treatment.   How TENS Works:  Most TENS units are a Paramedic unit powered by one 9 volt battery. Attached to the outside of the unit are two lead wires where two pins and/or snaps connect on each wire. All units come with a set of four reusable pads or electrodes. These are placed on the skin surrounding the area involved. By inserting the leads into  the pads, the electricity can pass from the unit making the circuit complete.  As the intensity is turned up slowly, the electrical current enters the body from the electrodes through the skin to the surrounding nerve fibers. This triggers the release of hormones from within the body. These hormones contain pain relievers. By increasing the circulation of these hormones, the person's pain may be lessened. It is also believed that the electrical stimulation itself helps to block the pain messages being sent to the brain, thus also decreasing the body's perception of pain.   Hazards:  TENS units are NOT to be used by patients with PACEMAKERS, DEFIBRILLATORS,  DIABETIC PUMPS, PREGNANT WOMEN, and patients with SEIZURE DISORDERS.  TENS units are NOT to be used over the heart, throat, brain, or spinal cord.  One of the major side effects from the TENS unit may be skin irritation. Some people may develop a rash if they are sensitive to the materials used in the electrodes or the connecting wires.     Avoid overuse due the body getting used to the stem making it not as effective over time.    .TENS UNIT: This  is helpful for muscle pain and spasm.   Search and Purchase a TENS 7000 2nd edition at www.tenspros.com. It should be less than $30.     TENS unit instructions: Do not shower or bathe with the unit on Turn the unit off before removing electrodes or batteries If the electrodes lose stickiness add a drop of water to the electrodes after they are disconnected from the unit and place on plastic sheet. If you continued to have difficulty, call the TENS unit company to purchase more electrodes. Do not apply lotion on the skin area prior to use. Make sure the skin is clean and dry as this will help prolong the life of the electrodes. After use, always check skin for unusual red areas, rash or other skin difficulties. If there are any skin problems, does not apply electrodes to the same area. Never remove the electrodes from the unit by pulling the wires. Do not use the TENS unit or electrodes other than as directed. Do not change electrode placement without consultating your therapist or physician. Keep 2 fingers with between each electrode. Wear time ratio is 2:1, on to off times.    For example on for 30 minutes off for 15 minutes and then on for 30 minutes off for 15 minutes

## 2018-04-22 NOTE — Therapy (Addendum)
[facility] [place], [place], [address] Phone: [phone]   Fax:  [phone]  Physical Therapy Treatment  Patient Details  Name: Paul Gilmore MRN: [id number] Date of Birth: Apr 06, 1976 Referring Provider: Karlton Lemon, MD   Encounter Date: 04/22/2018  PT End of Session - 04/22/18 0813    Visit Number  2    Number of Visits  12    Authorization Type  Pt has a high copay and may need to drop his frequency to 1x/week    PT Start Time  0810    PT Stop Time  0855    PT Time Calculation (min)  45 min    Activity Tolerance  Patient tolerated treatment well       Past Medical History:  Diagnosis Date  . Bowel obstruction ([place])     History reviewed. No pertinent surgical history.  There were no vitals filed for this visit.  Subjective Assessment - 04/22/18 0811    Subjective  Pt reports that he is doing better the massage helped a lot     Currently in Pain?  Yes    Pain Score  5     Pain Location  Thoracic    Pain Orientation  Right    Pain Descriptors / Indicators  Tightness huge knot    Pain Type  Acute pain    Pain Onset  More than a month ago    Pain Frequency  Constant    Aggravating Factors   lifitng and carrying ladders    Pain Relieving Factors  heat                       [facility] Adult PT Treatment/Exercise - 04/22/18 0001      Neck Exercises: Machines for Strengthening   UBE (Upper Arm Bike)  L2 3' BWD only      Neck Exercises: Standing   Other Standing Exercises  20 reps scap retracition green band      Modalities   Modalities  Electrical Stimulation;Moist Heat      Moist Heat Therapy   Number Minutes Moist Heat  15 Minutes    Moist Heat Location  Cervical and Rt upper shoulder      Electrical Stimulation   Electrical Stimulation Location  Rt cervical and upper shoulder     Electrical Stimulation Action  IFC    Electrical Stimulation Parameters  to tolerance    Electrical Stimulation Goals  Pain;Tone      Manual Therapy   Manual Therapy  Soft tissue mobilization    Soft tissue mobilization  STM to Rt upper shouler and neck with TPR to upper trap      Neck Exercises: Stretches   Other Neck Stretches  3 way doorway stretch.        Trigger Point Dry Needling - 04/22/18 0817    Consent Given?  Yes    Education Handout Provided  Yes    Muscles Treated Upper Body  Levator scapulae;Upper trapezius;Longissimus    Upper Trapezius Response  Palpable increased muscle length;Twitch reponse elicited Rt    Levator Scapulae Response  Palpable increased muscle length;Twitch response elicited Rt    Longissimus Response  Palpable increased muscle length;Twitch response elicited I1-W4           PT Education - 04/22/18 0824    Education provided  Yes    Education Details  DN, TENs    Person(s) Educated  Patient  Methods  Explanation;Demonstration;Handout    Comprehension  Returned demonstration;Verbalized understanding       PT Short Term Goals - 04/15/18 1235      PT SHORT TERM GOAL #1   Title  Pt will be indepedent in his initial HEP.     Time  2    Period  Weeks    Status  New    Target Date  04/29/18        PT Long Term Goals - 04/22/18 0825      PT LONG TERM GOAL #1   Title  Pt will improve his FOTO from 64% limitation to </= 50% limitation.     Status  On-going      PT LONG TERM GOAL #2   Title  Pt will be able to return to work with pain </= 3/10.     Status  On-going pain decreasing            Plan - 04/22/18 0840    Clinical Impression Statement  This is Paul Gilmore's second visit, he reports some improvement in his pain after the manual work last week.  Still had some large trigger points in the rt upper trap/levator and tighness into the cervical paraspinals.  No goals met, however made some progress to them.     Rehab Potential  Excellent    PT Frequency  2x / week    PT Duration  6 weeks    PT Treatment/Interventions   Electrical Stimulation;Cryotherapy;Ultrasound;Moist Heat;Iontophoresis '4mg'$ /ml Dexamethasone;Gait training;Stair training;Functional mobility training;Therapeutic activities;Therapeutic exercise;Patient/family education;Manual techniques;Passive range of motion;Dry needling;Taping    PT Next Visit Plan  assess response to DN/manual work .     Consulted and Agree with Plan of Care  Patient       Patient will benefit from skilled therapeutic intervention in order to improve the following deficits and impairments:  Pain, Postural dysfunction, Decreased activity tolerance, Difficulty walking, Impaired UE functional use  Visit Diagnosis: Muscle weakness (generalized)  Pain in thoracic spine     Problem List Patient Active Problem List   Diagnosis Date Noted  . Thoracic back pain 04/01/2018  . Diverticulitis of intestine with abscess 03/22/2018  . Obesity, Class III, BMI 40-49.9 (morbid obesity) ([place]) 03/08/2018  . Perforation of sigmoid colon due to diverticulitis 03/03/2018  . Hyperglycemia 03/03/2018  . Right foot pain 06/14/2017  . Great toe pain, left 03/04/2017  . Lower back injury 09/24/2016  . Hand laceration 09/24/2016  . Neck pain 02/08/2014    Jeral Pinch PT  04/22/2018, 8:42 AM  [facility] [place], [place], [address] Phone: [phone]   Fax:  [phone]  Name: Paul Gilmore MRN: [id number] Date of Birth: 03/05/1976   PHYSICAL THERAPY DISCHARGE SUMMARY  Visits from Start of Care: 2  Current functional level related to goals / functional outcomes: Pt hasn't returned since 5/28. She note above for last functional status. Co-pay was high, unable to continue.  Plan: Patient agrees to discharge.  Patient goals were not met. Patient is being discharged due to financial reasons.  ?????

## 2018-04-25 ENCOUNTER — Encounter: Payer: BLUE CROSS/BLUE SHIELD | Admitting: Rehabilitative and Restorative Service Providers"

## 2018-04-28 ENCOUNTER — Ambulatory Visit (INDEPENDENT_AMBULATORY_CARE_PROVIDER_SITE_OTHER): Payer: BLUE CROSS/BLUE SHIELD | Admitting: Family Medicine

## 2018-04-28 ENCOUNTER — Encounter: Payer: Self-pay | Admitting: Family Medicine

## 2018-04-28 DIAGNOSIS — M546 Pain in thoracic spine: Secondary | ICD-10-CM

## 2018-04-28 MED ORDER — TIZANIDINE HCL 4 MG PO TABS: 4.0000 mg | ORAL_TABLET | Freq: Three times a day (TID) | ORAL | 1 refills | Status: DC | PRN

## 2018-04-28 MED FILL — tiZANidine HCL 4 MG TABS: 4 | 20 days supply | Qty: 60 | Fill #0

## 2018-04-28 NOTE — Patient Instructions (Signed)
Take diclofenac, tylenol, zanaflex as needed. Continue physical therapy and transition to the home exercises - do the home exercises for 6 more weeks beyond when you finish therapy. Work note - can return to full duty. Follow up with me as needed.

## 2018-04-29 ENCOUNTER — Encounter: Payer: Self-pay | Admitting: Family Medicine

## 2018-04-29 NOTE — Assessment & Plan Note (Signed)
MRI reassuring.  Old T12 compression fracture.  Pain 2/2 muscle strains.  Continue PT and transition to home exercises.  Full duty note provided.  Diclofenac, tylenol, zanaflex just as needed.  F/u prn.

## 2018-04-29 NOTE — Progress Notes (Signed)
PCP: Patient, No Pcp Per  Subjective:   HPI: Patient is a 42 y.o. male here for back pain.  2/26: Patient reports on 2/24 he was coming down ramp in front of his house, feet slid out from under him and he landed hard onto left side of back and lateral upper hip. Immediate pain that has persisted at 9/10 level, sharp. Tried heating pad, ibuprofen. Pain worse with valsalva including coughing, sneezing, straining to use bathroom. Difficulty sleeping due to pain. Has history of compression fracture of T12. No radiation into legs. No numbness/tingling. No bowel/bladder dysfunction, only pain in left side back with straining.  4/1: Patient returns with 10/10 level sharp pain now on the right side of his back from thoracic down to lumbar spine areas. No radiation into legs. Left side not as bad as last visit but reports pain has persisted since his last visit. Worse with coughing. Using heating pad and taking voltaren without much help. No bowel/bladder dysfunction. No numbness.  5/6: Patient reports he's been doing well since discharge from hospital most recent time for ruptured diverticulitis. Still on antibiotics, most recent CT showed good improvement per report. However he's dealing with right side thoracic back pain at 9/10 level, sharp. Not taking anything for this now. Sitting in recliner. Worse with breathing but no cough, shortness of breath. No numbness, radiation into arms.  6/3: Patient reports he's doing better. Pain level down to 4/10 level. Did PT for a couple visits though this is expensive. And doing home exercises. Difficulty sleeping at night at times. Is using biofreeze with diclofenac and tylenol. No radiation into arms. No numbness.  Past Medical History:  Diagnosis Date  . Bowel obstruction [facility])     Current Outpatient Medications on File Prior to Visit  Medication Sig Dispense Refill  . acetaminophen (TYLENOL 8 HOUR) 650 MG CR tablet Take 1,300  mg by mouth daily as needed (headache).     Marland Kitchen amLODipine (NORVASC) 5 MG tablet Take 1 tablet (5 mg total) by mouth daily. 30 tablet 0  . diclofenac (VOLTAREN) 75 MG EC tablet Take 1 tablet (75 mg total) by mouth 2 (two) times daily. 60 tablet 1  . oxyCODONE (ROXICODONE) 5 MG immediate release tablet Take 1 tablet (5 mg total) by mouth every 6 (six) hours as needed for severe pain. 30 tablet 0  . saccharomyces boulardii (FLORASTOR) 250 MG capsule You can buy this over the counter at any drug store, it is with the other stool related medicines.  It does not matter which one you use, but I would stay on them until you are off antibiotics completely.  Follow package directions for daily dose.     No current facility-administered medications on file prior to visit.     History reviewed. No pertinent surgical history.  No Known Allergies  Social History   Socioeconomic History  . Marital status: Married    Spouse name: Not on file  . Number of children: Not on file  . Years of education: Not on file  . Highest education level: Not on file  Occupational History  . Not on file  Social Needs  . Financial resource strain: Not on file  . Food insecurity:    Worry: Not on file    Inability: Not on file  . Transportation needs:    Medical: Not on file    Non-medical: Not on file  Tobacco Use  . Smoking status: Never Smoker  . Smokeless tobacco: Former Neurosurgeon  Substance and Sexual Activity  . Alcohol use: No    Alcohol/week: 0.0 oz  . Drug use: Yes    Types: Marijuana    Comment: occasional  . Sexual activity: Yes  Lifestyle  . Physical activity:    Days per week: Not on file    Minutes per session: Not on file  . Stress: Not on file  Relationships  . Social connections:    Talks on phone: Not on file    Gets together: Not on file    Attends religious service: Not on file    Active member of club or organization: Not on file    Attends meetings of clubs or organizations: Not on  file    Relationship status: Not on file  . Intimate partner violence:    Fear of current or ex partner: Not on file    Emotionally abused: Not on file    Physically abused: Not on file    Forced sexual activity: Not on file  Other Topics Concern  . Not on file  Social History Narrative  . Not on file    History reviewed. No pertinent family history.  BP (!) 154/98   Pulse 93   Ht 6' (1.829 m)   Wt 300 lb (136.1 kg)   BMI 40.69 kg/m   Review of Systems: See HPI above.     Objective:  Physical Exam:  Gen: NAD, comfortable in exam room  Back: No gross deformity, scoliosis. No TTP currently. FROM without pain on simulated bench press or scapular squeeze. Strength LEs 5/5 all muscle groups upper extremities.   Sensation intact to light touch bilaterally.   Assessment & Plan:  1. Thoracic back pain - MRI reassuring.  Old T12 compression fracture.  Pain 2/2 muscle strains.  Continue PT and transition to home exercises.  Full duty note provided.  Diclofenac, tylenol, zanaflex just as needed.  F/u prn.

## 2018-06-11 ENCOUNTER — Ambulatory Visit: Payer: BLUE CROSS/BLUE SHIELD | Admitting: Gastroenterology

## 2018-06-11 ENCOUNTER — Encounter: Payer: Self-pay | Admitting: Gastroenterology

## 2018-06-11 VITALS — BP 150/94 | HR 76 | Ht 69.75 in | Wt 290.0 lb

## 2018-06-11 DIAGNOSIS — K5732 Diverticulitis of large intestine without perforation or abscess without bleeding: Secondary | ICD-10-CM | POA: Diagnosis not present

## 2018-06-11 DIAGNOSIS — I10 Essential (primary) hypertension: Secondary | ICD-10-CM

## 2018-06-11 MED ORDER — AMLODIPINE BESYLATE 5 MG PO TABS: 5.0000 mg | ORAL_TABLET | Freq: Every day | ORAL | 1 refills | Status: DC

## 2018-06-11 MED ORDER — SUPREP BOWEL PREP KIT 17.5-3.13-1.6 GM/177ML PO SOLN: ORAL | 0 refills | Status: DC

## 2018-06-11 MED FILL — AMLODIPINE BESYLATE 5 MG TA: 5 | 90 days supply | Qty: 90 | Fill #0

## 2018-06-11 MED FILL — tiZANidine HCL 4 MG TABS: 4 | 20 days supply | Qty: 60 | Fill #1

## 2018-06-11 MED FILL — SUPREP BOWEL PREP KIT: 17.5-3.13-1 | 1 days supply | Qty: 354 | Fill #0

## 2018-06-11 NOTE — Progress Notes (Signed)
HPI :  42 year old male with a history of GERD, hypertension, diverticulitis, referred by Dr. Feliciana RossettiLuke Kinsinger for evaluation of history of diverticulitis.  The patient initially presented to the hospital in April 2019 with left lower quadrant abdominal pain which was severe. At the time e had a CT scan showing sigmoid diverticulitis complicated by perforated diverticulum and a large inflammatory phlegmonous probable abscess in the area. He states he was admitted for roughly 12 days and treated with antibiotics and he slowly improved. About a week after his discharge she was readmitted with nausea and vomiting which this was perhaps certain viral infection.He completed a full course of antibiotics and has been doing really well since that time. He's had no recurrence of abdominal pain/diverticulitis. He denies any abdominal pains much at all. His bowel movements are regular. He does not have any blood in the stools. He is never had a prior colonoscopy. He has no family history of colon cancer. His mother did have diverticulitis.  He does endorse long-standing history of GERD, he reports he previously was on Prilosec for some time, however he's been off all medication for the past 15 years. He states his reflux no longer bothersome much. No dysphagia  History otherwise has a history of hypertension that was noted when he was hospitalized. She states he was placed on Norvasc 5 mg once daily for this. He subsequently ran out of the Norvasc in his blood pressure has been rising. He has not yet been able to follow up with his primary care for hypertension.   Past Medical History:  Diagnosis Date  . Anxiety   . Bowel obstruction (HCC)   . Diverticulitis of large intestine with perforation and abscess 02/2018  . GERD (gastroesophageal reflux disease)   . HTN (hypertension)      Past Surgical History:  Procedure Laterality Date  . NO PAST SURGERIES     Family History  Problem Relation Age of Onset   . COPD Mother   . Diverticulitis Mother   . Alcohol abuse Father   . Hypertension Father   . Throat cancer Father    Social History   Tobacco Use  . Smoking status: Never Smoker  . Smokeless tobacco: Former NeurosurgeonUser    Types: Chew  Substance Use Topics  . Alcohol use: Yes    Alcohol/week: 0.0 oz    Comment: moderate  . Drug use: Yes    Types: Marijuana    Comment: occasional   Current Outpatient Medications  Medication Sig Dispense Refill  . acetaminophen (TYLENOL 8 HOUR) 650 MG CR tablet Take 1,300 mg by mouth daily as needed (headache).      No current facility-administered medications for this visit.    No Known Allergies   Review of Systems: All systems reviewed and negative except where noted in HPI.   Lab Results  Component Value Date   WBC 8.9 03/24/2018   HGB 14.1 03/24/2018   HCT 43.2 03/24/2018   MCV 91.3 03/24/2018   PLT 396 03/24/2018    Lab Results  Component Value Date   CREATININE 1.07 03/22/2018   BUN 19 03/22/2018   NA 137 03/22/2018   K 4.0 03/22/2018   CL 100 (L) 03/22/2018   CO2 20 (L) 03/22/2018    Lab Results  Component Value Date   ALT 43 03/22/2018   AST 28 03/22/2018   ALKPHOS 90 03/22/2018   BILITOT 1.2 03/22/2018     Physical Exam: BP (!) 150/94 (BP Location: Left  Arm, Patient Position: Sitting, Cuff Size: Normal)   Pulse 76   Ht 5' 9.75" (1.772 m) Comment: height measured without shoes  Wt 290 lb (131.5 kg)   BMI 41.91 kg/m  Constitutional: Pleasant,well-developed, male in no acute distress. HEENT: Normocephalic and atraumatic. Conjunctivae are normal. No scleral icterus. Neck supple.  Cardiovascular: Normal rate, regular rhythm.  Pulmonary/chest: Effort normal and breath sounds normal. No wheezing, rales or rhonchi. Abdominal: Soft, nondistended, nontender.  There are no masses palpable. No hepatomegaly. Extremities: no edema Lymphadenopathy: No cervical adenopathy noted. Neurological: Alert and oriented to person  place and time. Skin: Skin is warm and dry. No rashes noted. Psychiatric: Normal mood and affect. Behavior is normal.   ASSESSMENT AND PLAN: 42 year old male here for new patient assessment of the following issues:  Diverticulitis - one episode of complicated diverticulitis associated with microperforation and abscess formation. He was treated conservatively with antibiotics and a prolonged hospital stay, but was able to avoid surgery. He since been doing really well. We discussed what diverticulosis / diverticulitis is. He is at risk for recurrent diverticulitis given this episode. Given he has not had any prior colonoscopy, I'm recommending optical colonoscopy to ensure no polyps or mass lesions in the area noted on CT scan, and confirm the presence of diverticulosis. Following discussion of the risks and benefits of colonoscopy wanted to proceed. Further recommendations pending the results. Recommend a daily fiber supplement moving forward to minimize the risk of worsening diverticulosis. He wants to avoid surgical therapy if possible, as long as he does not have recurrence we'll continue to monitor.  HTN - He ran out of his antihypertensives, we'll refill his Norvasc 5 mg once daily, he should follow up with his primary care for long-term management of hypertension.  Ileene Patrick, MD [facility]   CC: Dr. Feliciana Rossetti

## 2018-06-11 NOTE — Patient Instructions (Addendum)
If you are age 43 or older, your body mass index should be between 23-30. Your Body mass index is 41.91 kg/m. If this is out of the aforementioned range listed, please consider follow up with your Primary Care Provider.  If you are age 43 or younger, your body mass index should be between 19-25. Your Body mass index is 41.91 kg/m. If this is out of the aformentioned range listed, please consider follow up with your Primary Care Provider.   You have been scheduled for a colonoscopy. Please follow written instructions given to you at your visit today.  Please pick up your prep supplies at the pharmacy within the next 1-3 days. If you use inhalers (even only as needed), please bring them with you on the day of your procedure. Your physician has requested that you go to www.startemmi.com and enter the access code given to you at your visit today. This web site gives a general overview about your procedure. However, you should still follow specific instructions given to you by our office regarding your preparation for the procedure.  We have sent the following medications to your pharmacy for you to pick up at your convenience: Norvasc 5mg : Take once daily  Thank you for entrusting me with your care and for [facility], Dr. Ileene PatrickSteven Armbruster

## 2018-08-01 ENCOUNTER — Encounter: Payer: Self-pay | Admitting: Gastroenterology

## 2018-08-04 ENCOUNTER — Encounter: Payer: BLUE CROSS/BLUE SHIELD | Admitting: Gastroenterology

## 2018-08-15 ENCOUNTER — Ambulatory Visit (AMBULATORY_SURGERY_CENTER): Payer: BLUE CROSS/BLUE SHIELD | Admitting: Gastroenterology

## 2018-08-15 ENCOUNTER — Encounter: Payer: Self-pay | Admitting: Gastroenterology

## 2018-08-15 VITALS — BP 129/88 | HR 82 | Temp 98.9°F | Resp 15 | Ht 69.75 in | Wt 290.0 lb

## 2018-08-15 DIAGNOSIS — K5732 Diverticulitis of large intestine without perforation or abscess without bleeding: Secondary | ICD-10-CM

## 2018-08-15 DIAGNOSIS — K648 Other hemorrhoids: Secondary | ICD-10-CM

## 2018-08-15 MED ORDER — SODIUM CHLORIDE 0.9 % IV SOLN: 500.0000 mL | Freq: Once | INTRAVENOUS | Status: DC

## 2018-08-15 NOTE — Op Note (Signed)
[facility] Patient Name: Paul Gilmore Procedure Date: 08/15/2018 1:18 PM MRN: [id number] Endoscopist: Viviann Spare P. Adela Lank , MD Age: 42 Referring MD:  Date of Birth: 03-07-1976 Gender: Male Account #: [id number] Procedure:                Colonoscopy Indications:              Follow-up of complicated sigmoid diverticulitis                            diagnosed April 2019 Medicines:                Monitored Anesthesia Care Procedure:                Pre-Anesthesia Assessment:                           - Prior to the procedure, a History and Physical                            was performed, and patient medications and                            allergies were reviewed. The patient's tolerance of                            previous anesthesia was also reviewed. The risks                            and benefits of the procedure and the sedation                            options and risks were discussed with the patient.                            All questions were answered, and informed consent                            was obtained. Prior Anticoagulants: The patient has                            taken no previous anticoagulant or antiplatelet                            agents. ASA Grade Assessment: III - A patient with                            severe systemic disease. After reviewing the risks                            and benefits, the patient was deemed in                            satisfactory condition to undergo the procedure.  After obtaining informed consent, the colonoscope                            was passed under direct vision. Throughout the                            procedure, the patient's blood pressure, pulse, and                            oxygen saturations were monitored continuously. The                            Colonoscope was introduced through the anus and                            advanced to the the cecum, identified  by                            appendiceal orifice and ileocecal valve. The                            colonoscopy was performed without difficulty. The                            patient tolerated the procedure well. The quality                            of the bowel preparation was good. The ileocecal                            valve, appendiceal orifice, and rectum were                            photographed. Scope In: 1:23:41 PM Scope Out: 1:39:15 PM Scope Withdrawal Time: 0 hours 12 minutes 26 seconds  Total Procedure Duration: 0 hours 15 minutes 34 seconds  Findings:                 The perianal and digital rectal examinations were                            normal.                           A few small-mouthed diverticula were found in the                            ascending colon.                           Severe diverticulosis was found in the sigmoid                            colon. There were numerous diverticuli with  associated edema, erythema, and luminal narrowing.                            No obvious polyps or mass lesions although                            visualization was difficult behind several                            edematous folds in narrowed areas.                           Internal hemorrhoids were found during retroflexion.                           The exam was otherwise without abnormality. Complications:            No immediate complications. Estimated blood loss:                            None. Estimated Blood Loss:     Estimated blood loss: none. Impression:               - Diverticulosis in the ascending colon.                           - Severe diverticulosis in the sigmoid colon with                            associated erythema, edema, and luminal narrowing.                           - Internal hemorrhoids.                           - The examination was otherwise normal.                           Prior CT scan  findings correlate with exam findings                            on colonoscopy today. Recommendation:           - Patient has a contact number available for                            emergencies. The signs and symptoms of potential                            delayed complications were discussed with the                            patient. Return to normal activities tomorrow.                            Written discharge instructions were provided to the  patient.                           - Resume previous diet.                           - Continue present medications.                           - Repeat colonoscopy in 10 years for screening                            purposes.                           - Contact us with any recurrence of symptoms                            concerning for diverticulitis Viviann Spare P. Claire Dolores, MD 08/15/2018 1:46:42 PM This report has been signed electronically.

## 2018-08-15 NOTE — Patient Instructions (Signed)
Handout given on diverticulosis and hemorrhoids.   YOU HAD AN ENDOSCOPIC PROCEDURE TODAY AT THE [facility]:   Refer to the procedure report that was given to you for any specific questions about what was found during the examination.  If the procedure report does not answer your questions, please call your gastroenterologist to clarify.  If you requested that your care partner not be given the details of your procedure findings, then the procedure report has been included in a sealed envelope for you to review at your convenience later.  YOU SHOULD EXPECT: Some feelings of bloating in the abdomen. Passage of more gas than usual.  Walking can help get rid of the air that was put into your GI tract during the procedure and reduce the bloating. If you had a lower endoscopy (such as a colonoscopy or flexible sigmoidoscopy) you may notice spotting of blood in your stool or on the toilet paper. If you underwent a bowel prep for your procedure, you may not have a normal bowel movement for a few days.  Please Note:  You might notice some irritation and congestion in your nose or some drainage.  This is from the oxygen used during your procedure.  There is no need for concern and it should clear up in a day or so.  SYMPTOMS TO REPORT IMMEDIATELY:   Following lower endoscopy (colonoscopy or flexible sigmoidoscopy):  Excessive amounts of blood in the stool  Significant tenderness or worsening of abdominal pains  Swelling of the abdomen that is new, acute  Fever of 100F or higher   For urgent or emergent issues, a gastroenterologist can be reached at any hour by calling [phone].   DIET:  We do recommend a small meal at first, but then you may proceed to your regular diet.  Drink plenty of fluids but you should avoid alcoholic beverages for 24 hours.  ACTIVITY:  You should plan to take it easy for the rest of today and you should NOT DRIVE or use heavy machinery until tomorrow  (because of the sedation medicines used during the test).    FOLLOW UP: Our staff will call the number listed on your records the next business day following your procedure to check on you and address any questions or concerns that you may have regarding the information given to you following your procedure. If we do not reach you, we will leave a message.  However, if you are feeling well and you are not experiencing any problems, there is no need to return our call.  We will assume that you have returned to your regular daily activities without incident.  If any biopsies were taken you will be contacted by phone or by letter within the next 1-3 weeks.  Please call us at [phone] if you have not heard about the biopsies in 3 weeks.    SIGNATURES/CONFIDENTIALITY: You and/or your care partner have signed paperwork which will be entered into your electronic medical record.  These signatures attest to the fact that that the information above on your After Visit Summary has been reviewed and is understood.  Full responsibility of the confidentiality of this discharge information lies with you and/or your care-partner.

## 2018-08-18 ENCOUNTER — Telehealth: Payer: Self-pay | Admitting: *Deleted

## 2018-08-18 NOTE — Telephone Encounter (Signed)
  Follow up Call-  called [phone] No flowsheet data found.   Patient questions:  Do you have a fever, pain , or abdominal swelling? No. Pain Score  0 *  Have you tolerated food without any problems? Yes.    Have you been able to return to your normal activities? Yes.    Do you have any questions about your discharge instructions: Diet   No. Medications  No. Follow up visit  No.  Do you have questions or concerns about your Care? No.  Actions: * If pain score is 4 or above: No action needed, pain <4.

## 2018-10-09 ENCOUNTER — Inpatient Hospital Stay (HOSPITAL_COMMUNITY)
Admission: EM | Admit: 2018-10-09 | Discharge: 2018-10-11 | DRG: 392 | Disposition: A | Discharge status: Home / Self Care | Payer: BLUE CROSS/BLUE SHIELD | Attending: General Surgery | Admitting: General Surgery

## 2018-10-09 ENCOUNTER — Emergency Department (HOSPITAL_COMMUNITY): Payer: BLUE CROSS/BLUE SHIELD

## 2018-10-09 ENCOUNTER — Other Ambulatory Visit: Payer: Self-pay

## 2018-10-09 ENCOUNTER — Encounter (HOSPITAL_COMMUNITY): Payer: Self-pay | Admitting: Emergency Medicine

## 2018-10-09 DIAGNOSIS — Z811 Family history of alcohol abuse and dependence: Secondary | ICD-10-CM | POA: Diagnosis not present

## 2018-10-09 DIAGNOSIS — Z6838 Body mass index (BMI) 38.0-38.9, adult: Secondary | ICD-10-CM

## 2018-10-09 DIAGNOSIS — K5732 Diverticulitis of large intestine without perforation or abscess without bleeding: Secondary | ICD-10-CM | POA: Diagnosis not present

## 2018-10-09 DIAGNOSIS — Z87891 Personal history of nicotine dependence: Secondary | ICD-10-CM | POA: Diagnosis not present

## 2018-10-09 DIAGNOSIS — K5792 Diverticulitis of intestine, part unspecified, without perforation or abscess without bleeding: Secondary | ICD-10-CM | POA: Diagnosis present

## 2018-10-09 DIAGNOSIS — Z8249 Family history of ischemic heart disease and other diseases of the circulatory system: Secondary | ICD-10-CM

## 2018-10-09 DIAGNOSIS — Z825 Family history of asthma and other chronic lower respiratory diseases: Secondary | ICD-10-CM

## 2018-10-09 DIAGNOSIS — F1290 Cannabis use, unspecified, uncomplicated: Secondary | ICD-10-CM | POA: Diagnosis present

## 2018-10-09 DIAGNOSIS — F419 Anxiety disorder, unspecified: Secondary | ICD-10-CM | POA: Diagnosis present

## 2018-10-09 DIAGNOSIS — Z79899 Other long term (current) drug therapy: Secondary | ICD-10-CM | POA: Diagnosis not present

## 2018-10-09 DIAGNOSIS — K5720 Diverticulitis of large intestine with perforation and abscess without bleeding: Secondary | ICD-10-CM | POA: Diagnosis not present

## 2018-10-09 DIAGNOSIS — K760 Fatty (change of) liver, not elsewhere classified: Secondary | ICD-10-CM | POA: Diagnosis not present

## 2018-10-09 DIAGNOSIS — J302 Other seasonal allergic rhinitis: Secondary | ICD-10-CM | POA: Diagnosis present

## 2018-10-09 DIAGNOSIS — E669 Obesity, unspecified: Secondary | ICD-10-CM | POA: Diagnosis not present

## 2018-10-09 DIAGNOSIS — K219 Gastro-esophageal reflux disease without esophagitis: Secondary | ICD-10-CM | POA: Diagnosis not present

## 2018-10-09 DIAGNOSIS — I10 Essential (primary) hypertension: Secondary | ICD-10-CM | POA: Diagnosis not present

## 2018-10-09 LAB — CBC
HEMATOCRIT: 48.1 % (ref 39.0–52.0)
Hemoglobin: 16.1 g/dL (ref 13.0–17.0)
MCH: 30.6 pg (ref 26.0–34.0)
MCHC: 33.5 g/dL (ref 30.0–36.0)
MCV: 91.4 fL (ref 80.0–100.0)
Platelets: 328 10*3/uL (ref 150–400)
RBC: 5.26 MIL/uL (ref 4.22–5.81)
RDW: 12.3 % (ref 11.5–15.5)
WBC: 12.3 10*3/uL — ABNORMAL HIGH (ref 4.0–10.5)
nRBC: 0 % (ref 0.0–0.2)

## 2018-10-09 LAB — URINALYSIS, ROUTINE W REFLEX MICROSCOPIC
BACTERIA UA: NONE SEEN
BILIRUBIN URINE: NEGATIVE
Glucose, UA: NEGATIVE mg/dL
KETONES UR: NEGATIVE mg/dL
Leukocytes, UA: NEGATIVE
NITRITE: NEGATIVE
Protein, ur: 30 mg/dL — AB
SPECIFIC GRAVITY, URINE: 1.026 (ref 1.005–1.030)
pH: 5 (ref 5.0–8.0)

## 2018-10-09 LAB — COMPREHENSIVE METABOLIC PANEL
ALBUMIN: 4 g/dL (ref 3.5–5.0)
ALT: 24 U/L (ref 0–44)
AST: 18 U/L (ref 15–41)
Alkaline Phosphatase: 75 U/L (ref 38–126)
Anion gap: 11 (ref 5–15)
BUN: 17 mg/dL (ref 6–20)
CO2: 24 mmol/L (ref 22–32)
CREATININE: 0.99 mg/dL (ref 0.61–1.24)
Calcium: 9.2 mg/dL (ref 8.9–10.3)
Chloride: 103 mmol/L (ref 98–111)
GFR calc Af Amer: >60 mL/min (ref >60)
GFR calc non Af Amer: >60 mL/min (ref >60)
GLUCOSE: 112 mg/dL — AB (ref 70–99)
Potassium: 3.9 mmol/L (ref 3.5–5.1)
SODIUM: 138 mmol/L (ref 135–145)
Total Bilirubin: 0.7 mg/dL (ref 0.3–1.2)
Total Protein: 7.6 g/dL (ref 6.5–8.1)

## 2018-10-09 LAB — LIPASE, BLOOD: Lipase: 22 U/L (ref 11–51)

## 2018-10-09 MED ORDER — SODIUM CHLORIDE 0.9 % IV SOLN
INTRAVENOUS | Status: DC | PRN
  Administered 2018-10-09: 250 mL via INTRAVENOUS

## 2018-10-09 MED ORDER — KCL IN DEXTROSE-NACL 20-5-0.45 MEQ/L-%-% IV SOLN
INTRAVENOUS | Status: DC
  Administered 2018-10-09 – 2018-10-10 (×2): via INTRAVENOUS
  Filled 2018-10-09 (×2): qty 1000

## 2018-10-09 MED ORDER — ONDANSETRON HCL 4 MG/2ML IJ SOLN
4.0000 mg | Freq: Once | INTRAMUSCULAR | Status: AC
  Administered 2018-10-09: 4 mg via INTRAVENOUS
  Filled 2018-10-09: qty 2

## 2018-10-09 MED ORDER — DOCUSATE SODIUM 100 MG PO CAPS
100.0000 mg | ORAL_CAPSULE | Freq: Two times a day (BID) | ORAL | Status: DC
  Administered 2018-10-10 – 2018-10-11 (×3): 100 mg via ORAL
  Filled 2018-10-09 (×4): qty 1

## 2018-10-09 MED ORDER — MORPHINE SULFATE (PF) 2 MG/ML IV SOLN
2.0000 mg | INTRAVENOUS | Status: DC | PRN
  Administered 2018-10-09: 2 mg via INTRAVENOUS
  Administered 2018-10-10 (×3): 4 mg via INTRAVENOUS
  Filled 2018-10-09: qty 1
  Filled 2018-10-09 (×3): qty 2

## 2018-10-09 MED ORDER — AMLODIPINE BESYLATE 5 MG PO TABS
5.0000 mg | ORAL_TABLET | Freq: Every day | ORAL | Status: DC
  Administered 2018-10-10 – 2018-10-11 (×2): 5 mg via ORAL
  Filled 2018-10-09 (×2): qty 1

## 2018-10-09 MED ORDER — SODIUM CHLORIDE 0.9 % IV SOLN
2.0000 g | Freq: Every day | INTRAVENOUS | Status: DC
  Administered 2018-10-09 – 2018-10-10 (×2): 2 g via INTRAVENOUS
  Filled 2018-10-09 (×2): qty 2

## 2018-10-09 MED ORDER — ACETAMINOPHEN 500 MG PO TABS: 1000.0000 mg | ORAL_TABLET | Freq: Four times a day (QID) | ORAL | Status: DC | PRN

## 2018-10-09 MED ORDER — IOPAMIDOL (ISOVUE-300) INJECTION 61%
INTRAVENOUS | Status: AC
  Filled 2018-10-09: qty 100

## 2018-10-09 MED ORDER — DIPHENHYDRAMINE HCL 50 MG/ML IJ SOLN: 25.0000 mg | Freq: Four times a day (QID) | INTRAMUSCULAR | Status: DC | PRN

## 2018-10-09 MED ORDER — SIMETHICONE 80 MG PO CHEW: 40.0000 mg | CHEWABLE_TABLET | Freq: Four times a day (QID) | ORAL | Status: DC | PRN

## 2018-10-09 MED ORDER — HYDROMORPHONE HCL 1 MG/ML IJ SOLN
1.0000 mg | Freq: Once | INTRAMUSCULAR | Status: AC
  Administered 2018-10-09: 1 mg via INTRAVENOUS
  Filled 2018-10-09: qty 1

## 2018-10-09 MED ORDER — ONDANSETRON HCL 4 MG/2ML IJ SOLN: 4.0000 mg | Freq: Four times a day (QID) | INTRAMUSCULAR | Status: DC | PRN

## 2018-10-09 MED ORDER — SODIUM CHLORIDE 0.9 % IV BOLUS
1000.0000 mL | Freq: Once | INTRAVENOUS | Status: AC
  Administered 2018-10-09: 1000 mL via INTRAVENOUS

## 2018-10-09 MED ORDER — ONDANSETRON 4 MG PO TBDP: 4.0000 mg | ORAL_TABLET | Freq: Four times a day (QID) | ORAL | Status: DC | PRN

## 2018-10-09 MED ORDER — DIPHENHYDRAMINE HCL 25 MG PO CAPS: 25.0000 mg | ORAL_CAPSULE | Freq: Four times a day (QID) | ORAL | Status: DC | PRN

## 2018-10-09 MED ORDER — IOPAMIDOL (ISOVUE-300) INJECTION 61%
100.0000 mL | Freq: Once | INTRAVENOUS | Status: AC | PRN
  Administered 2018-10-09: 100 mL via INTRAVENOUS

## 2018-10-09 MED ORDER — METRONIDAZOLE IN NACL 5-0.79 MG/ML-% IV SOLN
500.0000 mg | Freq: Three times a day (TID) | INTRAVENOUS | Status: DC
  Administered 2018-10-09 – 2018-10-11 (×5): 500 mg via INTRAVENOUS
  Filled 2018-10-09 (×5): qty 100

## 2018-10-09 MED ORDER — SODIUM CHLORIDE (PF) 0.9 % IJ SOLN
INTRAMUSCULAR | Status: AC
  Filled 2018-10-09: qty 50

## 2018-10-09 MED ORDER — ENOXAPARIN SODIUM 40 MG/0.4ML ~~LOC~~ SOLN
40.0000 mg | Freq: Every day | SUBCUTANEOUS | Status: DC
  Administered 2018-10-09 – 2018-10-10 (×2): 40 mg via SUBCUTANEOUS
  Filled 2018-10-09 (×2): qty 0.4

## 2018-10-09 NOTE — ED Notes (Signed)
ED TO INPATIENT HANDOFF REPORT  Name/Age/Gender Paul Gilmore 42 y.o. male  Code Status Code Status History    Date Active Date Inactive Code Status Order ID Comments User Context   03/22/2018 1846 03/24/2018 1738 Full Code [id number]  Excell Seltzer, MD Inpatient   03/03/2018 1247 03/15/2018 1447 Full Code [id number]  [facility]      Home/SNF/Other Home  Chief Complaint stomach pain, nausea  Level of Care/Admitting Diagnosis ED Disposition    ED Disposition Condition [place] [facility] Area: [facility] [[id number]]  Level of Care: Med-Surg [16]  Diagnosis: Diverticulitis [923300]  Admitting Physician: CCS, [place]  Attending Physician: CCS, MD [[id number]]  Estimated length of stay: 3 - 4 days  Certification:: I certify this patient will need inpatient services for at least 2 midnights  PT Class (Do Not Modify): Inpatient [101]  PT Acc Code (Do Not Modify): Private [1]       Medical History Past Medical History:  Diagnosis Date  . Allergy   . Anxiety   . Bowel obstruction ([place])   . Diverticulitis of large intestine with perforation and abscess 02/2018  . GERD (gastroesophageal reflux disease)   . HTN (hypertension)     Allergies No Known Allergies  IV Location/Drains/Wounds Patient Lines/Drains/Airways Status   Active Line/Drains/Airways    Name:   Placement date:   Placement time:   Site:   Days:   Peripheral IV 10/09/18 Right Antecubital   10/09/18    1658    Antecubital   less than 1          Labs/Imaging Results for orders placed or performed during the hospital encounter of 10/09/18 (from the past 48 hour(s))  Lipase, blood     Status: None   Collection Time: 10/09/18  2:37 PM  Result Value Ref Range   Lipase 22 11 - 51 U/L    Comment: Performed at [facility], [place] [address], [place], [place] [address]  Comprehensive metabolic panel     Status: Abnormal   Collection Time: 10/09/18  2:37 PM   Result Value Ref Range   Sodium 138 135 - 145 mmol/L   Potassium 3.9 3.5 - 5.1 mmol/L   Chloride 103 98 - 111 mmol/L   CO2 24 22 - 32 mmol/L   Glucose, Bld 112 (H) 70 - 99 mg/dL   BUN 17 6 - 20 mg/dL   Creatinine, Ser 0.99 0.61 - 1.24 mg/dL   Calcium 9.2 8.9 - 10.3 mg/dL   Total Protein 7.6 6.5 - 8.1 g/dL   Albumin 4.0 3.5 - 5.0 g/dL   AST 18 15 - 41 U/L   ALT 24 0 - 44 U/L   Alkaline Phosphatase 75 38 - 126 U/L   Total Bilirubin 0.7 0.3 - 1.2 mg/dL   GFR calc non Af Amer >60 >60 mL/min   GFR calc Af Amer >60 >60 mL/min    Comment: (NOTE) The eGFR has been calculated using the CKD EPI equation. This calculation has not been validated in all clinical situations. eGFR's persistently <60 mL/min signify possible Chronic Kidney Disease.    Anion gap 11 5 - 15    Comment: Performed at [facility], [place] [address] [place], [place] [address]  CBC     Status: Abnormal   Collection Time: 10/09/18  2:37 PM  Result Value Ref Range   WBC 12.3 (H) 4.0 - 10.5 K/uL   RBC 5.26  4.22 - 5.81 MIL/uL   Hemoglobin 16.1 13.0 - 17.0 g/dL   HCT 48.1 39.0 - 52.0 %   MCV 91.4 80.0 - 100.0 fL   MCH 30.6 26.0 - 34.0 pg   MCHC 33.5 30.0 - 36.0 g/dL   RDW 12.3 11.5 - 15.5 %   Platelets 328 150 - 400 K/uL   nRBC 0.0 0.0 - 0.2 %    Comment: Performed at [facility], [place] [address], [place], [place] [address]  Urinalysis, Routine w reflex microscopic     Status: Abnormal   Collection Time: 10/09/18  6:19 PM  Result Value Ref Range   Color, Urine YELLOW YELLOW   APPearance CLEAR CLEAR   Specific Gravity, Urine 1.026 1.005 - 1.030   pH 5.0 5.0 - 8.0   Glucose, UA NEGATIVE NEGATIVE mg/dL   Hgb urine dipstick SMALL (A) NEGATIVE   Bilirubin Urine NEGATIVE NEGATIVE   Ketones, ur NEGATIVE NEGATIVE mg/dL   Protein, ur 30 (A) NEGATIVE mg/dL   Nitrite NEGATIVE NEGATIVE   Leukocytes, UA NEGATIVE NEGATIVE   RBC / HPF 0-5 0 - 5 RBC/hpf   WBC, UA 0-5 0 - 5 WBC/hpf    Bacteria, UA NONE SEEN NONE SEEN   Mucus PRESENT     Comment: Performed at [facility], [place] [address] [place], [place] [address]   Ct Abdomen Pelvis W Contrast  Result Date: 10/09/2018 CLINICAL DATA:  Severe lower abdominal pain since Tuesday. EXAM: CT ABDOMEN AND PELVIS WITH CONTRAST TECHNIQUE: Multidetector CT imaging of the abdomen and pelvis was performed using the standard protocol following bolus administration of intravenous contrast. CONTRAST:  135m ISOVUE-300 IOPAMIDOL (ISOVUE-300) INJECTION 61% COMPARISON:  03/22/2018 CT FINDINGS: Lower chest: Normal heart size. No pericardial effusion or thickening. Dependent bibasilar atelectasis. Hepatobiliary: Steatosis of the liver. No space-occupying mass. Normal gallbladder. Pancreas: Normal Spleen: Normal Adrenals/Urinary Tract: Adrenal glands are unremarkable. Kidneys are normal, without renal calculi, focal lesion, or hydronephrosis. Bladder is unremarkable. Stomach/Bowel: Diverticulosis of the sigmoid colon complicated by acute diverticulitis along the mid to distal portion. Subtle area of hypodensity consistent with an intramural abscess is noted measuring 15 x 11 mm along the posterior wall. No extraluminal gas. No pelvic abscess. The remainder of the gastrointestinal tract is nonacute. Vascular/Lymphatic: No significant vascular findings are present. No enlarged abdominal or pelvic lymph nodes. Reproductive: Normal size prostate with peripheral zone calcifications. Other: Small fat containing right inguinal hernia. Small periumbilical fat containing hernia. No free air. Musculoskeletal: Chronic moderate T12 compression. No aggressive osseous lesions. IMPRESSION: 1. Acute sigmoid diverticulitis with developing intramural abscess measuring 15 x 11 mm along the posterior wall. No extraluminal gas or pelvic abscess. 2. Hepatic steatosis. 3. Chronic T12 compression. Electronically Signed   By: DAshley RoyaltyM.D.   On: 10/09/2018 20:21     Pending Labs [employer](From admission, onward)    Start     Ordered   Signed and Held  CBC  Tomorrow morning,   R     Signed and Held   Signed and Held  Basic metabolic panel  Tomorrow morning,   R     Signed and Held          Vitals/Pain Today's Vitals   10/09/18 1915 10/09/18 2043 10/09/18 2047 10/09/18 2100  BP: (!) 142/88  (!) 162/94   Pulse: 72  89   Resp: 17  16   Temp:      TempSrc:      SpO2:  97%  96%   Weight:      Height:      PainSc:  8   3     Isolation Precautions No active isolations  Medications Medications  iopamidol (ISOVUE-300) 61 % injection (has no administration in time range)  sodium chloride (PF) 0.9 % injection (has no administration in time range)  sodium chloride 0.9 % bolus 1,000 mL (0 mLs Intravenous Stopped 10/09/18 1953)  ondansetron (ZOFRAN) injection 4 mg (4 mg Intravenous Given 10/09/18 1702)  HYDROmorphone (DILAUDID) injection 1 mg (1 mg Intravenous Given 10/09/18 1703)  iopamidol (ISOVUE-300) 61 % injection 100 mL (100 mLs Intravenous Contrast Given 10/09/18 1926)  HYDROmorphone (DILAUDID) injection 1 mg (1 mg Intravenous Given 10/09/18 2044)    Mobility walks

## 2018-10-09 NOTE — ED Notes (Signed)
Report given to Richelle, RN.

## 2018-10-09 NOTE — ED Triage Notes (Signed)
Pt c/o abd pains with n/v/d since Monday. Reports Hx diverticulitis. was out of work past 2 days. Reports hasnt vomited since Monday but having small loose stools. .Marland Kitchen

## 2018-10-09 NOTE — ED Provider Notes (Signed)
[facility]-EMERGENCY DEPT Provider Note   CSN: [id number] Arrival date & time: 10/09/18  1346   History   Chief Complaint Chief Complaint  Patient presents with  . Abdominal Pain  . Diarrhea    HPI Paul Gilmore is a 42 y.o. male with a past medical history significant for hypertension, diverticulitis with perforation and abscess, obesity presents for evaluation pain.  Patient states he developed left lower quadrant abdominal pain approximately 4 days ago.  Rates his pain a 8/10. Pain has been worsening over the last 4 days.  Patient states approximately 4 days ago he had nausea with 3 episodes of nonbloody, nonbilious emesis.  Has not had an episode of emesis in 3 days.  Patient states his pain feels similar to his last episode of diverticulitis.  Admits to 2 episodes of nonbloody diarrhea over the last 24 hours. States he called Dr. Sheliah Hatch with [facility] surgery where he was told to proceed to the emergency room for CT for evaluation of recurrent diverticulitis.  Patient states he was admitted to the hospital for 17 days in April for diverticulitis of the sigmoid colon with perforation and abscess.  Denies fever, chills, chest pain, shortness of breath,  constipation, melena, bright red blood per rectum, dysuria. Denies hx of abdominal surgeries. Last PO intake 12pm.  Patient states he was followed up with [facility] and was told he needed to lose weight prior to elective colectomy.  Patient states he has had an intentional 50 pound weight loss since this visit.  History obtained from patient.  No interpreter was used.  HPI  Past Medical History:  Diagnosis Date  . Allergy   . Anxiety   . Bowel obstruction (HCC)   . Diverticulitis of large intestine with perforation and abscess 02/2018  . GERD (gastroesophageal reflux disease)   . HTN (hypertension)     Patient Active Problem List   Diagnosis Date Noted  . Thoracic back pain 04/01/2018   . Diverticulitis of intestine with abscess 03/22/2018  . Obesity, Class III, BMI 40-49.9 (morbid obesity) (HCC) 03/08/2018  . Perforation of sigmoid colon due to diverticulitis 03/03/2018  . Hyperglycemia 03/03/2018  . Right foot pain 06/14/2017  . Great toe pain, left 03/04/2017  . Lower back injury 09/24/2016  . Hand laceration 09/24/2016  . Neck pain 02/08/2014    Past Surgical History:  Procedure Laterality Date  . NO PAST SURGERIES          Home Medications    Prior to Admission medications   Medication Sig Start Date End Date Taking? Authorizing Provider  acetaminophen (TYLENOL 8 HOUR) 650 MG CR tablet Take 1,300 mg by mouth daily as needed (headache).     [provider]  ALBUTEROL SULFATE HFA IN Inhale 1-2 Inhalers into the lungs as needed.    [provider]  amLODipine (NORVASC) 5 MG tablet Take 1 tablet (5 mg total) by mouth daily. 06/11/18   Armbruster, Willaim Rayas, MD    Family History Family History  Problem Relation Age of Onset  . COPD Mother   . Diverticulitis Mother   . Alcohol abuse Father   . Hypertension Father   . Throat cancer Father   . Esophageal cancer Father   . Colon cancer Neg Hx   . Rectal cancer Neg Hx   . Stomach cancer Neg Hx     Social History Social History   Tobacco Use  . Smoking status: Never Smoker  . Smokeless  tobacco: Former Neurosurgeon    Types: Chew  Substance Use Topics  . Alcohol use: Yes    Alcohol/week: 0.0 standard drinks    Comment: moderate  . Drug use: Yes    Types: Marijuana    Comment: this am 1 dab pen     Allergies   Patient has no known allergies.   Review of Systems Review of Systems  Constitutional: Negative.   Respiratory: Negative.   Cardiovascular: Negative.   Gastrointestinal: Positive for abdominal pain, diarrhea, nausea and vomiting. Negative for abdominal distention, anal bleeding, blood in stool, constipation and rectal pain.  Genitourinary: Negative.   Musculoskeletal:  Negative.   Skin: Negative.   Neurological: Negative.   All other systems reviewed and are negative.    Physical Exam Updated Vital Signs BP (!) 156/122 (BP Location: Right Arm)   Pulse 90   Temp 97.7 F (36.5 C) (Oral)   Resp 18   Ht 6' (1.829 m)   Wt 127.5 kg   SpO2 100%   BMI 38.11 kg/m   Physical Exam  Constitutional: He appears well-developed and well-nourished.  Non-toxic appearance. He does not appear ill. No distress.  HENT:  Head: Atraumatic.  Mouth/Throat: Oropharynx is clear and moist.  Eyes: Pupils are equal, round, and reactive to light.  Neck: Normal range of motion. Neck supple.  Cardiovascular: Normal rate, regular rhythm, normal heart sounds and intact distal pulses. Exam reveals no gallop.  No murmur heard. Pulmonary/Chest: Effort normal and breath sounds normal. No stridor. No respiratory distress. He has no wheezes. He has no rhonchi. He has no rales. He exhibits no tenderness.  Abdominal: Soft. Normal appearance. He exhibits no shifting dullness, no distension, no fluid wave, no ascites, no pulsatile midline mass and no mass. There is no hepatosplenomegaly. There is tenderness in the suprapubic area and left lower quadrant. There is no rigidity, no rebound, no CVA tenderness, no tenderness at McBurney's point and negative Murphy's sign.  Musculoskeletal: Normal range of motion.  Neurological: He is alert.  Skin: Skin is warm and dry. He is not diaphoretic.  Psychiatric: He has a normal mood and affect.  Nursing note and vitals reviewed.    ED Treatments / Results  Labs (all labs ordered are listed, but only abnormal results are displayed) Labs Reviewed  COMPREHENSIVE METABOLIC PANEL - Abnormal; Notable for the following components:      Result Value   Glucose, Bld 112 (*)    All other components within normal limits  CBC - Abnormal; Notable for the following components:   WBC 12.3 (*)    All other components within normal limits  URINALYSIS,  ROUTINE W REFLEX MICROSCOPIC - Abnormal; Notable for the following components:   Hgb urine dipstick SMALL (*)    Protein, ur 30 (*)    All other components within normal limits  LIPASE, BLOOD    EKG None  Radiology Ct Abdomen Pelvis W Contrast  Result Date: 10/09/2018 CLINICAL DATA:  Severe lower abdominal pain since Tuesday. EXAM: CT ABDOMEN AND PELVIS WITH CONTRAST TECHNIQUE: Multidetector CT imaging of the abdomen and pelvis was performed using the standard protocol following bolus administration of intravenous contrast. CONTRAST:  ISOVUE-300 IOPAMIDOL (ISOVUE-300) INJECTION 61% COMPARISON:  03/22/2018 CT FINDINGS: Lower chest: Normal heart size. No pericardial effusion or thickening. Dependent bibasilar atelectasis. Hepatobiliary: Steatosis of the liver. No space-occupying mass. Normal gallbladder. Pancreas: Normal Spleen: Normal Adrenals/Urinary Tract: Adrenal glands are unremarkable. Kidneys are normal, without renal calculi, focal lesion, or hydronephrosis.  Bladder is unremarkable. Stomach/Bowel: Diverticulosis of the sigmoid colon complicated by acute diverticulitis along the mid to distal portion. Subtle area of hypodensity consistent with an intramural abscess is noted measuring 15 x 11 mm along the posterior wall. No extraluminal gas. No pelvic abscess. The remainder of the gastrointestinal tract is nonacute. Vascular/Lymphatic: No significant vascular findings are present. No enlarged abdominal or pelvic lymph nodes. Reproductive: Normal size prostate with peripheral zone calcifications. Other: Small fat containing right inguinal hernia. Small periumbilical fat containing hernia. No free air. Musculoskeletal: Chronic moderate T12 compression. No aggressive osseous lesions. IMPRESSION: 1. Acute sigmoid diverticulitis with developing intramural abscess measuring 15 x 11 mm along the posterior wall. No extraluminal gas or pelvic abscess. 2. Hepatic steatosis. 3. Chronic T12 compression.  Electronically Signed   By: Tollie Ethavid  Kwon M.D.   On: 10/09/2018 20:21    Procedures Procedures (including critical care time)  Medications Ordered in ED Medications  sodium chloride 0.9 % bolus 1,000 mL (has no administration in time range)  ondansetron (ZOFRAN) injection 4 mg (has no administration in time range)  HYDROmorphone (DILAUDID) injection 1 mg (has no administration in time range)     Initial Impression / Assessment and Plan / ED Course  I have reviewed the triage vital signs and the nursing notes.  Pertinent labs & imaging results that were available during my care of the patient were reviewed by me and considered in my medical decision making (see chart for details).  42 year old male who appears otherwise well presents for evaluation of abdominal pain. Afebrile, nonseptic, non-ill-appearing.  Patient with history of diverticulitis with perforation and abscess which required 17 days of hospitalization in April.  Abdominal tenderness to left lower quadrant.  No rebound or guarding.  Mucous membranes moist. Patient [facility] and was told to proceed to the emergency department for CT scan.  Concern for recurrent diverticulitis given hx, will obtain labs, urine, CT scan and reevaluate.  1930: On reevaluation patient with improvement in pain. Labs with leukocytosis at  12.3, Lipase 22, Metabolic panel with mildly elevated glucose at 112, otherwise no evidence of electrolyte, renal or liver abnormalities.  Urinalysis without evidence of infection  2030: CT scan shows evidence of diverticulitis with developing intramural abscess.  Will consult with general surgery for admission and abx.  2050: Consulted with Dr. Maisie Fushomas, general surgery who agrees for admission of patient and IV abx.      Final Clinical Impressions(s) / ED Diagnoses   Final diagnoses:  None    ED Discharge Orders    None       Archie Atilano A, PA-C 10/09/18  2142    Linwood DibblesKnapp, Jon, MD 10/10/18 2325

## 2018-10-09 NOTE — ED Notes (Signed)
Pt requesting something to drink, PAC suggested a cold cloth for pt face until consult with surgeon. Pt agreed that was fine and will wait.

## 2018-10-09 NOTE — H&P (Signed)
Paul Gilmore is an 42 y.o. male.   Chief Complaint: abd pain HPI: 42 year old male who presents to the hospital with complaints of left lower quadrant pain worsening over the past 48 hours.  Patient has a history of perforated diverticulitis with hospitalizations in April.  This resolved with medical management.  Patient tells me that he saw Dr. Kieth Brightly in the office and it was recommended that he try to lose weight prior to his colon surgery.  He is done well with this and lost approximately 50 pounds.  He reports over the past couple days having increasing left lower quadrant pain.  He called the office this morning and it was recommended that he present to the emergency department for evaluation.  Past Medical History:  Diagnosis Date  . Allergy   . Anxiety   . Bowel obstruction ([place])   . Diverticulitis of large intestine with perforation and abscess 02/2018  . GERD (gastroesophageal reflux disease)   . HTN (hypertension)     Past Surgical History:  Procedure Laterality Date  . NO PAST SURGERIES      Family History  Problem Relation Age of Onset  . COPD Mother   . Diverticulitis Mother   . Alcohol abuse Father   . Hypertension Father   . Throat cancer Father   . Esophageal cancer Father   . Colon cancer Neg Hx   . Rectal cancer Neg Hx   . Stomach cancer Neg Hx    Social History:  reports that he has never smoked. He quit smokeless tobacco use about 4 years ago.  His smokeless tobacco use included chew. He reports that he drinks alcohol. He reports that he has current or past drug history. Drug: Marijuana.  Allergies: No Known Allergies   (Not in a hospital admission)  Results for orders placed or performed during the hospital encounter of 10/09/18 (from the past 48 hour(s))  Lipase, blood     Status: None   Collection Time: 10/09/18  2:37 PM  Result Value Ref Range   Lipase 22 11 - 51 U/L    Comment: Performed at [facility], [place] [address],  [place], [place] [address]  Comprehensive metabolic panel     Status: Abnormal   Collection Time: 10/09/18  2:37 PM  Result Value Ref Range   Sodium 138 135 - 145 mmol/L   Potassium 3.9 3.5 - 5.1 mmol/L   Chloride 103 98 - 111 mmol/L   CO2 24 22 - 32 mmol/L   Glucose, Bld 112 (H) 70 - 99 mg/dL   BUN 17 6 - 20 mg/dL   Creatinine, Ser 0.99 0.61 - 1.24 mg/dL   Calcium 9.2 8.9 - 10.3 mg/dL   Total Protein 7.6 6.5 - 8.1 g/dL   Albumin 4.0 3.5 - 5.0 g/dL   AST 18 15 - 41 U/L   ALT 24 0 - 44 U/L   Alkaline Phosphatase 75 38 - 126 U/L   Total Bilirubin 0.7 0.3 - 1.2 mg/dL   GFR calc non Af Amer >60 >60 mL/min   GFR calc Af Amer >60 >60 mL/min    Comment: (NOTE) The eGFR has been calculated using the CKD EPI equation. This calculation has not been validated in all clinical situations. eGFR's persistently <60 mL/min signify possible Chronic Kidney Disease.    Anion gap 11 5 - 15    Comment: Performed at [facility], [place] [address] [place], [place] [address]  CBC  Status: Abnormal   Collection Time: 10/09/18  2:37 PM  Result Value Ref Range   WBC 12.3 (H) 4.0 - 10.5 K/uL   RBC 5.26 4.22 - 5.81 MIL/uL   Hemoglobin 16.1 13.0 - 17.0 g/dL   HCT 48.1 39.0 - 52.0 %   MCV 91.4 80.0 - 100.0 fL   MCH 30.6 26.0 - 34.0 pg   MCHC 33.5 30.0 - 36.0 g/dL   RDW 12.3 11.5 - 15.5 %   Platelets 328 150 - 400 K/uL   nRBC 0.0 0.0 - 0.2 %    Comment: Performed at [facility], [place] [address] [place], [place] [address]  Urinalysis, Routine w reflex microscopic     Status: Abnormal   Collection Time: 10/09/18  6:19 PM  Result Value Ref Range   Color, Urine YELLOW YELLOW   APPearance CLEAR CLEAR   Specific Gravity, Urine 1.026 1.005 - 1.030   pH 5.0 5.0 - 8.0   Glucose, UA NEGATIVE NEGATIVE mg/dL   Hgb urine dipstick SMALL (A) NEGATIVE   Bilirubin Urine NEGATIVE NEGATIVE   Ketones, ur NEGATIVE NEGATIVE mg/dL   Protein, ur 30 (A) NEGATIVE mg/dL   Nitrite  NEGATIVE NEGATIVE   Leukocytes, UA NEGATIVE NEGATIVE   RBC / HPF 0-5 0 - 5 RBC/hpf   WBC, UA 0-5 0 - 5 WBC/hpf   Bacteria, UA NONE SEEN NONE SEEN   Mucus PRESENT     Comment: Performed at [facility], [place] [address], [place], [place] [address]   Ct Abdomen Pelvis W Contrast  Result Date: 10/09/2018 CLINICAL DATA:  Severe lower abdominal pain since Tuesday. EXAM: CT ABDOMEN AND PELVIS WITH CONTRAST TECHNIQUE: Multidetector CT imaging of the abdomen and pelvis was performed using the standard protocol following bolus administration of intravenous contrast. CONTRAST:  170m ISOVUE-300 IOPAMIDOL (ISOVUE-300) INJECTION 61% COMPARISON:  03/22/2018 CT FINDINGS: Lower chest: Normal heart size. No pericardial effusion or thickening. Dependent bibasilar atelectasis. Hepatobiliary: Steatosis of the liver. No space-occupying mass. Normal gallbladder. Pancreas: Normal Spleen: Normal Adrenals/Urinary Tract: Adrenal glands are unremarkable. Kidneys are normal, without renal calculi, focal lesion, or hydronephrosis. Bladder is unremarkable. Stomach/Bowel: Diverticulosis of the sigmoid colon complicated by acute diverticulitis along the mid to distal portion. Subtle area of hypodensity consistent with an intramural abscess is noted measuring 15 x 11 mm along the posterior wall. No extraluminal gas. No pelvic abscess. The remainder of the gastrointestinal tract is nonacute. Vascular/Lymphatic: No significant vascular findings are present. No enlarged abdominal or pelvic lymph nodes. Reproductive: Normal size prostate with peripheral zone calcifications. Other: Small fat containing right inguinal hernia. Small periumbilical fat containing hernia. No free air. Musculoskeletal: Chronic moderate T12 compression. No aggressive osseous lesions. IMPRESSION: 1. Acute sigmoid diverticulitis with developing intramural abscess measuring 15 x 11 mm along the posterior wall. No extraluminal gas or pelvic abscess. 2.  Hepatic steatosis. 3. Chronic T12 compression. Electronically Signed   By: DAshley RoyaltyM.D.   On: 10/09/2018 20:21    Review of Systems  Constitutional: Negative for chills and fever.  HENT: Negative for hearing loss.   Eyes: Negative for blurred vision.  Respiratory: Negative for cough and shortness of breath.   Cardiovascular: Negative for chest pain and palpitations.  Gastrointestinal: Positive for abdominal pain. Negative for nausea and vomiting.  Genitourinary: Negative for dysuria and urgency.  Skin: Negative for itching and rash.  Neurological: Negative for dizziness and headaches.    Blood pressure (!) 162/94, pulse 89, temperature 97.7 F (36.5  C), temperature source Oral, resp. rate 16, height 6' (1.829 m), weight 127.5 kg, SpO2 96 %. Physical Exam  Constitutional: He is oriented to person, place, and time. He appears well-developed and well-nourished.  HENT:  Head: Normocephalic and atraumatic.  Eyes: Pupils are equal, round, and reactive to light. Conjunctivae and EOM are normal.  Neck: Normal range of motion. Neck supple.  Cardiovascular: Normal rate and regular rhythm.  Respiratory: Effort normal. No respiratory distress.  GI: Soft. There is tenderness (Left lower quadrant). There is no rebound and no guarding.  Musculoskeletal: Normal range of motion.  Neurological: He is alert and oriented to person, place, and time.     Assessment/Plan 42 year old obese male with recurrent diverticulitis.  CT scan shows inflammation of the sigmoid colon with intramural abscess.  White blood cell count is mildly elevated.  He will be admitted to the floor and placed on IV antibiotics.  I will put him on a clear liquid diet and IV fluids as well.  Hopefully this will resolve and he can continue his plan for eventual colectomy.  Rosario Adie, MD 94/80/1655, 9:29 PM

## 2018-10-09 NOTE — ED Notes (Signed)
Pt states that he has extreme headache, would pain medication as soon as possible

## 2018-10-10 LAB — BASIC METABOLIC PANEL
Anion gap: 10 (ref 5–15)
BUN: 14 mg/dL (ref 6–20)
CHLORIDE: 106 mmol/L (ref 98–111)
CO2: 26 mmol/L (ref 22–32)
CREATININE: 0.96 mg/dL (ref 0.61–1.24)
Calcium: 8.6 mg/dL — ABNORMAL LOW (ref 8.9–10.3)
GFR calc Af Amer: >60 mL/min (ref >60)
GFR calc non Af Amer: >60 mL/min (ref >60)
Glucose, Bld: 114 mg/dL — ABNORMAL HIGH (ref 70–99)
Potassium: 4.7 mmol/L (ref 3.5–5.1)
SODIUM: 142 mmol/L (ref 135–145)

## 2018-10-10 LAB — CBC
HCT: 47.4 % (ref 39.0–52.0)
HEMOGLOBIN: 15.5 g/dL (ref 13.0–17.0)
MCH: 30.4 pg (ref 26.0–34.0)
MCHC: 32.7 g/dL (ref 30.0–36.0)
MCV: 92.9 fL (ref 80.0–100.0)
Platelets: 321 10*3/uL (ref 150–400)
RBC: 5.1 MIL/uL (ref 4.22–5.81)
RDW: 12.2 % (ref 11.5–15.5)
WBC: 10.5 10*3/uL (ref 4.0–10.5)
nRBC: 0 % (ref 0.0–0.2)

## 2018-10-10 MED ORDER — LIP MEDEX EX OINT
TOPICAL_OINTMENT | CUTANEOUS | Status: AC
  Administered 2018-10-10: 16:00:00
  Filled 2018-10-10: qty 7

## 2018-10-10 MED ORDER — OXYCODONE HCL 5 MG PO TABS
5.0000 mg | ORAL_TABLET | ORAL | Status: DC | PRN
  Administered 2018-10-10 – 2018-10-11 (×4): 5 mg via ORAL
  Filled 2018-10-10 (×4): qty 1

## 2018-10-10 MED ORDER — ACETAMINOPHEN 500 MG PO TABS
1000.0000 mg | ORAL_TABLET | Freq: Four times a day (QID) | ORAL | Status: DC
  Administered 2018-10-10 – 2018-10-11 (×4): 1000 mg via ORAL
  Filled 2018-10-10 (×5): qty 2

## 2018-10-10 MED ORDER — IBUPROFEN 200 MG PO TABS
400.0000 mg | ORAL_TABLET | Freq: Three times a day (TID) | ORAL | Status: DC
  Administered 2018-10-10 – 2018-10-11 (×4): 400 mg via ORAL
  Filled 2018-10-10 (×4): qty 2

## 2018-10-10 NOTE — Progress Notes (Signed)
Nutrition Brief Note  Patient identified on the Malnutrition Screening Tool (MST) Report  Screen inappropriate as patient has been trying to lose weight via dietary changes.   Wt Readings from Last 15 Encounters:  10/09/18 127.5 kg  08/15/18 131.5 kg  06/11/18 131.5 kg  04/28/18 136.1 kg  03/31/18 (!) 137 kg  03/22/18 (!) 138.3 kg  03/04/18 (!) 140.3 kg  02/24/18 (!) 140.6 kg  01/21/18 (!) 145.2 kg  06/13/17 136.1 kg  03/01/17 (!) 139.7 kg  10/26/16 136.1 kg  10/15/16 (!) 138.3 kg  09/26/16 136.1 kg  09/19/16 131.5 kg    Body mass index is 38.11 kg/m. Patient meets criteria for obesity based on current BMI.   Current diet order is full liquids.  Labs and medications reviewed.   No nutrition interventions warranted at this time. If nutrition issues arise, please consult RD.   Tilda FrancoLindsey Kriste Broman, MS, RD, LDN [facility] Clinical Dietitian Pager: [phone] After Hours Pager: [phone]

## 2018-10-10 NOTE — Progress Notes (Signed)
[facility] Progress Note     Subjective: CC:  Still with intermittent suprapubic and LLQ pain, worse with movement, temporarily relieved by IV pain meds. Denies nausea or emesis. Had a loose, non-bloody stool yesterday. States he has been taking in only liquids since Tuesday 11/12.   Objective: Vital signs in last 24 hours: Temp:  [97.4 F (36.3 C)-98.7 F (37.1 C)] 98.7 F (37.1 C) (11/15 0505) Pulse Rate:  [72-94] 76 (11/15 0505) Resp:  [15-20] 20 (11/15 0505) BP: (142-167)/(88-122) 146/90 (11/15 0505) SpO2:  [95 %-100 %] 96 % (11/15 0505) Weight:  [127.5 kg] 127.5 kg (11/14 1407) Last BM Date: 10/09/18  Intake/Output from previous day: 11/14 0701 - 11/15 0700 In: 3602 [I.V.:671.4; IV Piggyback:2930.6] Out: 0  Intake/Output this shift: No intake/output data recorded.  PE: Gen:  Alert, NAD, pleasant Card:  Regular rate and rhythm, pedal pulses 2+ BL Pulm:  Normal effort, clear to auscultation bilaterally Abd: Soft, obese, mild suprapubic and LLQ tenderness without peritonitis, +BS Skin: warm and dry, no rashes  Psych: A&Ox3   Lab Results:  Recent Labs    10/09/18 1437 10/10/18 0412  WBC 12.3* 10.5  HGB 16.1 15.5  HCT 48.1 47.4  PLT 328 321   BMET Recent Labs    10/09/18 1437 10/10/18 0412  NA 138 142  K 3.9 4.7  CL 103 106  CO2 24 26  GLUCOSE 112* 114*  BUN 17 14  CREATININE 0.99 0.96  CALCIUM 9.2 8.6*   PT/INR No results for input(s): LABPROT, INR in the last 72 hours. CMP     Component Value Date/Time   NA 142 10/10/2018 0412   K 4.7 10/10/2018 0412   CL 106 10/10/2018 0412   CO2 26 10/10/2018 0412   GLUCOSE 114 (H) 10/10/2018 0412   BUN 14 10/10/2018 0412   CREATININE 0.96 10/10/2018 0412   CALCIUM 8.6 (L) 10/10/2018 0412   PROT 7.6 10/09/2018 1437   ALBUMIN 4.0 10/09/2018 1437   AST 18 10/09/2018 1437   ALT 24 10/09/2018 1437   ALKPHOS 75 10/09/2018 1437   BILITOT 0.7 10/09/2018 1437   GFRNONAA >60 10/10/2018 0412    GFRAA >60 10/10/2018 0412   Lipase     Component Value Date/Time   LIPASE 22 10/09/2018 1437       Studies/Results: Ct Abdomen Pelvis W Contrast  Result Date: 10/09/2018 CLINICAL DATA:  Severe lower abdominal pain since Tuesday. EXAM: CT ABDOMEN AND PELVIS WITH CONTRAST TECHNIQUE: Multidetector CT imaging of the abdomen and pelvis was performed using the standard protocol following bolus administration of intravenous contrast. CONTRAST:  ISOVUE-300 IOPAMIDOL (ISOVUE-300) INJECTION 61% COMPARISON:  03/22/2018 CT FINDINGS: Lower chest: Normal heart size. No pericardial effusion or thickening. Dependent bibasilar atelectasis. Hepatobiliary: Steatosis of the liver. No space-occupying mass. Normal gallbladder. Pancreas: Normal Spleen: Normal Adrenals/Urinary Tract: Adrenal glands are unremarkable. Kidneys are normal, without renal calculi, focal lesion, or hydronephrosis. Bladder is unremarkable. Stomach/Bowel: Diverticulosis of the sigmoid colon complicated by acute diverticulitis along the mid to distal portion. Subtle area of hypodensity consistent with an intramural abscess is noted measuring 15 x 11 mm along the posterior wall. No extraluminal gas. No pelvic abscess. The remainder of the gastrointestinal tract is nonacute. Vascular/Lymphatic: No significant vascular findings are present. No enlarged abdominal or pelvic lymph nodes. Reproductive: Normal size prostate with peripheral zone calcifications. Other: Small fat containing right inguinal hernia. Small periumbilical fat containing hernia. No free air. Musculoskeletal: Chronic moderate T12 compression. No aggressive osseous  lesions. IMPRESSION: 1. Acute sigmoid diverticulitis with developing intramural abscess measuring 15 x 11 mm along the posterior wall. No extraluminal gas or pelvic abscess. 2. Hepatic steatosis. 3. Chronic T12 compression. Electronically Signed   By: Tollie Ethavid  Kwon M.D.   On: 10/09/2018 20:21     Anti-infectives: Anti-infectives (From admission, onward)   Start     Dose/Rate Route Frequency Ordered Stop   10/09/18 2300  cefTRIAXone (ROCEPHIN) 2 g in sodium chloride 0.9 % 100 mL IVPB     2 g 200 mL/hr over 30 Minutes Intravenous Daily at bedtime 10/09/18 2247     10/09/18 2300  metroNIDAZOLE (FLAGYL) IVPB 500 mg     500 mg 100 mL/hr over 60 Minutes Intravenous Every 8 hours 10/09/18 2247       Assessment/Plan Recurrent sigmoid diverticulitis with intramural abscess (15x11 mm) - hospitalized for diverticulitis in the past, follow by Dr. Sheliah HatchKinsinger - afebrile, VSS, leukocytosis resolved (10.5)  - continue IV abx - adv diet to full liquids - schedule non-narcotic meds to help with pain  FEN - FLD ID - Rocephin/Flagyl 11/14 >>  VTE - SCD's, Lovenox Foley - none Follow up - Dr. Sheliah HatchKinsinger    LOS: 1 day    Hosie SpangleElizabeth Arzell Mcgeehan, [facility] Pager: [phone]

## 2018-10-11 MED ORDER — AMOXICILLIN-POT CLAVULANATE 875-125 MG PO TABS: 1.0000 | ORAL_TABLET | Freq: Two times a day (BID) | ORAL | 0 refills | Status: AC

## 2018-10-11 MED ORDER — OXYCODONE HCL 5 MG PO TABS: 5.0000 mg | ORAL_TABLET | Freq: Four times a day (QID) | ORAL | 0 refills | Status: DC | PRN

## 2018-10-11 NOTE — Progress Notes (Signed)
   Subjective/Chief Complaint: No complaints. Feels better   Objective: Vital signs in last 24 hours: Temp:  [97.5 F (36.4 C)-98.4 F (36.9 C)] 97.5 F (36.4 C) (11/16 0615) Pulse Rate:  [58-82] 77 (11/16 0639) Resp:  [16] 16 (11/16 0615) BP: (140-165)/(83-113) 165/105 (11/16 0639) SpO2:  [96 %-99 %] 99 % (11/16 0639) Last BM Date: 10/09/18  Intake/Output from previous day: 11/15 0701 - 11/16 0700 In: 2314.5 [P.O.:810; I.V.:1213.9; IV Piggyback:290.6] Out: -  Intake/Output this shift: No intake/output data recorded.  General appearance: alert and cooperative Resp: clear to auscultation bilaterally Cardio: regular rate and rhythm GI: soft, non-tender; bowel sounds normal; no masses,  no organomegaly  Lab Results:  Recent Labs    10/09/18 1437 10/10/18 0412  WBC 12.3* 10.5  HGB 16.1 15.5  HCT 48.1 47.4  PLT 328 321   BMET Recent Labs    10/09/18 1437 10/10/18 0412  NA 138 142  K 3.9 4.7  CL 103 106  CO2 24 26  GLUCOSE 112* 114*  BUN 17 14  CREATININE 0.99 0.96  CALCIUM 9.2 8.6*   PT/INR No results for input(s): LABPROT, INR in the last 72 hours. ABG No results for input(s): PHART, HCO3 in the last 72 hours.  Invalid input(s): PCO2, PO2  Studies/Results: Ct Abdomen Pelvis W Contrast  Result Date: 10/09/2018 CLINICAL DATA:  Severe lower abdominal pain since Tuesday. EXAM: CT ABDOMEN AND PELVIS WITH CONTRAST TECHNIQUE: Multidetector CT imaging of the abdomen and pelvis was performed using the standard protocol following bolus administration of intravenous contrast. CONTRAST:  100mL ISOVUE-300 IOPAMIDOL (ISOVUE-300) INJECTION 61% COMPARISON:  03/22/2018 CT FINDINGS: Lower chest: Normal heart size. No pericardial effusion or thickening. Dependent bibasilar atelectasis. Hepatobiliary: Steatosis of the liver. No space-occupying mass. Normal gallbladder. Pancreas: Normal Spleen: Normal Adrenals/Urinary Tract: Adrenal glands are unremarkable. Kidneys are  normal, without renal calculi, focal lesion, or hydronephrosis. Bladder is unremarkable. Stomach/Bowel: Diverticulosis of the sigmoid colon complicated by acute diverticulitis along the mid to distal portion. Subtle area of hypodensity consistent with an intramural abscess is noted measuring 15 x 11 mm along the posterior wall. No extraluminal gas. No pelvic abscess. The remainder of the gastrointestinal tract is nonacute. Vascular/Lymphatic: No significant vascular findings are present. No enlarged abdominal or pelvic lymph nodes. Reproductive: Normal size prostate with peripheral zone calcifications. Other: Small fat containing right inguinal hernia. Small periumbilical fat containing hernia. No free air. Musculoskeletal: Chronic moderate T12 compression. No aggressive osseous lesions. IMPRESSION: 1. Acute sigmoid diverticulitis with developing intramural abscess measuring 15 x 11 mm along the posterior wall. No extraluminal gas or pelvic abscess. 2. Hepatic steatosis. 3. Chronic T12 compression. Electronically Signed   By: Tollie Ethavid  Kwon M.D.   On: 10/09/2018 20:21    Anti-infectives: Anti-infectives (From admission, onward)   Start     Dose/Rate Route Frequency Ordered Stop   10/09/18 2300  cefTRIAXone (ROCEPHIN) 2 g in sodium chloride 0.9 % 100 mL IVPB     2 g 200 mL/hr over 30 Minutes Intravenous Daily at bedtime 10/09/18 2247     10/09/18 2300  metroNIDAZOLE (FLAGYL) IVPB 500 mg     500 mg 100 mL/hr over 60 Minutes Intravenous Every 8 hours 10/09/18 2247        Assessment/Plan: s/p * No surgery found * Advance diet Discharge  LOS: 2 days    Paul Gilmore 10/11/2018

## 2018-10-11 NOTE — Progress Notes (Signed)
Discussed with patient and spouse discharge instructions, both verbalized agreement and understanding. Patient to leave with all belongings to go home in private vehicle.

## 2018-10-16 NOTE — Discharge Summary (Signed)
Physician Discharge Summary  Patient ID: Paul Gilmore MRN: [id number] DOB/AGE: 02-15-1976 42 y.o.  Admit date: 10/09/2018 Discharge date: 10/11/2018  Admission Diagnoses:  Recurrent sigmoid diverticulitis History of diverticulitis with large bowel perforation and abscess 02/2018 Hypertension Anxiety GERD    Discharge Diagnoses:  Active Problems:   Diverticulitis   PROCEDURES: None  Hospital Course:42 year old male who presents to the hospital with complaints of left lower quadrant pain worsening over the past 48 hours.  Patient has a history of perforated diverticulitis with hospitalizations in April.  This resolved with medical management.  Patient tells me that he saw Dr. Sheliah Hatch in the office and it was recommended that he try to lose weight prior to his colon surgery.  He is done well with this and lost approximately 50 pounds.  He reports over the past couple days having increasing left lower quadrant pain.  He called the office this morning and it was recommended that he present to the emergency department for evaluation.  Patient was seen in the emergency department by Dr. Maisie Fus and admitted.  CT scan showed inflammation of the sigmoid colon with intramural abscess.  Placed on IV antibiotics.  Improved over the next 48 hours and was discharged home by Dr. Chevis Pretty on 10/11/2018, on p.o. Augmentin.  Dictation is taken from the medical record.  I did not see or participate in the patient's care.  Condition on discharge: Improved  CBC Latest Ref Rng & Units 10/10/2018 10/09/2018 03/24/2018  WBC 4.0 - 10.5 K/uL 10.5 12.3(H) 8.9  Hemoglobin 13.0 - 17.0 g/dL 40.9 81.1 91.4  Hematocrit 39.0 - 52.0 % 47.4 48.1 43.2  Platelets 150 - 400 K/uL 321 328 396   CMP Latest Ref Rng & Units 10/10/2018 10/09/2018 03/22/2018  Glucose 70 - 99 mg/dL 782(N) 562(Z) 308(M)  BUN 6 - 20 mg/dL 14 17 19   Creatinine 0.61 - 1.24 mg/dL 5.78 4.69 6.29  Sodium 135 - 145 mmol/L 142 138 137  Potassium  3.5 - 5.1 mmol/L 4.7 3.9 4.0  Chloride 98 - 111 mmol/L 106 103 100(L)  CO2 22 - 32 mmol/L 26 24 20(L)  Calcium 8.9 - 10.3 mg/dL 5.2(W) 9.2 9.4  Total Protein 6.5 - 8.1 g/dL - 7.6 8.1  Total Bilirubin 0.3 - 1.2 mg/dL - 0.7 1.2  Alkaline Phos 38 - 126 U/L - 75 90  AST 15 - 41 U/L - 18 28  ALT 0 - 44 U/L - 24 43    Disposition: Home  Discharge Instructions    Call MD for:  difficulty breathing, headache or visual disturbances   Complete by:  As directed    Call MD for:  extreme fatigue   Complete by:  As directed    Call MD for:  hives   Complete by:  As directed    Call MD for:  persistant dizziness or light-headedness   Complete by:  As directed    Call MD for:  persistant nausea and vomiting   Complete by:  As directed    Call MD for:  redness, tenderness, or signs of infection (pain, swelling, redness, odor or green/yellow discharge around incision site)   Complete by:  As directed    Call MD for:  severe uncontrolled pain   Complete by:  As directed    Call MD for:  temperature >100.4   Complete by:  As directed    Diet - low sodium heart healthy   Complete by:  As directed    Discharge  instructions   Complete by:  As directed    No limits   Increase activity slowly   Complete by:  As directed    No wound care   Complete by:  As directed      Allergies as of 10/11/2018   No Known Allergies     Medication List    TAKE these medications   albuterol 108 (90 Base) MCG/ACT inhaler Commonly known as:  PROVENTIL HFA;VENTOLIN HFA Inhale 1 puff into the lungs every 6 (six) hours as needed for wheezing or shortness of breath.   amLODipine 5 MG tablet Commonly known as:  NORVASC Take 1 tablet (5 mg total) by mouth daily.   amoxicillin-clavulanate 875-125 MG tablet Commonly known as:  AUGMENTIN Take 1 tablet by mouth 2 (two) times daily for 14 days.   ibuprofen 200 MG tablet Commonly known as:  ADVIL,MOTRIN Take 800 mg by mouth every 6 (six) hours as needed for  mild pain.   oxyCODONE 5 MG immediate release tablet Commonly known as:  Oxy IR/ROXICODONE Take 1 tablet (5 mg total) by mouth every 6 (six) hours as needed for moderate pain.   oxyCODONE 5 MG immediate release tablet Commonly known as:  Oxy IR/ROXICODONE Take 1 tablet (5 mg total) by mouth every 6 (six) hours as needed for moderate pain, severe pain or breakthrough pain.   PROBIOTIC PO Take 1 capsule by mouth daily.      Follow-up Information    Kinsinger, De BlanchLuke Aaron, MD Follow up.   Specialty:  General Surgery Why:  Call and schedule a follow up appointment to be seen in 3-4 weeks.  Contact information: [address] [place] [address] [phone]           Signed: Sherrie GeorgeJENNINGS,Kamali Nephew 10/16/2018, 2:13 PM

## 2018-10-27 ENCOUNTER — Encounter (HOSPITAL_COMMUNITY): Payer: Self-pay

## 2018-10-27 ENCOUNTER — Emergency Department (HOSPITAL_COMMUNITY): Payer: BLUE CROSS/BLUE SHIELD

## 2018-10-27 ENCOUNTER — Inpatient Hospital Stay (HOSPITAL_COMMUNITY)
Admission: EM | Admit: 2018-10-27 | Discharge: 2018-10-30 | DRG: 392 | Disposition: A | Discharge status: Home / Self Care | Payer: BLUE CROSS/BLUE SHIELD | Attending: Internal Medicine | Admitting: Internal Medicine

## 2018-10-27 ENCOUNTER — Other Ambulatory Visit: Payer: Self-pay

## 2018-10-27 DIAGNOSIS — Z79899 Other long term (current) drug therapy: Secondary | ICD-10-CM

## 2018-10-27 DIAGNOSIS — F419 Anxiety disorder, unspecified: Secondary | ICD-10-CM | POA: Diagnosis present

## 2018-10-27 DIAGNOSIS — Z8249 Family history of ischemic heart disease and other diseases of the circulatory system: Secondary | ICD-10-CM | POA: Diagnosis not present

## 2018-10-27 DIAGNOSIS — K5900 Constipation, unspecified: Secondary | ICD-10-CM | POA: Diagnosis present

## 2018-10-27 DIAGNOSIS — E669 Obesity, unspecified: Secondary | ICD-10-CM | POA: Diagnosis not present

## 2018-10-27 DIAGNOSIS — Z87891 Personal history of nicotine dependence: Secondary | ICD-10-CM

## 2018-10-27 DIAGNOSIS — R739 Hyperglycemia, unspecified: Secondary | ICD-10-CM | POA: Diagnosis present

## 2018-10-27 DIAGNOSIS — K5792 Diverticulitis of intestine, part unspecified, without perforation or abscess without bleeding: Secondary | ICD-10-CM | POA: Diagnosis present

## 2018-10-27 DIAGNOSIS — K5732 Diverticulitis of large intestine without perforation or abscess without bleeding: Principal | ICD-10-CM | POA: Diagnosis present

## 2018-10-27 DIAGNOSIS — K219 Gastro-esophageal reflux disease without esophagitis: Secondary | ICD-10-CM | POA: Diagnosis not present

## 2018-10-27 DIAGNOSIS — Z6838 Body mass index (BMI) 38.0-38.9, adult: Secondary | ICD-10-CM | POA: Diagnosis not present

## 2018-10-27 DIAGNOSIS — I10 Essential (primary) hypertension: Secondary | ICD-10-CM | POA: Diagnosis not present

## 2018-10-27 DIAGNOSIS — R109 Unspecified abdominal pain: Secondary | ICD-10-CM | POA: Diagnosis not present

## 2018-10-27 LAB — CBC
HCT: 49.1 % (ref 39.0–52.0)
HEMOGLOBIN: 16.3 g/dL (ref 13.0–17.0)
MCH: 30.4 pg (ref 26.0–34.0)
MCHC: 33.2 g/dL (ref 30.0–36.0)
MCV: 91.4 fL (ref 80.0–100.0)
PLATELETS: 427 10*3/uL — AB (ref 150–400)
RBC: 5.37 MIL/uL (ref 4.22–5.81)
RDW: 12.1 % (ref 11.5–15.5)
WBC: 11.5 10*3/uL — ABNORMAL HIGH (ref 4.0–10.5)
nRBC: 0 % (ref 0.0–0.2)

## 2018-10-27 LAB — COMPREHENSIVE METABOLIC PANEL
ALBUMIN: 4 g/dL (ref 3.5–5.0)
ALK PHOS: 78 U/L (ref 38–126)
ALT: 27 U/L (ref 0–44)
AST: 22 U/L (ref 15–41)
Anion gap: 11 (ref 5–15)
BUN: 17 mg/dL (ref 6–20)
CALCIUM: 9.4 mg/dL (ref 8.9–10.3)
CO2: 25 mmol/L (ref 22–32)
CREATININE: 0.97 mg/dL (ref 0.61–1.24)
Chloride: 105 mmol/L (ref 98–111)
GFR calc Af Amer: >60 mL/min (ref >60)
GFR calc non Af Amer: >60 mL/min (ref >60)
GLUCOSE: 134 mg/dL — AB (ref 70–99)
Potassium: 3.9 mmol/L (ref 3.5–5.1)
Sodium: 141 mmol/L (ref 135–145)
Total Bilirubin: 0.5 mg/dL (ref 0.3–1.2)
Total Protein: 7.7 g/dL (ref 6.5–8.1)

## 2018-10-27 LAB — URINALYSIS, ROUTINE W REFLEX MICROSCOPIC
BILIRUBIN URINE: NEGATIVE
Glucose, UA: 150 mg/dL — AB
HGB URINE DIPSTICK: NEGATIVE
KETONES UR: NEGATIVE mg/dL
Leukocytes, UA: NEGATIVE
Nitrite: NEGATIVE
PROTEIN: NEGATIVE mg/dL
Specific Gravity, Urine: 1.026 (ref 1.005–1.030)
pH: 5 (ref 5.0–8.0)

## 2018-10-27 LAB — LIPASE, BLOOD: Lipase: 26 U/L (ref 11–51)

## 2018-10-27 MED ORDER — SODIUM CHLORIDE 0.9 % IV BOLUS
1000.0000 mL | Freq: Once | INTRAVENOUS | Status: AC
  Administered 2018-10-27: 1000 mL via INTRAVENOUS

## 2018-10-27 MED ORDER — METRONIDAZOLE IN NACL 5-0.79 MG/ML-% IV SOLN
500.0000 mg | Freq: Once | INTRAVENOUS | Status: AC
  Administered 2018-10-28: 500 mg via INTRAVENOUS
  Filled 2018-10-27: qty 100

## 2018-10-27 MED ORDER — METRONIDAZOLE IN NACL 5-0.79 MG/ML-% IV SOLN
500.0000 mg | Freq: Three times a day (TID) | INTRAVENOUS | Status: DC
  Administered 2018-10-28: 500 mg via INTRAVENOUS
  Filled 2018-10-27: qty 100

## 2018-10-27 MED ORDER — IOPAMIDOL (ISOVUE-300) INJECTION 61%
100.0000 mL | Freq: Once | INTRAVENOUS | Status: AC | PRN
  Administered 2018-10-27: 100 mL via INTRAVENOUS

## 2018-10-27 MED ORDER — ACETAMINOPHEN 650 MG RE SUPP: 650.0000 mg | Freq: Four times a day (QID) | RECTAL | Status: DC | PRN

## 2018-10-27 MED ORDER — ONDANSETRON HCL 4 MG/2ML IJ SOLN
4.0000 mg | Freq: Once | INTRAMUSCULAR | Status: AC
  Administered 2018-10-27: 4 mg via INTRAVENOUS
  Filled 2018-10-27: qty 2

## 2018-10-27 MED ORDER — CIPROFLOXACIN IN D5W 400 MG/200ML IV SOLN
400.0000 mg | Freq: Once | INTRAVENOUS | Status: AC
  Administered 2018-10-27: 400 mg via INTRAVENOUS
  Filled 2018-10-27: qty 200

## 2018-10-27 MED ORDER — SODIUM CHLORIDE (PF) 0.9 % IJ SOLN
INTRAMUSCULAR | Status: AC
  Administered 2018-10-27: 20:00:00
  Filled 2018-10-27: qty 50

## 2018-10-27 MED ORDER — IOPAMIDOL (ISOVUE-300) INJECTION 61%
INTRAVENOUS | Status: AC
  Administered 2018-10-27: 20:00:00
  Filled 2018-10-27: qty 100

## 2018-10-27 MED ORDER — HYDROMORPHONE HCL 1 MG/ML IJ SOLN
1.0000 mg | INTRAMUSCULAR | Status: DC | PRN
  Administered 2018-10-28 (×3): 1 mg via INTRAVENOUS
  Filled 2018-10-27 (×3): qty 1

## 2018-10-27 MED ORDER — ACETAMINOPHEN 325 MG PO TABS
650.0000 mg | ORAL_TABLET | Freq: Four times a day (QID) | ORAL | Status: DC | PRN
  Administered 2018-10-28: 650 mg via ORAL
  Filled 2018-10-27: qty 2

## 2018-10-27 MED ORDER — SODIUM CHLORIDE 0.9 % IV SOLN
INTRAVENOUS | Status: AC
  Administered 2018-10-28: via INTRAVENOUS

## 2018-10-27 MED ORDER — HYDROMORPHONE HCL 1 MG/ML IJ SOLN
1.0000 mg | Freq: Once | INTRAMUSCULAR | Status: AC
  Administered 2018-10-27: 1 mg via INTRAVENOUS
  Filled 2018-10-27: qty 1

## 2018-10-27 MED ORDER — ONDANSETRON HCL 4 MG/2ML IJ SOLN
4.0000 mg | Freq: Four times a day (QID) | INTRAMUSCULAR | Status: DC | PRN
  Administered 2018-10-28 – 2018-10-30 (×3): 4 mg via INTRAVENOUS
  Filled 2018-10-27 (×4): qty 2

## 2018-10-27 MED ORDER — ONDANSETRON HCL 4 MG PO TABS: 4.0000 mg | ORAL_TABLET | Freq: Four times a day (QID) | ORAL | Status: DC | PRN

## 2018-10-27 NOTE — H&P (Signed)
Paul Gilmore ZOX:[id number] DOB: [birth year] DOA: 10/27/2018     PCP: Patient, No Pcp Per   Outpatient Specialists: [facility]    Patient arrived to ER on 10/27/18 at 1201  Patient coming from: home Lives  With family    Chief Complaint:  Chief Complaint  Patient presents with  . Abdominal Pain    HPI: Paul Gilmore is a 42 y.o. male with medical history significant of obstruction, diverticulitis, GERD, hypertension    Presented with abdominal pain Recently admitted sigmoid diverticulitis improved with IV antibiotics and went home to complete 2-week total of oral antibiotics p.o. Augmentin he only finishes few days ago and had recurrence of left lower quadrant pain, States it never went away completely  not associated fevers or chills. Prior to this in April he had a perforated diverticulitis requiring hospitalization again with medical management  Regarding pertinent Chronic problems:   Hypertension on Norvasc While in ER: CT of abdomen showed inflammation around sigmoid colon no abscess The following Work up has been ordered so far:  Orders Placed This Encounter  Procedures  . CT ABDOMEN PELVIS W CONTRAST  . Lipase, blood  . Comprehensive metabolic panel  . CBC  . Urinalysis, Routine w reflex microscopic  . Diet NPO time specified  . Saline Lock IV, Maintain IV access  . Consult to general surgery  . Consult to hospitalist    Following Medications were ordered in ER: Medications  iopamidol (ISOVUE-300) 61 % injection (has no administration in time range)  sodium chloride (PF) 0.9 % injection (has no administration in time range)  ciprofloxacin (CIPRO) IVPB 400 mg (400 mg Intravenous New Bag/Given 10/27/18 2205)    And  metroNIDAZOLE (FLAGYL) IVPB 500 mg (has no administration in time range)  HYDROmorphone (DILAUDID) injection 1 mg (1 mg Intravenous Given 10/27/18 1823)  ondansetron (ZOFRAN) injection 4 mg (4 mg Intravenous Given 10/27/18 1822)  sodium  chloride 0.9 % bolus 1,000 mL (1,000 mLs Intravenous Bolus 10/27/18 1823)  iopamidol (ISOVUE-300) 61 % injection 100 mL (100 mLs Intravenous Contrast Given 10/27/18 2044)  HYDROmorphone (DILAUDID) injection 1 mg (1 mg Intravenous Given 10/27/18 2115)    Significant initial  Findings: Abnormal Labs Reviewed  COMPREHENSIVE METABOLIC PANEL - Abnormal; Notable for the following components:      Result Value   Glucose, Bld 134 (*)    All other components within normal limits  CBC - Abnormal; Notable for the following components:   WBC 11.5 (*)    Platelets 427 (*)    All other components within normal limits  URINALYSIS, ROUTINE W REFLEX MICROSCOPIC - Abnormal; Notable for the following components:   Glucose, UA 150 (*)    All other components within normal limits     Lactic Acid, Venous No results found for: LATICACIDVEN Lipase 26  Na 141 K 3.9  Cr  stable,   Lab Results  Component Value Date   CREATININE 0.97 10/27/2018   CREATININE 0.96 10/10/2018   CREATININE 0.99 10/09/2018      WBC  11.5  HG/HCT stable,     Component Value Date/Time   HGB 16.3 10/27/2018 1234   HCT 49.1 10/27/2018 1234         UA  no evidence of UTI      CTabd/pelvis - Pericolonic mesenteric inflammation at the site of prior sigmoid diverticulitis.   ECG:  Not obtained    ED Triage Vitals  Enc Vitals Group     BP 10/27/18  1216 (!) 145/92     Pulse Rate 10/27/18 1216 (!) 102     Resp 10/27/18 1216 16     Temp 10/27/18 1216 97.7 F (36.5 C)     Temp Source 10/27/18 1216 Oral     SpO2 10/27/18 1216 95 %     Weight 10/27/18 1218 285 lb (129.3 kg)     Height 10/27/18 1218 6' (1.829 m)     Head Circumference --      Peak Flow --      Pain Score 10/27/18 1216 8     Pain Loc --      Pain Edu? --      Excl. in GC? --   TMAX(24)@       Latest  Blood pressure 139/70, pulse 71, temperature 97.9 F (36.6 C), temperature source Oral, resp. rate 18, height 6' (1.829 m), weight 129.3 kg, SpO2  96 %.     ER Provider Called: General surgery    Paul Gilmore They Recommend admit to medicine for IV antibiotics Will see  in ER  Hospitalist was called for admission for recurrent diverticulitis now with mesenteric movement   Review of Systems:    Pertinent positives include: abdominal pain,   Constitutional:  No weight loss, night sweats, Fevers, chills, fatigue, weight loss  HEENT:  No headaches, Difficulty swallowing,Tooth/dental problems,Sore throat,  No sneezing, itching, ear ache, nasal congestion, post nasal drip,  Cardio-vascular:  No chest pain, Orthopnea, PND, anasarca, dizziness, palpitations.no Bilateral lower extremity swelling  GI:  No heartburn, indigestion, nausea, vomiting, diarrhea, change in bowel habits, loss of appetite, melena, blood in stool, hematemesis Resp:  no shortness of breath at rest. No dyspnea on exertion, No excess mucus, no productive cough, No non-productive cough, No coughing up of blood.No change in color of mucus.No wheezing. Skin:  no rash or lesions. No jaundice GU:  no dysuria, change in color of urine, no urgency or frequency. No straining to urinate.  No flank pain.  Musculoskeletal:  No joint pain or no joint swelling. No decreased range of motion. No back pain.  Psych:  No change in mood or affect. No depression or anxiety. No memory loss.  Neuro: no localizing neurological complaints, no tingling, no weakness, no double vision, no gait abnormality, no slurred speech, no confusion  All systems reviewed and apart from HOPI all are negative  Past Medical History:   Past Medical History:  Diagnosis Date  . Allergy   . Anxiety   . Bowel obstruction (HCC)   . Diverticulitis of large intestine with perforation and abscess 02/2018  . GERD (gastroesophageal reflux disease)   . HTN (hypertension)       Past Surgical History:  Procedure Laterality Date  . NO PAST SURGERIES      Social History:  Ambulatory   Independently       reports that he has never smoked. He quit smokeless tobacco use about 4 years ago.  His smokeless tobacco use included chew. He reports that he drinks alcohol. He reports that he has current or past drug history. Drug: Marijuana.     Family History:   Family History  Problem Relation Age of Onset  . COPD Mother   . Diverticulitis Mother   . Alcohol abuse Father   . Hypertension Father   . Throat cancer Father   . Esophageal cancer Father   . Colon cancer Neg Hx   . Rectal cancer Neg Hx   . Stomach cancer Neg Hx  Allergies: No Known Allergies   Prior to Admission medications   Medication Sig Start Date End Date Taking? Authorizing Provider  albuterol (PROVENTIL HFA;VENTOLIN HFA) 108 (90 Base) MCG/ACT inhaler Inhale 1 puff into the lungs every 6 (six) hours as needed for wheezing or shortness of breath.   Yes [provider]  amLODipine (NORVASC) 5 MG tablet Take 1 tablet (5 mg total) by mouth daily. 06/11/18  Yes Armbruster, Willaim RayasSteven P, MD  ibuprofen (ADVIL,MOTRIN) 200 MG tablet Take 400 mg by mouth every 6 (six) hours as needed for mild pain.    Yes [provider]  Probiotic Product (PROBIOTIC PO) Take 1 capsule by mouth daily.   Yes [provider]  psyllium (REGULOID) 0.52 g capsule Take 1.04 g by mouth every 4 (four) hours.   Yes [provider]  oxyCODONE (OXY IR/ROXICODONE) 5 MG immediate release tablet Take 1 tablet (5 mg total) by mouth every 6 (six) hours as needed for moderate pain. Patient not taking: Reported on 10/27/2018 10/11/18   Chevis Prettyoth, Paul III, MD  oxyCODONE (OXY IR/ROXICODONE) 5 MG immediate release tablet Take 1 tablet (5 mg total) by mouth every 6 (six) hours as needed for moderate pain, severe pain or breakthrough pain. Patient not taking: Reported on 10/27/2018 10/11/18   Glenna FellowsHoxworth, Benjamin, MD   Physical Exam: Blood pressure 139/70, pulse 71, temperature 97.9 F (36.6 C), temperature source Oral, resp. rate 18,  height 6' (1.829 m), weight 129.3 kg, SpO2 96 %. 1. General:  in No Acute distress  acutely ill -appearing 2. Psychological: Alert an  Oriented 3. Head/ENT:  Dry Mucous Membranes                          Head Non traumatic, neck supple                          Poor Dentition 4. SKIN:  decreased Skin turgor,  Skin clean Dry and intact no rash 5. Heart: Regular rate and rhythm no  Murmur, no Rub or gallop 6. Lungs:  Clear to auscultation bilaterally, no wheezes or crackles   7. Abdomen: Soft, LLQ tender, Non distended   Obese bowel sounds present 8. Lower extremities: no clubbing, cyanosis, or  edema 9. Neurologically Grossly intact, moving all 4 extremities equally  10. MSK: Normal range of motion   LABS:     Recent Labs  Lab 10/27/18 1234  WBC 11.5*  HGB 16.3  HCT 49.1  MCV 91.4  PLT 427*   Basic Metabolic Panel: Recent Labs  Lab 10/27/18 1234  NA 141  K 3.9  CL 105  CO2 25  GLUCOSE 134*  BUN 17  CREATININE 0.97  CALCIUM 9.4      Recent Labs  Lab 10/27/18 1234  AST 22  ALT 27  ALKPHOS 78  BILITOT 0.5  PROT 7.7  ALBUMIN 4.0   Recent Labs  Lab 10/27/18 1234  LIPASE 26   No results for input(s): AMMONIA in the last 168 hours.    HbA1C: No results for input(s): HGBA1C in the last 72 hours. CBG: No results for input(s): GLUCAP in the last 168 hours.    Urine analysis:    Component Value Date/Time   COLORURINE YELLOW 10/27/2018 1230   APPEARANCEUR CLEAR 10/27/2018 1230   LABSPEC 1.026 10/27/2018 1230   PHURINE 5.0 10/27/2018 1230   GLUCOSEU 150 (A) 10/27/2018 1230   HGBUR NEGATIVE 10/27/2018  1230   BILIRUBINUR NEGATIVE 10/27/2018 1230   KETONESUR NEGATIVE 10/27/2018 1230   PROTEINUR NEGATIVE 10/27/2018 1230   NITRITE NEGATIVE 10/27/2018 1230   LEUKOCYTESUR NEGATIVE 10/27/2018 1230     Cultures: No results found for: SDES, SPECREQUEST, CULT, REPTSTATUS   Radiological Exams on Admission: Ct Abdomen Pelvis W Contrast  Result Date:  10/27/2018 CLINICAL DATA:  42 y/o M; 2 days of left lower quadrant abdominal pain. Recent hospitalization for diverticulitis. EXAM: CT ABDOMEN AND PELVIS WITH CONTRAST TECHNIQUE: Multidetector CT imaging of the abdomen and pelvis was performed using the standard protocol following bolus administration of intravenous contrast. CONTRAST:  ISOVUE-300 IOPAMIDOL (ISOVUE-300) INJECTION 61% COMPARISON:  10/09/2018 CT abdomen and pelvis. FINDINGS: Lower chest: No acute abnormality. Hepatobiliary: Hepatic steatosis. No focal liver abnormality is seen. Status post cholecystectomy. No biliary dilatation. Pancreas: Unremarkable. No pancreatic ductal dilatation or surrounding inflammatory changes. Spleen: Normal in size without focal abnormality. Adrenals/Urinary Tract: Adrenal glands are unremarkable. Kidneys are normal, without renal calculi, focal lesion, or hydronephrosis. Bladder is unremarkable. Stomach/Bowel: Area of inflammation within the mesentery adjacent to sigmoid colon at the site of prior sigmoid diverticulitis. Within the mesentery there is a fistula tract/contained perforation containing foci of air (series 5 images 55, 60, 63). The structure may be contiguous with adjacent loops of small bowel which are contained within the inflamed mesentery. No additional obstructive or inflammatory changes of the bowel. Normal appendix. Vascular/Lymphatic: No significant vascular findings are present. No enlarged abdominal or pelvic lymph nodes. Reproductive: Prostatic calcifications. Other: Stable small right inguinal hernia containing fat. Stable small paraumbilical hernia containing fat. Musculoskeletal: Stable moderate chronic T12 compression deformity. Chronic anterior rib fractures bilaterally. IMPRESSION: 1. Pericolonic mesenteric inflammation at the site of prior sigmoid diverticulitis. Within the mesentery there is a fistula tract/contained perforation which may form a enterocolonic fistula with the adjacent  small bowel contained within the inflamed mesentery. 2. Additional stable chronic findings as above. Electronically Signed   By: Mitzi Hansen M.D.   On: 10/27/2018 21:28    Chart has been reviewed    Assessment/Plan  42 y.o. 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 Diverticulitis -failure of outpatient treatment patient with recurrent symptoms now CT scan showed enteric involvement.  Appreciate surgical consult.  Keep n.p.o. bowel rest.  Continue broad-spectrum antibiotics including Cipro and Flagyl may need definitive surgical management once active infectious process have improved . Essential hypertension -stable continue home medications when able to tolerate   Other plan as per orders.  DVT prophylaxis:  SCD    Code Status:  FULL CODE   as per patient   I had personally discussed CODE STATUS with patient and family   Family Communication:   Family   at  Bedside  plan of care was discussed with  Wife  Disposition Plan:    To home once workup is complete and patient is stable                    Consults called: general surgery  Admission status:   inpatient     Expect 2 midnight stay secondary to severity of patient's current illness including     Severe lab/radiological abnormalities including: Acute diverticulitis failed outpatient treatment        That are currently affecting medical management.  I expect  patient to be hospitalized for 2 midnights requiring inpatient medical care.  Patient  is at high risk for adverse outcome (such as loss of life or disability) if not treated.  Indication for inpatient stay as follows:    severe pain requiring acute inpatient management,  inability to maintain oral hydration    Need for operative/procedural  intervention Need for IV antibiotics, IV fluids , , IV  medications      Level of care      medical floor     Paul Gilmore 10/28/2018, 1:28 AM    [facility]  Pager [phone]   after 2 AM please page floor coverage PA If 7AM-7PM, please contact the day team taking care of the patient  [email or web address]  Password [email or web address]

## 2018-10-27 NOTE — ED Notes (Signed)
Patient transported to CT 

## 2018-10-27 NOTE — ED Notes (Signed)
ED TO INPATIENT HANDOFF REPORT  Name/Age/Gender Paul SpencePeter A Gilmore 42 y.o. male  Code Status Code Status History    Date Active Date Inactive Code Status Order ID Comments User Context   10/09/2018 2248 10/11/2018 1324 Full Code [id number]  Romie Leveehomas, Alicia, MD Inpatient   03/22/2018 1846 03/24/2018 1738 Full Code [id number]  Glenna FellowsHoxworth, Benjamin, MD Inpatient   03/03/2018 1247 03/15/2018 1447 Full Code [id number]  Matilde SprangJennings, Willard, PA-C ED      Home/SNF/Other Home  Chief Complaint Abd Pain  Level of Care/Admitting Diagnosis ED Disposition    ED Disposition Condition Comment   Admit  [facility] [[id number]]  Level of Care: Med-Surg [16]  Diagnosis: Diverticulitis [086578][[id number]]  Admitting Physician: Therisa DoyneUTOVA, ANASTASSIA [[id number]]  Attending Physician: Therisa DoyneUTOVA, ANASTASSIA [[id number]]  Estimated length of stay: 3 - 4 days  Certification:: I certify this patient will need inpatient services for at least 2 midnights  PT Class (Do Not Modify): Inpatient [101]  PT Acc Code (Do Not Modify): Private [1]       Medical History Past Medical History:  Diagnosis Date  . Allergy   . Anxiety   . Bowel obstruction (HCC)   . Diverticulitis of large intestine with perforation and abscess 02/2018  . GERD (gastroesophageal reflux disease)   . HTN (hypertension)     Allergies No Known Allergies  IV Location/Drains/Wounds Patient Lines/Drains/Airways Status   Active Line/Drains/Airways    Name:   Placement date:   Placement time:   Site:   Days:   Peripheral IV 10/27/18 Right Antecubital   10/27/18    1823    Antecubital   less than 1          Labs/Imaging Results for orders placed or performed during the hospital encounter of 10/27/18 (from the past 48 hour(s))  Urinalysis, Routine w reflex microscopic     Status: Abnormal   Collection Time: 10/27/18 12:30 PM  Result Value Ref Range   Color, Urine YELLOW YELLOW   APPearance CLEAR CLEAR   Specific Gravity, Urine 1.026  1.005 - 1.030   pH 5.0 5.0 - 8.0   Glucose, UA 150 (A) NEGATIVE mg/dL   Hgb urine dipstick NEGATIVE NEGATIVE   Bilirubin Urine NEGATIVE NEGATIVE   Ketones, ur NEGATIVE NEGATIVE mg/dL   Protein, ur NEGATIVE NEGATIVE mg/dL   Nitrite NEGATIVE NEGATIVE   Leukocytes, UA NEGATIVE NEGATIVE    Comment: Performed at [facility], [address] [place], [place] [address]  Lipase, blood     Status: None   Collection Time: 10/27/18 12:34 PM  Result Value Ref Range   Lipase 26 11 - 51 U/L    Comment: Performed at [facility], [address] [place], [place] [address]  Comprehensive metabolic panel     Status: Abnormal   Collection Time: 10/27/18 12:34 PM  Result Value Ref Range   Sodium 141 135 - 145 mmol/L   Potassium 3.9 3.5 - 5.1 mmol/L   Chloride 105 98 - 111 mmol/L   CO2 25 22 - 32 mmol/L   Glucose, Bld 134 (H) 70 - 99 mg/dL   BUN 17 6 - 20 mg/dL   Creatinine, Ser 1.320.97 0.61 - 1.24 mg/dL   Calcium 9.4 8.9 - 44.010.3 mg/dL   Total Protein 7.7 6.5 - 8.1 g/dL   Albumin 4.0 3.5 - 5.0 g/dL   AST 22 15 - 41 U/L   ALT 27 0 - 44 U/L   Alkaline Phosphatase 78 38 -  126 U/L   Total Bilirubin 0.5 0.3 - 1.2 mg/dL   GFR calc non Af Amer >60 >60 mL/min   GFR calc Af Amer >60 >60 mL/min   Anion gap 11 5 - 15    Comment: Performed at [facility], [address] [place], [place] [address]  CBC     Status: Abnormal   Collection Time: 10/27/18 12:34 PM  Result Value Ref Range   WBC 11.5 (H) 4.0 - 10.5 K/uL   RBC 5.37 4.22 - 5.81 MIL/uL   Hemoglobin 16.3 13.0 - 17.0 g/dL   HCT 52.8 41.3 - 24.4 %   MCV 91.4 80.0 - 100.0 fL   MCH 30.4 26.0 - 34.0 pg   MCHC 33.2 30.0 - 36.0 g/dL   RDW 01.0 27.2 - 53.6 %   Platelets 427 (H) 150 - 400 K/uL   nRBC 0.0 0.0 - 0.2 %    Comment: Performed at [facility], [address], [place], [place] [address]   Ct Abdomen Pelvis W Contrast  Result Date: 10/27/2018 CLINICAL DATA:  42 y/o  M; 2 days of left lower quadrant abdominal pain. Recent hospitalization for diverticulitis. EXAM: CT ABDOMEN AND PELVIS WITH CONTRAST TECHNIQUE: Multidetector CT imaging of the abdomen and pelvis was performed using the standard protocol following bolus administration of intravenous contrast. CONTRAST:  ISOVUE-300 IOPAMIDOL (ISOVUE-300) INJECTION 61% COMPARISON:  10/09/2018 CT abdomen and pelvis. FINDINGS: Lower chest: No acute abnormality. Hepatobiliary: Hepatic steatosis. No focal liver abnormality is seen. Status post cholecystectomy. No biliary dilatation. Pancreas: Unremarkable. No pancreatic ductal dilatation or surrounding inflammatory changes. Spleen: Normal in size without focal abnormality. Adrenals/Urinary Tract: Adrenal glands are unremarkable. Kidneys are normal, without renal calculi, focal lesion, or hydronephrosis. Bladder is unremarkable. Stomach/Bowel: Area of inflammation within the mesentery adjacent to sigmoid colon at the site of prior sigmoid diverticulitis. Within the mesentery there is a fistula tract/contained perforation containing foci of air (series 5 images 55, 60, 63). The structure may be contiguous with adjacent loops of small bowel which are contained within the inflamed mesentery. No additional obstructive or inflammatory changes of the bowel. Normal appendix. Vascular/Lymphatic: No significant vascular findings are present. No enlarged abdominal or pelvic lymph nodes. Reproductive: Prostatic calcifications. Other: Stable small right inguinal hernia containing fat. Stable small paraumbilical hernia containing fat. Musculoskeletal: Stable moderate chronic T12 compression deformity. Chronic anterior rib fractures bilaterally. IMPRESSION: 1. Pericolonic mesenteric inflammation at the site of prior sigmoid diverticulitis. Within the mesentery there is a fistula tract/contained perforation which may form a enterocolonic fistula with the adjacent small bowel contained within the  inflamed mesentery. 2. Additional stable chronic findings as above. Electronically Signed   By: Mitzi Hansen M.D.   On: 10/27/2018 21:28    Pending Labs [employer] (From admission, onward)    Start     Ordered   Signed and Held  Magnesium  Tomorrow morning,   R    Comments:  Call MD if <1.5    Signed and Held   Signed and Held  Phosphorus  Tomorrow morning,   R     Signed and Held   Signed and Held  TSH  Once,   R    Comments:  Cancel if already done within 1 month and notify MD    Signed and Held   Signed and Held  Comprehensive metabolic panel  Once,   R    Comments:  Cal MD for K<3.5 or >5.0  Signed and Held   Signed and Held  CBC  Once,   R    Comments:  Call for hg <8.0    Signed and Held          Vitals/Pain Today's Vitals   10/27/18 2117 10/27/18 2204 10/27/18 2331 10/27/18 2331  BP:    (!) 153/99  Pulse:    82  Resp:    18  Temp:      TempSrc:      SpO2:    94%  Weight:      Height:      PainSc: 9  6  9       Isolation Precautions No active isolations  Medications Medications  iopamidol (ISOVUE-300) 61 % injection (has no administration in time range)  sodium chloride (PF) 0.9 % injection (has no administration in time range)  ciprofloxacin (CIPRO) IVPB 400 mg (0 mg Intravenous Stopped 10/27/18 2333)    And  metroNIDAZOLE (FLAGYL) IVPB 500 mg (has no administration in time range)  HYDROmorphone (DILAUDID) injection 1 mg (1 mg Intravenous Given 10/27/18 1823)  ondansetron (ZOFRAN) injection 4 mg (4 mg Intravenous Given 10/27/18 1822)  sodium chloride 0.9 % bolus 1,000 mL (1,000 mLs Intravenous Bolus 10/27/18 1823)  iopamidol (ISOVUE-300) 61 % injection 100 mL (100 mLs Intravenous Contrast Given 10/27/18 2044)  HYDROmorphone (DILAUDID) injection 1 mg (1 mg Intravenous Given 10/27/18 2115)    Mobility walks

## 2018-10-27 NOTE — ED Triage Notes (Signed)
Patient c/o left lower quadrant pain x 2 days. Patient states he was recently hospitalized for diverticulitis.

## 2018-10-27 NOTE — ED Provider Notes (Signed)
[facility]-EMERGENCY DEPT Provider Note   CSN: [id number] Arrival date & time: 10/27/18  1201     History   Chief Complaint Chief Complaint  Patient presents with  . Abdominal Pain    HPI Paul Gilmore is a 42 y.o. male with history of GERD, hypertension, allergies, anxiety, and diverticulitis with perforation and abscess presents for evaluation of acute onset, progressively worsening left lower quadrant abdominal pain for 6 days.  Recently admitted for sigmoid diverticulitis with developing intramural abscess, discharged 10/11/2018 with a 2-week course of Augmentin.  No surgical intervention at that time.  Pain is constant, sharp, worsens with some movements.  Denies nausea, vomiting, fevers, chest pain, or shortness of breath.  Reports that he had a bowel movement earlier today which was constipated and did not have a bowel movement since last Wednesday (6 days ago) otherwise.  Has been altering his diet and taking his Augmentin and ibuprofen without significant relief of symptoms.  Reports he finished antibiotics 2 days ago.    The history is provided by the patient.    Past Medical History:  Diagnosis Date  . Allergy   . Anxiety   . Bowel obstruction (HCC)   . Diverticulitis of large intestine with perforation and abscess 02/2018  . GERD (gastroesophageal reflux disease)   . HTN (hypertension)     Patient Active Problem List   Diagnosis Date Noted  . Essential hypertension 10/27/2018  . Diverticulitis 10/09/2018  . Thoracic back pain 04/01/2018  . Diverticulitis of intestine with abscess 03/22/2018  . Obesity, Class III, BMI 40-49.9 (morbid obesity) (HCC) 03/08/2018  . Perforation of sigmoid colon due to diverticulitis 03/03/2018  . Hyperglycemia 03/03/2018  . Right foot pain 06/14/2017  . Great toe pain, left 03/04/2017  . Lower back injury 09/24/2016  . Hand laceration 09/24/2016  . Neck pain 02/08/2014    Past Surgical History:  Procedure  Laterality Date  . NO PAST SURGERIES          Home Medications    Prior to Admission medications   Medication Sig Start Date End Date Taking? Authorizing Provider  albuterol (PROVENTIL HFA;VENTOLIN HFA) 108 (90 Base) MCG/ACT inhaler Inhale 1 puff into the lungs every 6 (six) hours as needed for wheezing or shortness of breath.   Yes [provider]  amLODipine (NORVASC) 5 MG tablet Take 1 tablet (5 mg total) by mouth daily. 06/11/18  Yes Armbruster, Willaim RayasSteven P, MD  ibuprofen (ADVIL,MOTRIN) 200 MG tablet Take 400 mg by mouth every 6 (six) hours as needed for mild pain.    Yes [provider]  Probiotic Product (PROBIOTIC PO) Take 1 capsule by mouth daily.   Yes [provider]  psyllium (REGULOID) 0.52 g capsule Take 1.04 g by mouth every 4 (four) hours.   Yes [provider]  oxyCODONE (OXY IR/ROXICODONE) 5 MG immediate release tablet Take 1 tablet (5 mg total) by mouth every 6 (six) hours as needed for moderate pain. Patient not taking: Reported on 10/27/2018 10/11/18   Chevis Prettyoth, Paul III, MD  oxyCODONE (OXY IR/ROXICODONE) 5 MG immediate release tablet Take 1 tablet (5 mg total) by mouth every 6 (six) hours as needed for moderate pain, severe pain or breakthrough pain. Patient not taking: Reported on 10/27/2018 10/11/18   Glenna FellowsHoxworth, Benjamin, MD    Family History Family History  Problem Relation Age of Onset  . COPD Mother   . Diverticulitis Mother   . Alcohol abuse Father   . Hypertension  Father   . Throat cancer Father   . Esophageal cancer Father   . Colon cancer Neg Hx   . Rectal cancer Neg Hx   . Stomach cancer Neg Hx     Social History Social History   Tobacco Use  . Smoking status: Never Smoker  . Smokeless tobacco: Former Neurosurgeon    Types: Chew  Substance Use Topics  . Alcohol use: Yes    Alcohol/week: 0.0 standard drinks    Comment: moderate  . Drug use: Yes    Types: Marijuana     Allergies   Patient has no known  allergies.   Review of Systems Review of Systems  Constitutional: Negative for chills and fever.  Respiratory: Negative for shortness of breath.   Cardiovascular: Negative for chest pain.  Gastrointestinal: Positive for abdominal pain and constipation. Negative for blood in stool, nausea and vomiting.  Genitourinary: Negative for dysuria, frequency, hematuria and urgency.  All other systems reviewed and are negative.    Physical Exam Updated Vital Signs BP (!) 161/108 (BP Location: Left Arm)   Pulse 82   Temp (!) 97.4 F (36.3 C) (Oral)   Resp 18   Ht 5\' 11"  (1.803 m)   Wt 125.9 kg   SpO2 94%   BMI 38.71 kg/m   Physical Exam  Constitutional: He appears well-developed and well-nourished. No distress.  Appears uncomfortable  HENT:  Head: Normocephalic and atraumatic.  Eyes: Conjunctivae are normal. Right eye exhibits no discharge. Left eye exhibits no discharge.  Neck: No JVD present. No tracheal deviation present.  Cardiovascular: Normal rate and regular rhythm.  Pulmonary/Chest: Effort normal and breath sounds normal.  Abdominal: Soft. He exhibits no distension. Bowel sounds are decreased. There is tenderness in the right lower quadrant, suprapubic area and left lower quadrant. There is guarding. There is no rigidity, no rebound, no CVA tenderness, no tenderness at McBurney's point and negative Murphy's sign.  Musculoskeletal: He exhibits no edema.  Neurological: He is alert.  Skin: Skin is warm and dry. No erythema.  Psychiatric: He has a normal mood and affect. His behavior is normal.  Nursing note and vitals reviewed.    ED Treatments / Results  Labs (all labs ordered are listed, but only abnormal results are displayed) Labs Reviewed  COMPREHENSIVE METABOLIC PANEL - Abnormal; Notable for the following components:      Result Value   Glucose, Bld 134 (*)    All other components within normal limits  CBC - Abnormal; Notable for the following components:   WBC  11.5 (*)    Platelets 427 (*)    All other components within normal limits  URINALYSIS, ROUTINE W REFLEX MICROSCOPIC - Abnormal; Notable for the following components:   Glucose, UA 150 (*)    All other components within normal limits  LIPASE, BLOOD  MAGNESIUM  PHOSPHORUS  TSH  COMPREHENSIVE METABOLIC PANEL  CBC    EKG None  Radiology Ct Abdomen Pelvis W Contrast  Result Date: 10/27/2018 CLINICAL DATA:  42 y/o M; 2 days of left lower quadrant abdominal pain. Recent hospitalization for diverticulitis. EXAM: CT ABDOMEN AND PELVIS WITH CONTRAST TECHNIQUE: Multidetector CT imaging of the abdomen and pelvis was performed using the standard protocol following bolus administration of intravenous contrast. CONTRAST:  ISOVUE-300 IOPAMIDOL (ISOVUE-300) INJECTION 61% COMPARISON:  10/09/2018 CT abdomen and pelvis. FINDINGS: Lower chest: No acute abnormality. Hepatobiliary: Hepatic steatosis. No focal liver abnormality is seen. Status post cholecystectomy. No biliary dilatation. Pancreas: Unremarkable. No pancreatic ductal  dilatation or surrounding inflammatory changes. Spleen: Normal in size without focal abnormality. Adrenals/Urinary Tract: Adrenal glands are unremarkable. Kidneys are normal, without renal calculi, focal lesion, or hydronephrosis. Bladder is unremarkable. Stomach/Bowel: Area of inflammation within the mesentery adjacent to sigmoid colon at the site of prior sigmoid diverticulitis. Within the mesentery there is a fistula tract/contained perforation containing foci of air (series 5 images 55, 60, 63). The structure may be contiguous with adjacent loops of small bowel which are contained within the inflamed mesentery. No additional obstructive or inflammatory changes of the bowel. Normal appendix. Vascular/Lymphatic: No significant vascular findings are present. No enlarged abdominal or pelvic lymph nodes. Reproductive: Prostatic calcifications. Other: Stable small right inguinal hernia  containing fat. Stable small paraumbilical hernia containing fat. Musculoskeletal: Stable moderate chronic T12 compression deformity. Chronic anterior rib fractures bilaterally. IMPRESSION: 1. Pericolonic mesenteric inflammation at the site of prior sigmoid diverticulitis. 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 2. Additional stable chronic findings as above. Electronically Signed   By: Mitzi Hansen M.D.   On: 10/27/2018 21:28    Procedures Procedures (including critical care time)  Medications Ordered in ED Medications  iopamidol (ISOVUE-300) 61 % injection (has no administration in time range)  sodium chloride (PF) 0.9 % injection (has no administration in time range)  ciprofloxacin (CIPRO) IVPB 400 mg (0 mg Intravenous Stopped 10/27/18 2333)    And  metroNIDAZOLE (FLAGYL) IVPB 500 mg (has no administration in time range)  HYDROmorphone (DILAUDID) injection 1 mg (has no administration in time range)  acetaminophen (TYLENOL) tablet 650 mg (has no administration in time range)    Or  acetaminophen (TYLENOL) suppository 650 mg (has no administration in time range)  ondansetron (ZOFRAN) tablet 4 mg (has no administration in time range)    Or  ondansetron (ZOFRAN) injection 4 mg (has no administration in time range)  0.9 %  sodium chloride infusion (has no administration in time range)  metroNIDAZOLE (FLAGYL) IVPB 500 mg (has no administration in time range)  HYDROmorphone (DILAUDID) injection 1 mg (1 mg Intravenous Given 10/27/18 1823)  ondansetron (ZOFRAN) injection 4 mg (4 mg Intravenous Given 10/27/18 1822)  sodium chloride 0.9 % bolus 1,000 mL (1,000 mLs Intravenous Bolus 10/27/18 1823)  iopamidol (ISOVUE-300) 61 % injection 100 mL (100 mLs Intravenous Contrast Given 10/27/18 2044)  HYDROmorphone (DILAUDID) injection 1 mg (1 mg Intravenous Given  10/27/18 2115)     Initial Impression / Assessment and Plan / ED Course  I have reviewed the triage vital signs and the nursing notes.  Pertinent labs & imaging results that were available during my care of the patient were reviewed by me and considered in my medical decision making (see chart for details).    Patient with history of recent admission for diverticulitis presents for evaluation of 5-day history of left lower quadrant abdominal pain.  He is afebrile, somewhat hypertensive in the ED.  He appears quite uncomfortable.  Lab work significant for leukocytosis.  UA does not suggest UTI or nephrolithiasis.  CT scan of the abdomen and pelvis shows pericolonic mesenteric inflammation at the site of prior sigmoid diverticulitis.  Within the mesentery there is a fistula tract/contained perforation which may form an enteric colonic fistula with the adjacent small bowel.  Spoke with Dr. Carolynne Edouard with general surgery who recommends hospitalist admission and IV antibiotics.  They will see the patient in consultation while in the hospital.  Spoke with Dr. Adela Glimpse with [facility] hospitalist service who agrees to assume care of patient and bring him into the hospital for further evaluation and management.  He was given IV Cipro and Flagyl in the ED.  Final Clinical Impressions(s) / ED Diagnoses   Final diagnoses:  Diverticulitis of colon    ED Discharge Orders    None       Jeanie Sewer, PA-C 10/28/18 0005    Loren Racer, MD 10/30/18 [phone]

## 2018-10-27 NOTE — Consult Note (Signed)
Reason for Consult:abd pain Referring Physician: Dr. [name] is an 42 y.o. male.  HPI: The patient is a 42 year old white male who was recently admitted to the hospital with sigmoid diverticulitis.  He got better on antibiotics and went home on 2 weeks of oral antibiotics.  He finished up his oral therapy the couple of days ago.  Since that time he has had recurrence of his left lower quadrant pain.  He denies any significant fevers or chills.  He came back to the emergency department where a CT scan shows recurrent inflammation around the sigmoid colon but no evidence of abscess.  Past Medical History:  Diagnosis Date  . Allergy   . Anxiety   . Bowel obstruction (HCC)   . Diverticulitis of large intestine with perforation and abscess 02/2018  . GERD (gastroesophageal reflux disease)   . HTN (hypertension)     Past Surgical History:  Procedure Laterality Date  . NO PAST SURGERIES      Family History  Problem Relation Age of Onset  . COPD Mother   . Diverticulitis Mother   . Alcohol abuse Father   . Hypertension Father   . Throat cancer Father   . Esophageal cancer Father   . Colon cancer Neg Hx   . Rectal cancer Neg Hx   . Stomach cancer Neg Hx     Social History:  reports that he has never smoked. He quit smokeless tobacco use about 4 years ago.  His smokeless tobacco use included chew. He reports that he drinks alcohol. He reports that he has current or past drug history. Drug: Marijuana.  Allergies: No Known Allergies  Medications: I have reviewed the patient's current medications.  Results for orders placed or performed during the hospital encounter of 10/27/18 (from the past 48 hour(s))  Urinalysis, Routine w reflex microscopic     Status: Abnormal   Collection Time: 10/27/18 12:30 PM  Result Value Ref Range   Color, Urine YELLOW YELLOW   APPearance CLEAR CLEAR   Specific Gravity, Urine 1.026 1.005 - 1.030   pH 5.0 5.0 - 8.0   Glucose, UA 150 (A)  NEGATIVE mg/dL   Hgb urine dipstick NEGATIVE NEGATIVE   Bilirubin Urine NEGATIVE NEGATIVE   Ketones, ur NEGATIVE NEGATIVE mg/dL   Protein, ur NEGATIVE NEGATIVE mg/dL   Nitrite NEGATIVE NEGATIVE   Leukocytes, UA NEGATIVE NEGATIVE    Comment: Performed at [facility], [address] [place], [place] [address]  Lipase, blood     Status: None   Collection Time: 10/27/18 12:34 PM  Result Value Ref Range   Lipase 26 11 - 51 U/L    Comment: Performed at [facility], [address], [place], [place] [address]  Comprehensive metabolic panel     Status: Abnormal   Collection Time: 10/27/18 12:34 PM  Result Value Ref Range   Sodium 141 135 - 145 mmol/L   Potassium 3.9 3.5 - 5.1 mmol/L   Chloride 105 98 - 111 mmol/L   CO2 25 22 - 32 mmol/L   Glucose, Bld 134 (H) 70 - 99 mg/dL   BUN 17 6 - 20 mg/dL   Creatinine, Ser 3.01 0.61 - 1.24 mg/dL   Calcium 9.4 8.9 - 60.1 mg/dL   Total Protein 7.7 6.5 - 8.1 g/dL   Albumin 4.0 3.5 - 5.0 g/dL   AST 22 15 - 41 U/L   ALT 27 0 - 44 U/L   Alkaline Phosphatase  78 38 - 126 U/L   Total Bilirubin 0.5 0.3 - 1.2 mg/dL   GFR calc non Af Amer >60 >60 mL/min   GFR calc Af Amer >60 >60 mL/min   Anion gap 11 5 - 15    Comment: Performed at [facility], [address] [place], [place] [address]  CBC     Status: Abnormal   Collection Time: 10/27/18 12:34 PM  Result Value Ref Range   WBC 11.5 (H) 4.0 - 10.5 K/uL   RBC 5.37 4.22 - 5.81 MIL/uL   Hemoglobin 16.3 13.0 - 17.0 g/dL   HCT 27.249.1 53.639.0 - 64.452.0 %   MCV 91.4 80.0 - 100.0 fL   MCH 30.4 26.0 - 34.0 pg   MCHC 33.2 30.0 - 36.0 g/dL   RDW 03.412.1 74.211.5 - 59.515.5 %   Platelets 427 (H) 150 - 400 K/uL   nRBC 0.0 0.0 - 0.2 %    Comment: Performed at [facility], [address] [place], [place] [address]    Ct Abdomen Pelvis W Contrast  Result Date: 10/27/2018 CLINICAL DATA:  42 y/o M; 2 days of left lower quadrant abdominal pain. Recent  hospitalization for diverticulitis. EXAM: CT ABDOMEN AND PELVIS WITH CONTRAST TECHNIQUE: Multidetector CT imaging of the abdomen and pelvis was performed using the standard protocol following bolus administration of intravenous contrast. CONTRAST:  100mL ISOVUE-300 IOPAMIDOL (ISOVUE-300) INJECTION 61% COMPARISON:  10/09/2018 CT abdomen and pelvis. FINDINGS: Lower chest: No acute abnormality. Hepatobiliary: Hepatic steatosis. No focal liver abnormality is seen. Status post cholecystectomy. No biliary dilatation. Pancreas: Unremarkable. No pancreatic ductal dilatation or surrounding inflammatory changes. Spleen: Normal in size without focal abnormality. Adrenals/Urinary Tract: Adrenal glands are unremarkable. Kidneys are normal, without renal calculi, focal lesion, or hydronephrosis. Bladder is unremarkable. Stomach/Bowel: Area of inflammation within the mesentery adjacent to sigmoid colon at the site of prior sigmoid diverticulitis. Within the mesentery there is a fistula tract/contained perforation containing foci of air (series 5 images 55, 60, 63). The structure may be contiguous with adjacent loops of small bowel which are contained within the inflamed mesentery. No additional obstructive or inflammatory changes of the bowel. Normal appendix. Vascular/Lymphatic: No significant vascular findings are present. No enlarged abdominal or pelvic lymph nodes. Reproductive: Prostatic calcifications. Other: Stable small right inguinal hernia containing fat. Stable small paraumbilical hernia containing fat. Musculoskeletal: Stable moderate chronic T12 compression deformity. Chronic anterior rib fractures bilaterally. IMPRESSION: 1. Pericolonic mesenteric inflammation at the site of prior sigmoid diverticulitis. 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 2. Additional stable chronic findings  as above. Electronically Signed   By: Mitzi HansenLance  Furusawa-Stratton M.D.   On: 10/27/2018 21:28    Review of Systems  Constitutional: Negative.   HENT: Negative.   Eyes: Negative.   Respiratory: Negative.   Cardiovascular: Negative.   Gastrointestinal: Positive for abdominal pain. Negative for nausea and vomiting.  Genitourinary: Negative.   Musculoskeletal: Negative.   Skin: Negative.   Neurological: Negative.   Endo/Heme/Allergies: Negative.    Blood pressure 139/70, pulse 71, temperature 97.9 F (36.6 C), temperature source Oral, resp. rate 18, height 6' (1.829 m), weight 129.3 kg, SpO2 96 %. Physical Exam  Constitutional: He is oriented to person, place, and time. He appears well-developed and well-nourished. No distress.  HENT:  Head: Normocephalic and atraumatic.  Mouth/Throat: No oropharyngeal exudate.  Eyes: Pupils are equal, round, and reactive  to light. Conjunctivae and EOM are normal.  Neck: Normal range of motion. Neck supple.  Cardiovascular: Normal rate, regular rhythm and normal heart sounds.  Respiratory: Effort normal and breath sounds normal. No stridor. No respiratory distress.  GI: Soft. Bowel sounds are normal. He exhibits no distension. There is tenderness.  Musculoskeletal: Normal range of motion. He exhibits no edema or tenderness.  Neurological: He is alert and oriented to person, place, and time. Coordination normal.  Skin: Skin is warm and dry. No erythema.  Psychiatric: He has a normal mood and affect. His behavior is normal. Thought content normal.    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  At this point I think there is a good chance that he will improve with IV antibiotics and bowel rest just as he did before.  If he does then he will have a chance of potentially staying on oral antibiotics and in 6 to 8 weeks having an elective operation to remove the sigmoid colon and hopefully avoid a  colostomy.  If he does not improve he may need a more emergent surgery that would probably require a colostomy.  Paul Gilmore 10/27/2018, 10:05 PM

## 2018-10-28 LAB — COMPREHENSIVE METABOLIC PANEL
ALT: 24 U/L (ref 0–44)
AST: 22 U/L (ref 15–41)
Albumin: 3.6 g/dL (ref 3.5–5.0)
Alkaline Phosphatase: 73 U/L (ref 38–126)
Anion gap: 10 (ref 5–15)
BILIRUBIN TOTAL: 0.8 mg/dL (ref 0.3–1.2)
BUN: 18 mg/dL (ref 6–20)
CALCIUM: 8.8 mg/dL — AB (ref 8.9–10.3)
CO2: 26 mmol/L (ref 22–32)
CREATININE: 0.98 mg/dL (ref 0.61–1.24)
Chloride: 104 mmol/L (ref 98–111)
GFR calc Af Amer: >60 mL/min (ref >60)
GFR calc non Af Amer: >60 mL/min (ref >60)
Glucose, Bld: 93 mg/dL (ref 70–99)
Potassium: 4.5 mmol/L (ref 3.5–5.1)
Sodium: 140 mmol/L (ref 135–145)
TOTAL PROTEIN: 6.9 g/dL (ref 6.5–8.1)

## 2018-10-28 LAB — MAGNESIUM: Magnesium: 2.1 mg/dL (ref 1.7–2.4)

## 2018-10-28 LAB — CBC
HEMATOCRIT: 45.5 % (ref 39.0–52.0)
Hemoglobin: 15.5 g/dL (ref 13.0–17.0)
MCH: 30.6 pg (ref 26.0–34.0)
MCHC: 34.1 g/dL (ref 30.0–36.0)
MCV: 89.7 fL (ref 80.0–100.0)
Platelets: 361 10*3/uL (ref 150–400)
RBC: 5.07 MIL/uL (ref 4.22–5.81)
RDW: 12.1 % (ref 11.5–15.5)
WBC: 10.7 10*3/uL — ABNORMAL HIGH (ref 4.0–10.5)
nRBC: 0 % (ref 0.0–0.2)

## 2018-10-28 LAB — PHOSPHORUS: Phosphorus: 3.5 mg/dL (ref 2.5–4.6)

## 2018-10-28 LAB — TSH: TSH: 1.785 u[IU]/mL (ref 0.350–4.500)

## 2018-10-28 MED ORDER — CIPROFLOXACIN IN D5W 400 MG/200ML IV SOLN
400.0000 mg | Freq: Two times a day (BID) | INTRAVENOUS | Status: DC
  Filled 2018-10-28: qty 200

## 2018-10-28 MED ORDER — HYDRALAZINE HCL 20 MG/ML IJ SOLN: 10.0000 mg | Freq: Four times a day (QID) | INTRAMUSCULAR | Status: DC | PRN

## 2018-10-28 MED ORDER — PIPERACILLIN-TAZOBACTAM 3.375 G IVPB
3.3750 g | Freq: Three times a day (TID) | INTRAVENOUS | Status: DC
  Administered 2018-10-28 – 2018-10-30 (×6): 3.375 g via INTRAVENOUS
  Filled 2018-10-28 (×8): qty 50

## 2018-10-28 MED ORDER — LIP MEDEX EX OINT
TOPICAL_OINTMENT | CUTANEOUS | Status: AC
  Administered 2018-10-28: 10:00:00
  Filled 2018-10-28: qty 7

## 2018-10-28 MED ORDER — HYDROMORPHONE HCL 1 MG/ML IJ SOLN
1.0000 mg | INTRAMUSCULAR | Status: DC | PRN
  Administered 2018-10-28: 1 mg via INTRAVENOUS
  Administered 2018-10-28 (×2): 2 mg via INTRAVENOUS
  Administered 2018-10-28: 1 mg via INTRAVENOUS
  Administered 2018-10-28 – 2018-10-30 (×9): 2 mg via INTRAVENOUS
  Filled 2018-10-28 (×2): qty 2
  Filled 2018-10-28: qty 1
  Filled 2018-10-28 (×8): qty 2
  Filled 2018-10-28 (×2): qty 1
  Filled 2018-10-28: qty 2

## 2018-10-28 NOTE — Progress Notes (Signed)
Pharmacy Antibiotic Note  Delila Spenceeter A Hubers is a 42 y.o. male admitted on 10/27/2018 with Intra-abdominal infection.  Pharmacy has been consulted for Ciprofloxacin dosing.  Plan: Ciprofloxacin 400mg  iv q12hr  Height: 5\' 11"  (180.3 cm) Weight: 277 lb 9 oz (125.9 kg) IBW/kg (Calculated) : 75.3  Temp (24hrs), Avg:97.7 F (36.5 C), Min:97.4 F (36.3 C), Max:97.9 F (36.6 C)  Recent Labs  Lab 10/27/18 1234  WBC 11.5*  CREATININE 0.97    Estimated Creatinine Clearance: 132.6 mL/min (by C-G formula based on SCr of 0.97 mg/dL).    No Known Allergies  Antimicrobials this admission: Ciprofloxacin 10/27/2018 >> Flagyl 10/27/2018 >>   Dose adjustments this admission: -  Microbiology results: -  Thank you for allowing pharmacy to be a part of this patient's care.  Aleene DavidsonGrimsley Jr, Rozina Pointer Crowford 10/28/2018 4:34 AM

## 2018-10-28 NOTE — Progress Notes (Signed)
[facility] PROGRESS NOTE  Paul Gilmore QMV:[id number] DOB: 04-25-1976 DOA: 10/27/2018  PCP: Patient, No Pcp Per  Brief History/Interval Summary: 42 year old Caucasian male with a past medical history of GERD essential hypertension previous history of diverticulitis presented with abdominal pain.  Found to have changes suggestive of sigmoid diverticulitis on CT scan and was hospitalized for further management.  He was recently in the hospital for same.  Reason for Visit: Recurrent acute sigmoid diverticulitis  Consultants: General surgery  Procedures: None  Antibiotics: Currently on IV Zosyn  Subjective/Interval History: Patient states that he is feeling better however continues to have pain in the left lower part of his abdomen.  8 out of 10 in intensity.  No nausea vomiting.  Had bowel movement yesterday but none today so far.  Passing gas.  ROS: Denies any shortness of breath  Objective:  Vital Signs  Vitals:   10/27/18 2000 10/27/18 2331 10/27/18 2351 10/28/18 0635  BP: 139/70 (!) 153/99 (!) 161/108 136/78  Pulse: 71 82 82 79  Resp: 18 18 18 17   Temp:   (!) 97.4 F (36.3 C) 97.9 F (36.6 C)  TempSrc:   Oral Oral  SpO2: 96% 94% 94% 96%  Weight:   125.9 kg   Height:   5\' 11"  (1.803 m)     Intake/Output Summary (Last 24 hours) at 10/28/2018 1205 Last data filed at 10/28/2018 0421 Gross per 24 hour  Intake 604.7 ml  Output -  Net 604.7 ml   Filed Weights   10/27/18 1218 10/27/18 2351  Weight: 129.3 kg 125.9 kg    General appearance: alert, cooperative, appears stated age and no distress Head: Normocephalic, without obvious abnormality, atraumatic Resp: clear to auscultation bilaterally Cardio: regular rate and rhythm, S1, S2 normal, no murmur, click, rub or gallop GI: Abdomen soft.  Tenderness in the left lower quadrant without any rebound rigidity or guarding.  No masses organomegaly Extremities: extremities normal, atraumatic, no cyanosis or  edema Neurologic: Patient is awake alert.  Oriented x3.  No focal neurological deficits.  Lab Results:  Data Reviewed: I have personally reviewed following labs and imaging studies  CBC: Recent Labs  Lab 10/27/18 1234 10/28/18 0529  WBC 11.5* 10.7*  HGB 16.3 15.5  HCT 49.1 45.5  MCV 91.4 89.7  PLT 427* 361    Basic Metabolic Panel: Recent Labs  Lab 10/27/18 1234 10/28/18 0529  NA 141 140  K 3.9 4.5  CL 105 104  CO2 25 26  GLUCOSE 134* 93  BUN 17 18  CREATININE 0.97 0.98  CALCIUM 9.4 8.8*  MG  --  2.1  PHOS  --  3.5    GFR: Estimated Creatinine Clearance: 131.3 mL/min (by C-G formula based on SCr of 0.98 mg/dL).  Liver Function Tests: Recent Labs  Lab 10/27/18 1234 10/28/18 0529  AST 22 22  ALT 27 24  ALKPHOS 78 73  BILITOT 0.5 0.8  PROT 7.7 6.9  ALBUMIN 4.0 3.6    Recent Labs  Lab 10/27/18 1234  LIPASE 26    Thyroid Function Tests: Recent Labs    10/28/18 0806  TSH 1.785      Radiology Studies: Ct Abdomen Pelvis W Contrast  Result Date: 10/27/2018 CLINICAL DATA:  42 y/o M; 2 days of left lower quadrant abdominal pain. Recent hospitalization for diverticulitis. EXAM: CT ABDOMEN AND PELVIS WITH CONTRAST TECHNIQUE: Multidetector CT imaging of the abdomen and pelvis was performed using the standard protocol following bolus administration of intravenous contrast. CONTRAST:  100mL ISOVUE-300 IOPAMIDOL (ISOVUE-300) INJECTION 61% COMPARISON:  10/09/2018 CT abdomen and pelvis. FINDINGS: Lower chest: No acute abnormality. Hepatobiliary: Hepatic steatosis. No focal liver abnormality is seen. Status post cholecystectomy. No biliary dilatation. Pancreas: Unremarkable. No pancreatic ductal dilatation or surrounding inflammatory changes. Spleen: Normal in size without focal abnormality. Adrenals/Urinary Tract: Adrenal glands are unremarkable. Kidneys are normal, without renal calculi, focal lesion, or hydronephrosis. Bladder is unremarkable. Stomach/Bowel: Area  of inflammation within the mesentery adjacent to sigmoid colon at the site of prior sigmoid diverticulitis. Within the mesentery there is a fistula tract/contained perforation containing foci of air (series 5 images 55, 60, 63). The structure may be contiguous with adjacent loops of small bowel which are contained within the inflamed mesentery. No additional obstructive or inflammatory changes of the bowel. Normal appendix. Vascular/Lymphatic: No significant vascular findings are present. No enlarged abdominal or pelvic lymph nodes. Reproductive: Prostatic calcifications. Other: Stable small right inguinal hernia containing fat. Stable small paraumbilical hernia containing fat. Musculoskeletal: Stable moderate chronic T12 compression deformity. Chronic anterior rib fractures bilaterally. IMPRESSION: 1. Pericolonic mesenteric inflammation at the site of prior sigmoid diverticulitis. 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 2. Additional stable chronic findings as above. Electronically Signed   By: Paul HansenLance  Gilmore M.D.   On: 10/27/2018 21:28     Medications:  Scheduled: . lip balm       Continuous: . piperacillin-tazobactam (ZOSYN)  IV 3.375 g (10/28/18 0958)   EAV:[id number]:acetaminophen **OR** acetaminophen, HYDROmorphone (DILAUDID) injection, ondansetron **OR** ondansetron (ZOFRAN) IV    Assessment/Plan:  Recurrent acute sigmoid diverticulitis with concern for pericolonic mesenteric inflammation contained perforation and possible fistula General surgery is following.  Patient is on Zosyn which will be continued.  Adjust pain medication as pain is not adequately controlled with current regimen.  N.p.o. for now.  Patient will likely need surgical intervention in the future.  Essential hypertension Continue to monitor blood pressures.  Amlodipine on hold.   Hydralazine as needed.  DVT Prophylaxis: SCDs    Code Status: Full code Family Communication: Discussed with the patient Disposition Plan: Mobilize as tolerated.  Management as outlined above.    LOS: 1 day   Paul ShipperGokul Breklyn Fabrizio  [facility] Pager [phone] 10/28/2018, 12:05 PM  If 7PM-7AM, please contact night-coverage at [email or web address], password [email or web address]

## 2018-10-28 NOTE — Progress Notes (Signed)
Patient ID: Paul Gilmore, male   DOB: 10/07/1976, 42 y.o.   MRN: [id number]       Subjective: Patient hasn't had pain meds in about 3 hrs and pain is about an 8 right now.  Down from 9-10 overnight.  Can't really tell that he feels much different at this point.  Had a good BM yesterday.  Passing some flatus  Objective: Vital signs in last 24 hours: Temp:  [97.4 F (36.3 C)-97.9 F (36.6 C)] 97.9 F (36.6 C) (12/03 0635) Pulse Rate:  [71-102] 79 (12/03 0635) Resp:  [16-18] 17 (12/03 0635) BP: (135-161)/(70-108) 136/78 (12/03 0635) SpO2:  [94 %-100 %] 96 % (12/03 0635) Weight:  [125.9 kg-129.3 kg] 125.9 kg (12/02 2351) Last BM Date: 10/27/18  Intake/Output from previous day: 12/02 0701 - 12/03 0700 In: 604.7 [I.V.:304.7; IV Piggyback:300] Out: -  Intake/Output this shift: No intake/output data recorded.  PE: Heart: regular Lungs: CTAB Abd: soft, obese, +BS, still tender with some voluntary guarding in LLQ and suprapubic area.  No peritonitis.  Lab Results:  Recent Labs    10/27/18 1234 10/28/18 0529  WBC 11.5* 10.7*  HGB 16.3 15.5  HCT 49.1 45.5  PLT 427* 361   BMET Recent Labs    10/27/18 1234 10/28/18 0529  NA 141 140  K 3.9 4.5  CL 105 104  CO2 25 26  GLUCOSE 134* 93  BUN 17 18  CREATININE 0.97 0.98  CALCIUM 9.4 8.8*   PT/INR No results for input(s): LABPROT, INR in the last 72 hours. CMP     Component Value Date/Time   NA 140 10/28/2018 0529   K 4.5 10/28/2018 0529   CL 104 10/28/2018 0529   CO2 26 10/28/2018 0529   GLUCOSE 93 10/28/2018 0529   BUN 18 10/28/2018 0529   CREATININE 0.98 10/28/2018 0529   CALCIUM 8.8 (L) 10/28/2018 0529   PROT 6.9 10/28/2018 0529   ALBUMIN 3.6 10/28/2018 0529   AST 22 10/28/2018 0529   ALT 24 10/28/2018 0529   ALKPHOS 73 10/28/2018 0529   BILITOT 0.8 10/28/2018 0529   GFRNONAA >60 10/28/2018 0529   GFRAA >60 10/28/2018 0529   Lipase     Component Value Date/Time   LIPASE 26 10/27/2018 1234        Studies/Results: Ct Abdomen Pelvis W Contrast  Result Date: 10/27/2018 CLINICAL DATA:  42 y/o M; 2 days of left lower quadrant abdominal pain. Recent hospitalization for diverticulitis. EXAM: CT ABDOMEN AND PELVIS WITH CONTRAST TECHNIQUE: Multidetector CT imaging of the abdomen and pelvis was performed using the standard protocol following bolus administration of intravenous contrast. CONTRAST:  100mL ISOVUE-300 IOPAMIDOL (ISOVUE-300) INJECTION 61% COMPARISON:  10/09/2018 CT abdomen and pelvis. FINDINGS: Lower chest: No acute abnormality. Hepatobiliary: Hepatic steatosis. No focal liver abnormality is seen. Status post cholecystectomy. No biliary dilatation. Pancreas: Unremarkable. No pancreatic ductal dilatation or surrounding inflammatory changes. Spleen: Normal in size without focal abnormality. Adrenals/Urinary Tract: Adrenal glands are unremarkable. Kidneys are normal, without renal calculi, focal lesion, or hydronephrosis. Bladder is unremarkable. Stomach/Bowel: Area of inflammation within the mesentery adjacent to sigmoid colon at the site of prior sigmoid diverticulitis. Within the mesentery there is a fistula tract/contained perforation containing foci of air (series 5 images 55, 60, 63). The structure may be contiguous with adjacent loops of small bowel which are contained within the inflamed mesentery. No additional obstructive or inflammatory changes of the bowel. Normal appendix. Vascular/Lymphatic: No significant vascular findings are present. No enlarged abdominal or pelvic  lymph nodes. Reproductive: Prostatic calcifications. Other: Stable small right inguinal hernia containing fat. Stable small paraumbilical hernia containing fat. Musculoskeletal: Stable moderate chronic T12 compression deformity. Chronic anterior rib fractures bilaterally. IMPRESSION: 1. Pericolonic mesenteric inflammation at the site of prior sigmoid diverticulitis. Within the mesentery there is a fistula  tract/contained perforation which may form a enterocolonic fistula with the adjacent small bowel contained within the inflamed mesentery. 2. Additional stable chronic findings as above. Electronically Signed   By: Mitzi Hansen M.D.   On: 10/27/2018 21:28    Anti-infectives: Anti-infectives (From admission, onward)   Start     Dose/Rate Route Frequency Ordered Stop   10/28/18 1000  ciprofloxacin (CIPRO) IVPB 400 mg  Status:  Discontinued     400 mg 200 mL/hr over 60 Minutes Intravenous Every 12 hours 10/28/18 0434 10/28/18 0947   10/28/18 1000  piperacillin-tazobactam (ZOSYN) IVPB 3.375 g     3.375 g 12.5 mL/hr over 240 Minutes Intravenous Every 8 hours 10/28/18 0947     10/28/18 0800  metroNIDAZOLE (FLAGYL) IVPB 500 mg  Status:  Discontinued     500 mg 100 mL/hr over 60 Minutes Intravenous Every 8 hours 10/27/18 2355 10/28/18 0947   10/27/18 2200  ciprofloxacin (CIPRO) IVPB 400 mg     400 mg 200 mL/hr over 60 Minutes Intravenous  Once 10/27/18 2145 10/27/18 2333   10/27/18 2200  metroNIDAZOLE (FLAGYL) IVPB 500 mg     500 mg 100 mL/hr over 60 Minutes Intravenous  Once 10/27/18 2145 10/28/18 0119       Assessment/Plan 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  he was treated again several weeks ago and discharged on augmentin.  He redeveloped some pain even while on the augmentin, but worsened this past weekend when the Rx ran out.  New CT shows persistent inflammatory changes.  Given persistent inflammation, etc, will change to zosyn instead of Cipro/Flagyl as it doesn't have the best coverage for complicated, refractory diverticulitis.   -keep NPO today x ice chips and sips given pain still present requiring pain meds -mobilize and pulm toilet   FEN - IVFs,NPO x ice chips and sips of liquids VTE -  SCDs/ ok for chemical prophylaxis from our standpoint ID - Cipro/Flagyl 12/3-->12/3, zosyn 12/3 -->   LOS: 1 day     Letha Cape , [facility] Surgery 10/28/2018, 9:49 AM Pager: [phone]

## 2018-10-29 DIAGNOSIS — K5792 Diverticulitis of intestine, part unspecified, without perforation or abscess without bleeding: Secondary | ICD-10-CM

## 2018-10-29 DIAGNOSIS — E669 Obesity, unspecified: Secondary | ICD-10-CM

## 2018-10-29 LAB — CBC
HCT: 46.2 % (ref 39.0–52.0)
Hemoglobin: 15.1 g/dL (ref 13.0–17.0)
MCH: 30.6 pg (ref 26.0–34.0)
MCHC: 32.7 g/dL (ref 30.0–36.0)
MCV: 93.5 fL (ref 80.0–100.0)
Platelets: 351 10*3/uL (ref 150–400)
RBC: 4.94 MIL/uL (ref 4.22–5.81)
RDW: 12 % (ref 11.5–15.5)
WBC: 9.4 10*3/uL (ref 4.0–10.5)
nRBC: 0 % (ref 0.0–0.2)

## 2018-10-29 LAB — BASIC METABOLIC PANEL
Anion gap: 8 (ref 5–15)
BUN: 14 mg/dL (ref 6–20)
CO2: 27 mmol/L (ref 22–32)
Calcium: 8.9 mg/dL (ref 8.9–10.3)
Chloride: 103 mmol/L (ref 98–111)
Creatinine, Ser: 0.98 mg/dL (ref 0.61–1.24)
GFR calc Af Amer: >60 mL/min (ref >60)
GLUCOSE: 105 mg/dL — AB (ref 70–99)
POTASSIUM: 4.3 mmol/L (ref 3.5–5.1)
Sodium: 138 mmol/L (ref 135–145)

## 2018-10-29 MED ORDER — HEPARIN SODIUM (PORCINE) 5000 UNIT/ML IJ SOLN
5000.0000 [IU] | Freq: Three times a day (TID) | INTRAMUSCULAR | Status: DC
  Administered 2018-10-29 – 2018-10-30 (×3): 5000 [IU] via SUBCUTANEOUS
  Filled 2018-10-29 (×3): qty 1

## 2018-10-29 MED ORDER — POLYETHYLENE GLYCOL 3350 17 G PO PACK
17.0000 g | PACK | Freq: Two times a day (BID) | ORAL | Status: DC
  Administered 2018-10-29 – 2018-10-30 (×2): 17 g via ORAL
  Filled 2018-10-29 (×2): qty 1

## 2018-10-29 MED ORDER — BISACODYL 10 MG RE SUPP: 10.0000 mg | Freq: Every day | RECTAL | Status: DC | PRN

## 2018-10-29 MED ORDER — OXYCODONE HCL 5 MG PO TABS
5.0000 mg | ORAL_TABLET | Freq: Four times a day (QID) | ORAL | Status: DC | PRN
  Administered 2018-10-29 – 2018-10-30 (×3): 5 mg via ORAL
  Filled 2018-10-29 (×3): qty 1

## 2018-10-29 MED ORDER — AMLODIPINE BESYLATE 5 MG PO TABS
5.0000 mg | ORAL_TABLET | Freq: Every day | ORAL | Status: DC
  Administered 2018-10-29 – 2018-10-30 (×2): 5 mg via ORAL
  Filled 2018-10-29 (×2): qty 1

## 2018-10-29 MED ORDER — SENNOSIDES-DOCUSATE SODIUM 8.6-50 MG PO TABS
1.0000 | ORAL_TABLET | Freq: Two times a day (BID) | ORAL | Status: DC
  Administered 2018-10-29 – 2018-10-30 (×2): 1 via ORAL
  Filled 2018-10-29 (×2): qty 1

## 2018-10-29 NOTE — Plan of Care (Signed)
  Problem: Nutrition: Goal: Adequate nutrition will be maintained Outcome: Progressing   Problem: Pain Managment: Goal: General experience of comfort will improve Outcome: Progressing   Problem: Elimination: Goal: Will not experience complications related to bowel motility Outcome: Progressing   

## 2018-10-29 NOTE — Progress Notes (Signed)
Patient ID: Paul Gilmore, male   DOB: 08/23/1976, 42 y.o.   MRN: [id number]       Subjective: Patient feels much better today.  Hungry.  Tolerated clear liquids well yesterday with no issues.  Objective: Vital signs in last 24 hours: Temp:  [97.7 F (36.5 C)-98.1 F (36.7 C)] 98.1 F (36.7 C) (12/04 0525) Pulse Rate:  [76-82] 82 (12/04 0525) Resp:  [17-20] 17 (12/04 0525) BP: (133-147)/(79-102) 133/79 (12/04 0525) SpO2:  [93 %-96 %] 96 % (12/04 0525) Last BM Date: 10/27/18  Intake/Output from previous day: 12/03 0701 - 12/04 0700 In: 1230.3 [P.O.:1080; IV Piggyback:150.3] Out: -  Intake/Output this shift: No intake/output data recorded.  PE: Heart: regular Lungs: CTAB Abd: soft, much less tender, only slightly tenderness in LLQ, +BS, ND, obese  Lab Results:  Recent Labs    10/28/18 0529 10/29/18 0504  WBC 10.7* 9.4  HGB 15.5 15.1  HCT 45.5 46.2  PLT 361 351   BMET Recent Labs    10/28/18 0529 10/29/18 0504  NA 140 138  K 4.5 4.3  CL 104 103  CO2 26 27  GLUCOSE 93 105*  BUN 18 14  CREATININE 0.98 0.98  CALCIUM 8.8* 8.9   PT/INR No results for input(s): LABPROT, INR in the last 72 hours. CMP     Component Value Date/Time   NA 138 10/29/2018 0504   K 4.3 10/29/2018 0504   CL 103 10/29/2018 0504   CO2 27 10/29/2018 0504   GLUCOSE 105 (H) 10/29/2018 0504   BUN 14 10/29/2018 0504   CREATININE 0.98 10/29/2018 0504   CALCIUM 8.9 10/29/2018 0504   PROT 6.9 10/28/2018 0529   ALBUMIN 3.6 10/28/2018 0529   AST 22 10/28/2018 0529   ALT 24 10/28/2018 0529   ALKPHOS 73 10/28/2018 0529   BILITOT 0.8 10/28/2018 0529   GFRNONAA >60 10/29/2018 0504   GFRAA >60 10/29/2018 0504   Lipase     Component Value Date/Time   LIPASE 26 10/27/2018 1234       Studies/Results: Ct Abdomen Pelvis W Contrast  Result Date: 10/27/2018 CLINICAL DATA:  42 y/o M; 2 days of left lower quadrant abdominal pain. Recent hospitalization for diverticulitis. EXAM: CT ABDOMEN  AND PELVIS WITH CONTRAST TECHNIQUE: Multidetector CT imaging of the abdomen and pelvis was performed using the standard protocol following bolus administration of intravenous contrast. CONTRAST:  100mL ISOVUE-300 IOPAMIDOL (ISOVUE-300) INJECTION 61% COMPARISON:  10/09/2018 CT abdomen and pelvis. FINDINGS: Lower chest: No acute abnormality. Hepatobiliary: Hepatic steatosis. No focal liver abnormality is seen. Status post cholecystectomy. No biliary dilatation. Pancreas: Unremarkable. No pancreatic ductal dilatation or surrounding inflammatory changes. Spleen: Normal in size without focal abnormality. Adrenals/Urinary Tract: Adrenal glands are unremarkable. Kidneys are normal, without renal calculi, focal lesion, or hydronephrosis. Bladder is unremarkable. Stomach/Bowel: Area of inflammation within the mesentery adjacent to sigmoid colon at the site of prior sigmoid diverticulitis. Within the mesentery there is a fistula tract/contained perforation containing foci of air (series 5 images 55, 60, 63). The structure may be contiguous with adjacent loops of small bowel which are contained within the inflamed mesentery. No additional obstructive or inflammatory changes of the bowel. Normal appendix. Vascular/Lymphatic: No significant vascular findings are present. No enlarged abdominal or pelvic lymph nodes. Reproductive: Prostatic calcifications. Other: Stable small right inguinal hernia containing fat. Stable small paraumbilical hernia containing fat. Musculoskeletal: Stable moderate chronic T12 compression deformity. Chronic anterior rib fractures bilaterally. IMPRESSION: 1. Pericolonic mesenteric inflammation at the site of prior sigmoid  diverticulitis. 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 2. Additional stable chronic findings as above. Electronically Signed   By: Mitzi Hansen M.D.   On: 10/27/2018 21:28    Anti-infectives: Anti-infectives (From admission, onward)   Start     Dose/Rate Route Frequency Ordered Stop   10/28/18 1000  ciprofloxacin (CIPRO) IVPB 400 mg  Status:  Discontinued     400 mg 200 mL/hr over 60 Minutes Intravenous Every 12 hours 10/28/18 0434 10/28/18 0947   10/28/18 1000  piperacillin-tazobactam (ZOSYN) IVPB 3.375 g     3.375 g 12.5 mL/hr over 240 Minutes Intravenous Every 8 hours 10/28/18 0947     10/28/18 0800  metroNIDAZOLE (FLAGYL) IVPB 500 mg  Status:  Discontinued     500 mg 100 mL/hr over 60 Minutes Intravenous Every 8 hours 10/27/18 2355 10/28/18 0947   10/27/18 2200  ciprofloxacin (CIPRO) IVPB 400 mg     400 mg 200 mL/hr over 60 Minutes Intravenous  Once 10/27/18 2145 10/27/18 2333   10/27/18 2200  metroNIDAZOLE (FLAGYL) IVPB 500 mg     500 mg 100 mL/hr over 60 Minutes Intravenous  Once 10/27/18 2145 10/28/18 0119       Assessment/Plan 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  he was treated again several weeks ago and discharged on augmentin.  He redeveloped some pain even while on the augmentin, but worsened this past weekend when the Rx ran out.  New CT shows persistent inflammatory changes.   -cont zosyn, will switch to augmentin at discharge again -adv to soft diet today.  If he tolerates this well and is continuing to improve, hopefully he can go home tomorrow with close follow up with Dr. Sheliah Hatch. -mobilize and pulm toilet   FEN - IVFs, soft diet VTE -  SCDs/ ok for chemical prophylaxis from our standpoint ID - Cipro/Flagyl 12/3-->12/3, zosyn 12/3 -->   LOS: 2 days    Letha Cape , [facility] 10/29/2018, 8:24 AM Pager: [phone]

## 2018-10-29 NOTE — Progress Notes (Signed)
PROGRESS NOTE    Paul Gilmore  ZOX:[id number] DOB: 09/14/1976 DOA: 10/27/2018 PCP: Patient, No Pcp Per   Brief Narrative:  The patient is a 42 year old Caucasian male with a past medical history of GERD essential hypertension previous history of diverticulitis presented with abdominal pain.  Found to have changes suggestive of sigmoid diverticulitis on CT scan and was hospitalized for further management.  He was recently in the hospital for same.  **He is improving now and tolerating clear liquid diet with his diet being advanced to soft diet.  Pain is controlled with IV hydromorphone but will restart his home oxycodone and try p.o. pain control.  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   -IV antibiotics changed to IV Zosyn which will be continued today and changed back to p.o. Augmentin likely at discharge -Continue to adjust pain medication as necessary patient is now on IV hydromorphone 1 to 2 mg IV every 3 hours as needed for severe pain and p.o. acetaminophen 650 mg every 6 PRN for mild pain -Continue with antiemetics with p.o. Zofran/IV Zofran every 6 PRN for nausea -Will resume oxycodone IR for moderate pain patient has just been taking IV -Diet is being advanced and general surgery advance from a clear liquid diet to soft diet today -Patient will likely need surgical intervention in the future will need to follow-up with Dr. Sheliah HatchKinsinger in outpatient setting -Possible discharge tomorrow with close follow-up -Continue mobilize and per general surgery continue pulmonary toilet -IV fluids have now been discontinued  Essential Hypertension -Continue to monitor blood  pressures.   -Amlodipine 5 mg p.o. daily was on hold but will resume now. -C/w with IV hydralazine 10 mg every 6 PRN for systolic blood pressure greater than 170 or diastolic blood pressure 100  Obesity -Estimated body mass index is 38.71 kg/m as calculated from the following:   Height as of this encounter: 5\' 11"  (1.803 m).   Weight as of this encounter: 125.9 kg. -Weight Loss Counseling Given   Leukocytosis -Resolved -Patient's WBC went from 11.5 and is now 9.4 -Likely reactive in the setting of above -Continue to monitor for signs and symptoms of infection -Continue with IV Zosyn -Repeat CBC in a.m.  Hyperglycemia -Patient's blood sugar has been elevated on CMP/BNP is in range from 93-134 -Will need Outpatient hemoglobin A1c to assess for diabetes -Continue monitor blood sugars and if persistently elevated will need to place on sensitive NovoLog sliding scale AC   DVT prophylaxis: SCDs; will start heparin 5000 units subcu every 8 Code Status: FULL CODE Family Communication: Discussed with wife at bedside Disposition Plan: Anticipate D/C in the next 24-48 hours if is able to tolerate soft diet today without issues and is cleared by General Surgery   Consultants:   General Surgery    Procedures:  None   Antimicrobials: Anti-infectives (From admission, onward)   Start     Dose/Rate Route Frequency Ordered Stop   10/28/18 1000  ciprofloxacin (CIPRO) IVPB 400 mg  Status:  Discontinued     400 mg 200 mL/hr over 60 Minutes Intravenous Every 12 hours 10/28/18 0434 10/28/18 0947   10/28/18 1000  piperacillin-tazobactam (ZOSYN) IVPB 3.375 g     3.375 g 12.5 mL/hr  over 240 Minutes Intravenous Every 8 hours 10/28/18 0947     10/28/18 0800  metroNIDAZOLE (FLAGYL) IVPB 500 mg  Status:  Discontinued     500 mg 100 mL/hr over 60 Minutes Intravenous Every 8 hours 10/27/18 2355 10/28/18 0947   10/27/18 2200  ciprofloxacin (CIPRO) IVPB 400 mg     400 mg 200 mL/hr over 60 Minutes  Intravenous  Once 10/27/18 2145 10/27/18 2333   10/27/18 2200  metroNIDAZOLE (FLAGYL) IVPB 500 mg     500 mg 100 mL/hr over 60 Minutes Intravenous  Once 10/27/18 2145 10/28/18 0119     Subjective: Seen and examined at bedside states that he is doing much better than yesterday and able to tolerate clear liquid diet and hopefully advance to soft diet today.  Still has some left lower quadrant abdominal pain but nothing like when he presented.  No chest pain, lightheadedness or dizziness.  No nausea or vomiting.  No other concerns or complaints at this time.  Objective: Vitals:   10/28/18 0635 10/28/18 1256 10/28/18 2054 10/29/18 0525  BP: 136/78 (!) 147/102 (!) 139/97 133/79  Pulse: 79 76 76 82  Resp: 17 20 18 17   Temp: 97.9 F (36.6 C) 97.7 F (36.5 C) 97.9 F (36.6 C) 98.1 F (36.7 C)  TempSrc: Oral Oral Oral Oral  SpO2: 96% 93% 95% 96%  Weight:      Height:        Intake/Output Summary (Last 24 hours) at 10/29/2018 1220 Last data filed at 10/29/2018 0941 Gross per 24 hour  Intake 1660.3 ml  Output -  Net 1660.3 ml   Filed Weights   10/27/18 1218 10/27/18 2351  Weight: 129.3 kg 125.9 kg   Examination: Physical Exam:  Constitutional: WN/WD obese Caucasian male in NAD and appears calm and comfortable sitting in chair bedside Eyes: Lids and conjunctivae normal, sclerae anicteric  ENMT: External Ears, Nose appear normal. Grossly normal hearing. Mucous membranes are moist.  Neck: Appears normal, supple, no cervical masses, normal ROM, no appreciable thyromegaly Respiratory: Clear to auscultation bilaterally, no wheezing, rales, rhonchi or crackles. Normal respiratory effort and patient is not tachypenic. No accessory muscle use.  Cardiovascular: RRR, no murmurs / rubs / gallops. S1 and S2 auscultated. No extremity edema. 2+ pedal pulses. No carotid bruits.  Abdomen: Soft, mildly tender in the LLQ, Distended 2/2 body habitus. No masses palpated. No appreciable  hepatosplenomegaly. Bowel sounds positive x4.  GU: Deferred. Musculoskeletal: No clubbing / cyanosis of digits/nails. Normal strength and muscle tone.  Skin: No rashes, lesions, ulcers on a limited skin eval. No induration; Warm and dry. Has a Right Leg Tattoo  Neurologic: CN 2-12 grossly intact with no focal deficits. Romberg sign and cerebellar reflexes not assessed.  Psychiatric: Normal judgment and insight. Alert and oriented x 3. Normal mood and appropriate affect.   Data Reviewed: I have personally reviewed following labs and imaging studies  CBC: Recent Labs  Lab 10/27/18 1234 10/28/18 0529 10/29/18 0504  WBC 11.5* 10.7* 9.4  HGB 16.3 15.5 15.1  HCT 49.1 45.5 46.2  MCV 91.4 89.7 93.5  PLT 427* 361 351   Basic Metabolic Panel: Recent Labs  Lab 10/27/18 1234 10/28/18 0529 10/29/18 0504  NA 141 140 138  K 3.9 4.5 4.3  CL 105 104 103  CO2 25 26 27   GLUCOSE 134* 93 105*  BUN 17 18 14   CREATININE 0.97 0.98 0.98  CALCIUM 9.4 8.8* 8.9  MG  --  2.1  --  PHOS  --  3.5  --    GFR: Estimated Creatinine Clearance: 131.3 mL/min (by C-G formula based on SCr of 0.98 mg/dL). Liver Function Tests: Recent Labs  Lab 10/27/18 1234 10/28/18 0529  AST 22 22  ALT 27 24  ALKPHOS 78 73  BILITOT 0.5 0.8  PROT 7.7 6.9  ALBUMIN 4.0 3.6   Recent Labs  Lab 10/27/18 1234  LIPASE 26   No results for input(s): AMMONIA in the last 168 hours. Coagulation Profile: No results for input(s): INR, PROTIME in the last 168 hours. Cardiac Enzymes: No results for input(s): CKTOTAL, CKMB, CKMBINDEX, TROPONINI in the last 168 hours. BNP (last 3 results) No results for input(s): PROBNP in the last 8760 hours. HbA1C: No results for input(s): HGBA1C in the last 72 hours. CBG: No results for input(s): GLUCAP in the last 168 hours. Lipid Profile: No results for input(s): CHOL, HDL, LDLCALC, TRIG, CHOLHDL, LDLDIRECT in the last 72 hours. Thyroid Function Tests: Recent Labs     10/28/18 0806  TSH 1.785   Anemia Panel: No results for input(s): VITAMINB12, FOLATE, FERRITIN, TIBC, IRON, RETICCTPCT in the last 72 hours. Sepsis Labs: No results for input(s): PROCALCITON, LATICACIDVEN in the last 168 hours.  No results found for this or any previous visit (from the past 240 hour(s)).    Radiology Studies: Ct Abdomen Pelvis W Contrast  Result Date: 10/27/2018 CLINICAL DATA:  42 y/o M; 2 days of left lower quadrant abdominal pain. Recent hospitalization for diverticulitis. EXAM: CT ABDOMEN AND PELVIS WITH CONTRAST TECHNIQUE: Multidetector CT imaging of the abdomen and pelvis was performed using the standard protocol following bolus administration of intravenous contrast. CONTRAST:  ISOVUE-300 IOPAMIDOL (ISOVUE-300) INJECTION 61% COMPARISON:  10/09/2018 CT abdomen and pelvis. FINDINGS: Lower chest: No acute abnormality. Hepatobiliary: Hepatic steatosis. No focal liver abnormality is seen. Status post cholecystectomy. No biliary dilatation. Pancreas: Unremarkable. No pancreatic ductal dilatation or surrounding inflammatory changes. Spleen: Normal in size without focal abnormality. Adrenals/Urinary Tract: Adrenal glands are unremarkable. Kidneys are normal, without renal calculi, focal lesion, or hydronephrosis. Bladder is unremarkable. Stomach/Bowel: Area of inflammation within the mesentery adjacent to sigmoid colon at the site of prior sigmoid diverticulitis. Within the mesentery there is a fistula tract/contained perforation containing foci of air (series 5 images 55, 60, 63). The structure may be contiguous with adjacent loops of small bowel which are contained within the inflamed mesentery. No additional obstructive or inflammatory changes of the bowel. Normal appendix. Vascular/Lymphatic: No significant vascular findings are present. No enlarged abdominal or pelvic lymph nodes. Reproductive: Prostatic calcifications. Other: Stable small right inguinal hernia containing fat.  Stable small paraumbilical hernia containing fat. Musculoskeletal: Stable moderate chronic T12 compression deformity. Chronic anterior rib fractures bilaterally. IMPRESSION: 1. Pericolonic mesenteric inflammation at the site of prior sigmoid diverticulitis. 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 2. Additional stable chronic findings as above. Electronically Signed   By: Mitzi Hansen M.D.   On: 10/27/2018 21:28   Scheduled Meds: Continuous Infusions: . piperacillin-tazobactam (ZOSYN)  IV 3.375 g (10/29/18 0942)    LOS: 2 days   Merlene Laughter, DO [facility] PAGER is on AMION  If 7PM-7AM, please contact night-coverage [email or web address] Password [email or web address] 10/29/2018, 12:20 PM

## 2018-10-30 LAB — CBC WITH DIFFERENTIAL/PLATELET
Abs Immature Granulocytes: 0.02 10*3/uL (ref 0.00–0.07)
Basophils Absolute: 0.1 10*3/uL (ref 0.0–0.1)
Basophils Relative: 1 %
Eosinophils Absolute: 0.3 10*3/uL (ref 0.0–0.5)
Eosinophils Relative: 4 %
HCT: 47.2 % (ref 39.0–52.0)
HEMOGLOBIN: 15.7 g/dL (ref 13.0–17.0)
Immature Granulocytes: 0 %
Lymphocytes Relative: 34 %
Lymphs Abs: 2.7 10*3/uL (ref 0.7–4.0)
MCH: 30.8 pg (ref 26.0–34.0)
MCHC: 33.3 g/dL (ref 30.0–36.0)
MCV: 92.7 fL (ref 80.0–100.0)
MONOS PCT: 6 %
Monocytes Absolute: 0.5 10*3/uL (ref 0.1–1.0)
Neutro Abs: 4.5 10*3/uL (ref 1.7–7.7)
Neutrophils Relative %: 55 %
Platelets: 402 10*3/uL — ABNORMAL HIGH (ref 150–400)
RBC: 5.09 MIL/uL (ref 4.22–5.81)
RDW: 11.8 % (ref 11.5–15.5)
WBC: 8 10*3/uL (ref 4.0–10.5)
nRBC: 0 % (ref 0.0–0.2)

## 2018-10-30 LAB — COMPREHENSIVE METABOLIC PANEL
ALT: 25 U/L (ref 0–44)
AST: 20 U/L (ref 15–41)
Albumin: 3.9 g/dL (ref 3.5–5.0)
Alkaline Phosphatase: 78 U/L (ref 38–126)
Anion gap: 10 (ref 5–15)
BUN: 17 mg/dL (ref 6–20)
CALCIUM: 9.1 mg/dL (ref 8.9–10.3)
CO2: 25 mmol/L (ref 22–32)
CREATININE: 1.11 mg/dL (ref 0.61–1.24)
Chloride: 104 mmol/L (ref 98–111)
GFR calc Af Amer: >60 mL/min (ref >60)
GFR calc non Af Amer: >60 mL/min (ref >60)
Glucose, Bld: 125 mg/dL — ABNORMAL HIGH (ref 70–99)
Potassium: 4.4 mmol/L (ref 3.5–5.1)
Sodium: 139 mmol/L (ref 135–145)
Total Bilirubin: 0.5 mg/dL (ref 0.3–1.2)
Total Protein: 7.5 g/dL (ref 6.5–8.1)

## 2018-10-30 LAB — MAGNESIUM: Magnesium: 2.3 mg/dL (ref 1.7–2.4)

## 2018-10-30 LAB — PHOSPHORUS: Phosphorus: 3.1 mg/dL (ref 2.5–4.6)

## 2018-10-30 MED ORDER — ACETAMINOPHEN 325 MG PO TABS: 1000.0000 mg | ORAL_TABLET | Freq: Four times a day (QID) | ORAL | Status: DC | PRN

## 2018-10-30 MED ORDER — BISACODYL 10 MG RE SUPP
10.0000 mg | Freq: Once | RECTAL | Status: AC
  Administered 2018-10-30: 10 mg via RECTAL
  Filled 2018-10-30: qty 1

## 2018-10-30 MED ORDER — SODIUM CHLORIDE 0.9 % IV SOLN
INTRAVENOUS | Status: DC | PRN
  Administered 2018-10-30: 500 mL via INTRAVENOUS

## 2018-10-30 MED ORDER — AMOXICILLIN-POT CLAVULANATE 875-125 MG PO TABS: 1.0000 | ORAL_TABLET | Freq: Two times a day (BID) | ORAL | 1 refills | Status: AC

## 2018-10-30 MED ORDER — ONDANSETRON HCL 4 MG PO TABS: 4.0000 mg | ORAL_TABLET | Freq: Four times a day (QID) | ORAL | 0 refills | Status: DC | PRN

## 2018-10-30 MED ORDER — SENNOSIDES-DOCUSATE SODIUM 8.6-50 MG PO TABS: 1.0000 | ORAL_TABLET | Freq: Every day | ORAL | 0 refills | Status: DC

## 2018-10-30 MED ORDER — BISACODYL 10 MG RE SUPP: 10.0000 mg | Freq: Every day | RECTAL | 0 refills | Status: DC | PRN

## 2018-10-30 MED ORDER — OXYCODONE HCL 5 MG PO TABS: 5.0000 mg | ORAL_TABLET | Freq: Four times a day (QID) | ORAL | 0 refills | Status: DC | PRN

## 2018-10-30 MED ORDER — POLYETHYLENE GLYCOL 3350 17 G PO PACK: 17.0000 g | PACK | Freq: Every day | ORAL | 0 refills | Status: DC

## 2018-10-30 NOTE — Progress Notes (Signed)
Patient discharged to home with family, discharge instructions reviewed with patient who verbalized understanding. 

## 2018-10-30 NOTE — Discharge Instructions (Signed)
Low-Fiber Diet °Fiber is found in fruits, vegetables, and whole grains. A low-fiber diet restricts fibrous foods that are not digested in the small intestine. A diet containing about 10-15 grams of fiber per day is considered low fiber. Low-fiber diets may be used to: °· Promote healing and rest the bowel during intestinal flare-ups. °· Prevent blockage of a partially obstructed or narrowed gastrointestinal tract. °· Reduce fecal weight and volume. °· Slow the movement of feces. ° °You may be on a low-fiber diet as a transitional diet following surgery, after an injury (trauma), or because of a short (acute) or lifelong (chronic) illness. Your health care provider will determine the length of time you need to stay on this diet. °What do I need to know about a low-fiber diet? °Always check the fiber content on the packaging's Nutrition Facts label, especially on foods from the grains list. Ask your dietitian if you have questions about specific foods that are related to your condition, especially if the food is not listed below. In general, a low-fiber food will have less than 2 g of fiber. °What foods can I eat? °Grains °All breads and crackers made with white flour. Sweet rolls, doughnuts, waffles, pancakes, French toast, bagels. Pretzels, Melba toast, zwieback. Well-cooked cereals, such as cornmeal, farina, or cream cereals. Dry cereals that do not contain whole grains, fruit, or nuts, such as refined corn, wheat, rice, and oat cereals. Potatoes prepared any way without skins, plain pastas and noodles, refined white rice. Use white flour for baking and making sauces. Use allowed list of grains for casseroles, dumplings, and puddings. °Vegetables °Strained tomato and vegetable juices. Fresh lettuce, cucumber, spinach. Well-cooked (no skin or pulp) or canned vegetables, such as asparagus, bean sprouts, beets, carrots, green beans, mushrooms, potatoes, pumpkin, spinach, yellow squash, tomato sauce/puree, turnips,  yams, and zucchini. Keep servings limited to ½ cup. °Fruits °All fruit juices except prune juice. Cooked or canned fruits without skin and seeds, such as applesauce, apricots, cherries, fruit cocktail, grapefruit, grapes, mandarin oranges, melons, peaches, pears, pineapple, and plums. Fresh fruits without skin, such as apricots, avocados, bananas, melons, pineapple, nectarines, and peaches. Keep servings limited to ½ cup or 1 piece. °Meat and Other Protein Sources °Ground or well-cooked tender beef, ham, veal, lamb, pork, or poultry. Eggs, plain cheese. Fish, oysters, shrimp, lobster, and other seafood. Liver, organ meats. Smooth nut butters. °Dairy °All milk products and alternative dairy substitutes, such as soy, rice, almond, and coconut, not containing added whole nuts, seeds, or added fruit. °Beverages °Decaf coffee, fruit, and vegetable juices or smoothies (small amounts, with no pulp or skins, and with fruits from allowed list), sports drinks, herbal tea. °Condiments °Ketchup, mustard, vinegar, cream sauce, cheese sauce, cocoa powder. Spices in moderation, such as allspice, basil, bay leaves, celery powder or leaves, cinnamon, cumin powder, curry powder, ginger, mace, marjoram, onion or garlic powder, oregano, paprika, parsley flakes, ground pepper, rosemary, sage, savory, tarragon, thyme, and turmeric. °Sweets and Desserts °Plain cakes and cookies, pie made with allowed fruit, pudding, custard, cream pie. Gelatin, fruit, ice, sherbet, frozen ice pops. Ice cream, ice milk without nuts. Plain hard candy, honey, jelly, molasses, syrup, sugar, chocolate syrup, gumdrops, marshmallows. Limit overall sugar intake. °Fats and Oil °Margarine, butter, cream, mayonnaise, salad oils, plain salad dressings made from allowed foods. Choose healthy fats such as olive oil, canola oil, and omega-3 fatty acids (such as found in salmon or tuna) when possible. °Other °Bouillon, broth, or cream soups made from allowed foods. Any    strained soup. Casseroles or mixed dishes made with allowed foods. °The items listed above may not be a complete list of recommended foods or beverages. Contact your dietitian for more options. °What foods are not recommended? °Grains °All whole wheat and whole grain breads and crackers. Multigrains, rye, bran seeds, nuts, or coconut. Cereals containing whole grains, multigrains, bran, coconut, nuts, raisins. Cooked or dry oatmeal, steel-cut oats. Coarse wheat cereals, granola. Cereals advertised as high fiber. Potato skins. Whole grain pasta, wild or brown rice. Popcorn. Coconut flour. Bran, buckwheat, corn bread, multigrains, rye, wheat germ. °Vegetables °Fresh, cooked or canned vegetables, such as artichokes, asparagus, beet greens, broccoli, Brussels sprouts, cabbage, celery, cauliflower, corn, eggplant, kale, legumes or beans, okra, peas, and tomatoes. Avoid large servings of any vegetables, especially raw vegetables. °Fruits °Fresh fruits, such as apples with or without skin, berries, cherries, figs, grapes, grapefruit, guavas, kiwis, mangoes, oranges, papayas, pears, persimmons, pineapple, and pomegranate. Prune juice and juices with pulp, stewed or dried prunes. Dried fruits, dates, raisins. Fruit seeds or skins. Avoid large servings of all fresh fruits. °Meats and Other Protein Sources °Tough, fibrous meats with gristle. Chunky nut butter. Cheese made with seeds, nuts, or other foods not recommended. Nuts, seeds, legumes (beans, including baked beans), dried peas, beans, lentils. °Dairy °Yogurt or cheese that contains nuts, seeds, or added fruit. °Beverages °Fruit juices with high pulp, prune juice. Caffeinated coffee and teas. °Condiments °Coconut, maple syrup, pickles, olives. °Sweets and Desserts °Desserts, cookies, or candies that contain nuts or coconut, chunky peanut butter, dried fruits. Jams, preserves with seeds, marmalade. Large amounts of sugar and sweets. Any other dessert made with fruits from  the not recommended list. °Other °Soups made from vegetables that are not recommended or that contain other foods not recommended. °The items listed above may not be a complete list of foods and beverages to avoid. Contact your dietitian for more information. °This information is not intended to replace advice given to you by your health care provider. Make sure you discuss any questions you have with your health care provider. °Document Released: 05/04/2002 Document Revised: 04/19/2016 Document Reviewed: 10/05/2013 °[employer] Interactive Patient Education © 2017 [employer] ° °

## 2018-10-30 NOTE — Discharge Summary (Addendum)
Physician Discharge Summary  ARDA KEADLE WUJ:[id number] DOB: Jun 30, 1976 DOA: 10/27/2018  PCP: Patient, No Pcp Per  Admit date: 10/27/2018 Discharge date: 10/30/2018  Admitted From: Home Disposition: Home  Recommendations for Outpatient Follow-up:  1. Follow up with and establish PCP in 1-2 weeks 2. Follow up with General Surgery 3. Please obtain CMP/CBC, Mag, Phos in one week 4. Please follow up on the following pending results:  Home Health: No  Equipment/Devices: None  Discharge Condition: Stable CODE STATUS: FULL CODE Diet recommendation: Soft Heart Healthy Diet  Brief/Interim Summary: The patient is a 42 year old Caucasian male with a past medical history of GERD, essential hypertension, previous history of diverticulitis presented with abdominal pain. Found to have changes suggestive of sigmoid diverticulitis on CT scan and was hospitalized for further management. He was recently in the hospital for same.  **He is improved and tolerated Soft diet  Pain was controlled with IV hydromorphone but will restart his home oxycodone and try p.o. pain control and po control was adequate.  Patient's hospitalization was complicated by constipation but improved with a suppository.  Neurosurgery cleared the patient for discharge and patient was deemed medically stable for discharge at this time will need to follow-up with and establish with PCP within 1 week and follow-up with general surgery in 2 weeks.  General surgery recommends continuing IV antibiotics with Augmentin and I have written a prescription for them along with pain medication.  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  -IV antibiotics changed to IV Zosyn which will be continued today and changed back to p.o.  Augmentin likely at discharge -Continue to adjust pain medication as necessary patient is now on IV hydromorphone 1 to 2 mg IV every 3 hours as needed for severe pain and p.o. acetaminophen 650 mg every 6 PRN for mild pain -Continue with antiemetics with p.o. Zofran/IV Zofran every 6 PRN for nausea -Resumed Oxycodone IR for moderate pain patient has just been taking IV -Diet is being advanced and general surgery advance from a clear liquid diet to soft diet yesterday -Patient will likely need surgical intervention in the future will need to follow-up with Dr. Sheliah Hatch in outpatient setting -Continue mobilize and per general surgery continue pulmonary toilet -IV fluids have now been discontinued -Patient is improved in general surgery is recommended outpatient antibiotics for at least 2 weeks and follow-up with Dr. Sheliah Hatch.  He will be discharged home need to follow-up and establish with PCP within 1 week  Essential Hypertension -Continue to monitor blood pressures.  -Resumed Amlodipine 5 mg p.o. daily  -C/w with IV hydralazine 10 mg every 6 PRN for systolic blood pressure greater than 170 or diastolic blood pressure 100 while hospitalized   Obesity -Estimated body mass index is 38.71 kg/m as calculated from the following:   Height as of this encounter: 5\' 11"  (1.803 m).   Weight as of this encounter: 125.9 kg., -Weight Loss Counseling Given   Leukocytosis, improved  -Resolved -Patient's WBC went from 11.5 and is now 8.0 -Likely reactive in the setting of above -Continue to monitor for signs and symptoms of infection -Continue with IV Zosyn -Repeat CBC in a.m.  Hyperglycemia -Patient's blood sugar has been elevated on CMP/BNP is in range from 93-134 -Will need Outpatient hemoglobin A1c to assess for diabetes -Continue monitor blood sugars and if persistently elevated will  need to place on sensitive NovoLog sliding scale AC   Thrombocytosis -Mild and likely  reactive -Continue monitor and repeat CBC in outpatient setting  Discharge Instructions  Discharge Instructions    Call MD for:  difficulty breathing, headache or visual disturbances   Complete by:  As directed    Call MD for:  extreme fatigue   Complete by:  As directed    Call MD for:  hives   Complete by:  As directed    Call MD for:  persistant dizziness or light-headedness   Complete by:  As directed    Call MD for:  persistant nausea and vomiting   Complete by:  As directed    Call MD for:  redness, tenderness, or signs of infection (pain, swelling, redness, odor or green/yellow discharge around incision site)   Complete by:  As directed    Call MD for:  severe uncontrolled pain   Complete by:  As directed    Call MD for:  temperature >100.4   Complete by:  As directed    Diet - low sodium heart healthy   Complete by:  As directed    Discharge instructions   Complete by:  As directed    You were cared for by a hospitalist during your hospital stay. If you have any questions about your discharge medications or the care you received while you were in the hospital after you are discharged, you can call the unit and ask to speak with the hospitalist on call if the hospitalist that took care of you is not available. Once you are discharged, your primary care physician will handle any further medical issues. Please note that NO REFILLS for any discharge medications will be authorized once you are discharged, as it is imperative that you return to your primary care physician (or establish a relationship with a primary care physician if you do not have one) for your aftercare needs so that they can reassess your need for medications and monitor your lab values.  Follow up with and establish with PCP and with General Surgery. Take all medications as prescribed. If symptoms change or worsen please return to the ED for evaluation   Increase activity slowly   Complete by:  As directed       Allergies as of 10/30/2018   No Known Allergies     Medication List    TAKE these medications   acetaminophen 325 MG tablet Commonly known as:  TYLENOL Take 3 tablets (975 mg total) by mouth every 6 (six) hours as needed for mild pain (or Fever >/= 101).   albuterol 108 (90 Base) MCG/ACT inhaler Commonly known as:  PROVENTIL HFA;VENTOLIN HFA Inhale 1 puff into the lungs every 6 (six) hours as needed for wheezing or shortness of breath.   amLODipine 5 MG tablet Commonly known as:  NORVASC Take 1 tablet (5 mg total) by mouth daily.   amoxicillin-clavulanate 875-125 MG tablet Commonly known as:  AUGMENTIN Take 1 tablet by mouth 2 (two) times daily for 14 days.   bisacodyl 10 MG suppository Commonly known as:  DULCOLAX Place 1 suppository (10 mg total) rectally daily as needed for moderate constipation.   ibuprofen 200 MG tablet Commonly known as:  ADVIL,MOTRIN Take 400 mg by mouth every 6 (six) hours as needed for mild pain.   ondansetron 4 MG tablet Commonly known as:  ZOFRAN Take 1 tablet (4 mg total) by mouth every 6 (six) hours as needed for nausea.  oxyCODONE 5 MG immediate release tablet Commonly known as:  Oxy IR/ROXICODONE Take 1 tablet (5 mg total) by mouth every 6 (six) hours as needed for moderate pain. What changed:  Another medication with the same name was removed. Continue taking this medication, and follow the directions you see here.   polyethylene glycol packet Commonly known as:  MIRALAX / GLYCOLAX Take 17 g by mouth daily.   PROBIOTIC PO Take 1 capsule by mouth daily.   psyllium 0.52 g capsule Commonly known as:  REGULOID Take 1.04 g by mouth every 4 (four) hours.   senna-docusate 8.6-50 MG tablet Commonly known as:  Senokot-S Take 1 tablet by mouth at bedtime.      Follow-up Information    Kinsinger, De Blanch, MD Follow up on 11/12/2018.   Specialty:  General Surgery Why:  3:00pm, arrive by 2:45pm for paperwork and check in Contact  information: [address] [place] [address] [phone]          No Known Allergies  Consultations:  General Surgery  Procedures/Studies: Ct Abdomen Pelvis W Contrast  Result Date: 10/27/2018 CLINICAL DATA:  43 y/o M; 2 days of left lower quadrant abdominal pain. Recent hospitalization for diverticulitis. EXAM: CT ABDOMEN AND PELVIS WITH CONTRAST TECHNIQUE: Multidetector CT imaging of the abdomen and pelvis was performed using the standard protocol following bolus administration of intravenous contrast. CONTRAST:  ISOVUE-300 IOPAMIDOL (ISOVUE-300) INJECTION 61% COMPARISON:  10/09/2018 CT abdomen and pelvis. FINDINGS: Lower chest: No acute abnormality. Hepatobiliary: Hepatic steatosis. No focal liver abnormality is seen. Status post cholecystectomy. No biliary dilatation. Pancreas: Unremarkable. No pancreatic ductal dilatation or surrounding inflammatory changes. Spleen: Normal in size without focal abnormality. Adrenals/Urinary Tract: Adrenal glands are unremarkable. Kidneys are normal, without renal calculi, focal lesion, or hydronephrosis. Bladder is unremarkable. Stomach/Bowel: Area of inflammation within the mesentery adjacent to sigmoid colon at the site of prior sigmoid diverticulitis. Within the mesentery there is a fistula tract/contained perforation containing foci of air (series 5 images 55, 60, 63). The structure may be contiguous with adjacent loops of small bowel which are contained within the inflamed mesentery. No additional obstructive or inflammatory changes of the bowel. Normal appendix. Vascular/Lymphatic: No significant vascular findings are present. No enlarged abdominal or pelvic lymph nodes. Reproductive: Prostatic calcifications. Other: Stable small right inguinal hernia containing fat. Stable small paraumbilical hernia containing fat. Musculoskeletal: Stable moderate chronic T12 compression deformity. Chronic anterior rib fractures bilaterally.  IMPRESSION: 1. Pericolonic mesenteric inflammation at the site of prior sigmoid diverticulitis. 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 2. Additional stable chronic findings as above. Electronically Signed   By: Mitzi Hansen M.D.   On: 10/27/2018 21:28   Ct Abdomen Pelvis W Contrast  Result Date: 10/09/2018 CLINICAL DATA:  Severe lower abdominal pain since Tuesday. EXAM: CT ABDOMEN AND PELVIS WITH CONTRAST TECHNIQUE: Multidetector CT imaging of the abdomen and pelvis was performed using the standard protocol following bolus administration of intravenous contrast. CONTRAST:  ISOVUE-300 IOPAMIDOL (ISOVUE-300) INJECTION 61% COMPARISON:  03/22/2018 CT FINDINGS: Lower chest: Normal heart size. No pericardial effusion or thickening. Dependent bibasilar atelectasis. Hepatobiliary: Steatosis of the liver. No space-occupying mass. Normal gallbladder. Pancreas: Normal Spleen: Normal Adrenals/Urinary Tract: Adrenal glands are unremarkable. Kidneys are normal, without renal calculi, focal lesion, or hydronephrosis. Bladder is unremarkable. Stomach/Bowel: Diverticulosis of the sigmoid colon complicated by acute diverticulitis along the mid to  distal portion. Subtle area of hypodensity consistent with an intramural abscess is noted measuring 15 x 11 mm along the posterior wall. No extraluminal gas. No pelvic abscess. The remainder of the gastrointestinal tract is nonacute. Vascular/Lymphatic: No significant vascular findings are present. No enlarged abdominal or pelvic lymph nodes. Reproductive: Normal size prostate with peripheral zone calcifications. Other: Small fat containing right inguinal hernia. Small periumbilical fat containing hernia. No free air. Musculoskeletal: Chronic moderate T12 compression. No aggressive osseous lesions. IMPRESSION: 1. Acute sigmoid  diverticulitis with developing intramural abscess measuring 15 x 11 mm along the posterior wall. No extraluminal gas or pelvic abscess. 2. Hepatic steatosis. 3. Chronic T12 compression. Electronically Signed   By: Tollie Eth M.D.   On: 10/09/2018 20:21    Subjective: Seen and examined at bedside was doing well.  Denying chest pain, lightheadedness or dizziness.  No nausea or vomiting.  Tolerated diet well. No other concerns or complaints at this time is ready to go home.  Discharge Exam: Vitals:   10/29/18 2212 10/30/18 0617  BP: 138/76 132/88  Pulse: 79 76  Resp: 17 18  Temp: 98 F (36.7 C) 98.1 F (36.7 C)  SpO2: 96% 95%   Vitals:   10/29/18 0525 10/29/18 1408 10/29/18 2212 10/30/18 0617  BP: 133/79 (!) 142/97 138/76 132/88  Pulse: 82 81 79 76  Resp: 17 16 17 18   Temp: 98.1 F (36.7 C) (!) 97.5 F (36.4 C) 98 F (36.7 C) 98.1 F (36.7 C)  TempSrc: Oral Oral Oral Oral  SpO2: 96% 94% 96% 95%  Weight:      Height:       General: Pt is alert, awake, not in acute distress Cardiovascular: RRR, S1/S2 +, no rubs, no gallops Respiratory: CTA bilaterally, no wheezing, no rhonchi Abdominal: Soft, NT, ND, bowel sounds + Extremities: no edema, no cyanosis  The results of significant diagnostics from this hospitalization (including imaging, microbiology, ancillary and laboratory) are listed below for reference.    Microbiology: No results found for this or any previous visit (from the past 240 hour(s)).   Labs: BNP (last 3 results) No results for input(s): BNP in the last 8760 hours. Basic Metabolic Panel: Recent Labs  Lab 10/27/18 1234 10/28/18 0529 10/29/18 0504  NA 141 140 138  K 3.9 4.5 4.3  CL 105 104 103  CO2 25 26 27   GLUCOSE 134* 93 105*  BUN 17 18 14   CREATININE 0.97 0.98 0.98  CALCIUM 9.4 8.8* 8.9  MG  --  2.1  --   PHOS  --  3.5  --    Liver Function Tests: Recent Labs  Lab 10/27/18 1234 10/28/18 0529  AST 22 22  ALT 27 24  ALKPHOS 78 73  BILITOT  0.5 0.8  PROT 7.7 6.9  ALBUMIN 4.0 3.6   Recent Labs  Lab 10/27/18 1234  LIPASE 26   No results for input(s): AMMONIA in the last 168 hours. CBC: Recent Labs  Lab 10/27/18 1234 10/28/18 0529 10/29/18 0504  WBC 11.5* 10.7* 9.4  HGB 16.3 15.5 15.1  HCT 49.1 45.5 46.2  MCV 91.4 89.7 93.5  PLT 427* 361 351   Cardiac Enzymes: No results for input(s): CKTOTAL, CKMB, CKMBINDEX, TROPONINI in the last 168 hours. BNP: Invalid input(s): POCBNP CBG: No results for input(s): GLUCAP in the last 168 hours. D-Dimer No results for input(s): DDIMER in the last 72 hours. Hgb A1c No results for input(s): HGBA1C in the last 72 hours. Lipid Profile No results  for input(s): CHOL, HDL, LDLCALC, TRIG, CHOLHDL, LDLDIRECT in the last 72 hours. Thyroid function studies Recent Labs    10/28/18 0806  TSH 1.785   Anemia work up No results for input(s): VITAMINB12, FOLATE, FERRITIN, TIBC, IRON, RETICCTPCT in the last 72 hours. Urinalysis    Component Value Date/Time   COLORURINE YELLOW 10/27/2018 1230   APPEARANCEUR CLEAR 10/27/2018 1230   LABSPEC 1.026 10/27/2018 1230   PHURINE 5.0 10/27/2018 1230   GLUCOSEU 150 (A) 10/27/2018 1230   HGBUR NEGATIVE 10/27/2018 1230   BILIRUBINUR NEGATIVE 10/27/2018 1230   KETONESUR NEGATIVE 10/27/2018 1230   PROTEINUR NEGATIVE 10/27/2018 1230   NITRITE NEGATIVE 10/27/2018 1230   LEUKOCYTESUR NEGATIVE 10/27/2018 1230   Sepsis Labs Invalid input(s): PROCALCITONIN,  WBC,  LACTICIDVEN Microbiology No results found for this or any previous visit (from the past 240 hour(s)).  Time coordinating discharge: 35 minutes  SIGNED:  Merlene Laughter, DO [facility] 10/30/2018, 10:02 AM Pager is on AMION  If 7PM-7AM, please contact night-coverage [email or web address] Password [email or web address]

## 2018-10-30 NOTE — Care Management (Signed)
This CM checked in with pt at bedside due to no PCP listed. Pt states that he just needs to choose a PCP and he plans to go on the [employer] web site to see who he can choose. Pt states he will do this when he gets home. Rito Lecomte ClementSandford Crazes RN,BSN [phone]

## 2018-10-30 NOTE — Progress Notes (Signed)
Patient ID: Paul Gilmore, male   DOB: [birth year], 42 y.o.   MRN: [id number]       Subjective: Tolerating soft diet.  Pain is improving.  No longer on IV pain meds.  Transitioned to oral pain meds and trying to space those out.  AF.  Objective: Vital signs in last 24 hours: Temp:  [97.5 F (36.4 C)-98.1 F (36.7 C)] 98.1 F (36.7 C) (12/05 0617) Pulse Rate:  [76-81] 76 (12/05 0617) Resp:  [16-18] 18 (12/05 0617) BP: (132-142)/(76-97) 132/88 (12/05 0617) SpO2:  [94 %-96 %] 95 % (12/05 0617) Last BM Date: 10/27/18  Intake/Output from previous day: 12/04 0701 - 12/05 0700 In: 1336.2 [P.O.:1080; I.V.:2.1; IV Piggyback:254.2] Out: -  Intake/Output this shift: No intake/output data recorded.  PE: Heart: regular Lungs: CTAB Abd: soft, essentially NT today, +BS, obese.  Lab Results:  Recent Labs    10/28/18 0529 10/29/18 0504  WBC 10.7* 9.4  HGB 15.5 15.1  HCT 45.5 46.2  PLT 361 351   BMET Recent Labs    10/28/18 0529 10/29/18 0504  NA 140 138  K 4.5 4.3  CL 104 103  CO2 26 27  GLUCOSE 93 105*  BUN 18 14  CREATININE 0.98 0.98  CALCIUM 8.8* 8.9   PT/INR No results for input(s): LABPROT, INR in the last 72 hours. CMP     Component Value Date/Time   NA 138 10/29/2018 0504   K 4.3 10/29/2018 0504   CL 103 10/29/2018 0504   CO2 27 10/29/2018 0504   GLUCOSE 105 (H) 10/29/2018 0504   BUN 14 10/29/2018 0504   CREATININE 0.98 10/29/2018 0504   CALCIUM 8.9 10/29/2018 0504   PROT 6.9 10/28/2018 0529   ALBUMIN 3.6 10/28/2018 0529   AST 22 10/28/2018 0529   ALT 24 10/28/2018 0529   ALKPHOS 73 10/28/2018 0529   BILITOT 0.8 10/28/2018 0529   GFRNONAA >60 10/29/2018 0504   GFRAA >60 10/29/2018 0504   Lipase     Component Value Date/Time   LIPASE 26 10/27/2018 1234       Studies/Results: No results found.  Anti-infectives: Anti-infectives (From admission, onward)   Start     Dose/Rate Route Frequency Ordered Stop   10/28/18 1000  ciprofloxacin (CIPRO)  IVPB 400 mg  Status:  Discontinued     400 mg 200 mL/hr over 60 Minutes Intravenous Every 12 hours 10/28/18 0434 10/28/18 0947   10/28/18 1000  piperacillin-tazobactam (ZOSYN) IVPB 3.375 g     3.375 g 12.5 mL/hr over 240 Minutes Intravenous Every 8 hours 10/28/18 0947     10/28/18 0800  metroNIDAZOLE (FLAGYL) IVPB 500 mg  Status:  Discontinued     500 mg 100 mL/hr over 60 Minutes Intravenous Every 8 hours 10/27/18 2355 10/28/18 0947   10/27/18 2200  ciprofloxacin (CIPRO) IVPB 400 mg     400 mg 200 mL/hr over 60 Minutes Intravenous  Once 10/27/18 2145 10/27/18 2333   10/27/18 2200  metroNIDAZOLE (FLAGYL) IVPB 500 mg     500 mg 100 mL/hr over 60 Minutes Intravenous  Once 10/27/18 2145 10/28/18 0119       Assessment/Plan 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 he was treated again several weeks ago and discharged on augmentin. He redeveloped some pain even while on the augmentin, but worsened this past weekend when the Rx ran out. New CT shows persistent inflammatory changes.  -tolerating soft diet today -stable for DC home on oral  augmentin and some pain meds.  I expect this given his persistent inflammation.  He is weaning off of these though.  I will give him a small amount to have at home and then encouraged him to switch to tylenol and/or ibuprofen as pain continues to improve. -follow up with Dr. Sheliah HatchKinsinger on 12/18.  I will give him 14 days of abx therapy with 1 refill so Dr. Sheliah HatchKinsinger can determine if he would like to continue these even longer.  FEN -IVFs, soft diet VTE -SCDs/ heparin ID -Cipro/Flagyl 12/3-->12/3, zosyn 12/3 -->   LOS: 3 days    Letha CapeKelly E Kevyn Boquet , [facility] 10/30/2018, 8:28 AM Pager: [phone]

## 2018-11-12 DIAGNOSIS — K5780 Diverticulitis of intestine, part unspecified, with perforation and abscess without bleeding: Secondary | ICD-10-CM | POA: Diagnosis not present

## 2020-01-26 ENCOUNTER — Encounter: Payer: Self-pay | Admitting: Family Medicine

## 2020-01-26 ENCOUNTER — Ambulatory Visit: Payer: Self-pay

## 2020-01-26 ENCOUNTER — Other Ambulatory Visit: Payer: Self-pay

## 2020-01-26 ENCOUNTER — Ambulatory Visit: Payer: Commercial Managed Care - PPO | Admitting: Family Medicine

## 2020-01-26 VITALS — BP 156/114 | HR 97 | Ht 71.0 in | Wt 260.0 lb

## 2020-01-26 DIAGNOSIS — M1A0720 Idiopathic chronic gout, left ankle and foot, without tophus (tophi): Secondary | ICD-10-CM | POA: Diagnosis not present

## 2020-01-26 MED ORDER — COLCHICINE 0.6 MG PO TABS: 0.6000 mg | ORAL_TABLET | Freq: Two times a day (BID) | ORAL | 2 refills | Status: DC

## 2020-01-26 MED ORDER — METHYLPREDNISOLONE ACETATE 40 MG/ML IJ SUSP
40.0000 mg | Freq: Once | INTRAMUSCULAR | Status: AC
  Administered 2020-01-26: 40 mg via INTRAMUSCULAR

## 2020-01-26 NOTE — Patient Instructions (Signed)
Nice to meet you Please try ice  Please start the colchicine. Take this until 2 days of no pain and then stop  Please let me know if the pain and redness doesn't respond to the medicine. You may have an infection that I would need to send in antibiotics for.   Please send me a message in MyChart with any questions or updates.  Please see me back in 4 weeks or sooner if needed.   --Dr. Jordan Likes

## 2020-01-26 NOTE — Progress Notes (Signed)
Paul Gilmore - 44 y.o. male MRN [id number]  Date of birth: [birth year]  SUBJECTIVE:  Including CC & ROS.  Chief Complaint  Patient presents with  . Toe Pain    left great toe    Paul Gilmore is a 44 y.o. male that is presenting with left great toe pain.  The pain started 2 days ago.  He denies any specific inciting event or trauma.  Has a history of gout.  The pain is severe and localized to the first MTP joint on the left foot.   Review of Systems See HPI   HISTORY: Past Medical, Surgical, Social, and Family History Reviewed & Updated per EMR.   Pertinent Historical Findings include:  Past Medical History:  Diagnosis Date  . Allergy   . Anxiety   . Bowel obstruction (HCC)   . Diverticulitis of large intestine with perforation and abscess 02/2018  . GERD (gastroesophageal reflux disease)   . HTN (hypertension)     Past Surgical History:  Procedure Laterality Date  . NO PAST SURGERIES      Family History  Problem Relation Age of Onset  . COPD Mother   . Diverticulitis Mother   . Alcohol abuse Father   . Hypertension Father   . Throat cancer Father   . Esophageal cancer Father   . Colon cancer Neg Hx   . Rectal cancer Neg Hx   . Stomach cancer Neg Hx     Social History   Socioeconomic History  . Marital status: Married    Spouse name: Not on file  . Number of children: 3  . Years of education: Not on file  . Highest education level: Not on file  Occupational History  . Occupation: Clinical research associate  Tobacco Use  . Smoking status: Never Smoker  . Smokeless tobacco: Former Neurosurgeon    Types: Chew  Substance and Sexual Activity  . Alcohol use: Yes    Alcohol/week: 0.0 standard drinks    Comment: moderate  . Drug use: Yes    Types: Marijuana  . Sexual activity: Yes  Other Topics Concern  . Not on file  Social History Narrative  . Not on file   Social Determinants of Health   Financial Resource Strain:   . Difficulty of Paying Living Expenses: Not on  file  Food Insecurity:   . Worried About Programme researcher, broadcasting/film/video in the Last Year: Not on file  . Ran Out of Food in the Last Year: Not on file  Transportation Needs:   . Lack of Transportation (Medical): Not on file  . Lack of Transportation (Non-Medical): Not on file  Physical Activity:   . Days of Exercise per Week: Not on file  . Minutes of Exercise per Session: Not on file  Stress:   . Feeling of Stress : Not on file  Social Connections:   . Frequency of Communication with Friends and Family: Not on file  . Frequency of Social Gatherings with Friends and Family: Not on file  . Attends Religious Services: Not on file  . Active Member of Clubs or Organizations: Not on file  . Attends Banker Meetings: Not on file  . Marital Status: Not on file  Intimate Partner Violence:   . Fear of Current or Ex-Partner: Not on file  . Emotionally Abused: Not on file  . Physically Abused: Not on file  . Sexually Abused: Not on file     PHYSICAL EXAM:  VS: BP Marland Kitchen)  156/114   Pulse 97   Ht 5\' 11"  (1.803 m)   Wt 260 lb (117.9 kg)   BMI 36.26 kg/m  Physical Exam Gen: NAD, alert, cooperative with exam, well-appearing MSK:  Left foot: Severe swelling and redness of the left first MTP joint. Pain with any palpation. Limited flexion extension. Neurovascularly intact  Limited ultrasound: Left great toe:  Severe inflammatory changes with hyperemia of the first MTP joint.  Effusion noted of the joint.  Hyperechoic changes to suggest crystalline deposition within this area.  Summary: Findings would suggest acute gout flare.  Ultrasound and interpretation by Clearance Coots, MD    ASSESSMENT & PLAN:   Chronic idiopathic gout involving toe of left foot without tophus Acute gout flare in nature.  Significant inflammatory change of the first MTP joint. -IM Depo. -Colchicine. -Would obtain uric acid at follow-up. -Counseled for updates as to confirm no signs of infection

## 2020-01-26 NOTE — Assessment & Plan Note (Signed)
Acute gout flare in nature.  Significant inflammatory change of the first MTP joint. -IM Depo. -Colchicine. -Would obtain uric acid at follow-up. -Counseled for updates as to confirm no signs of infection

## 2020-01-27 ENCOUNTER — Ambulatory Visit: Payer: BLUE CROSS/BLUE SHIELD | Admitting: Family Medicine

## 2020-04-07 ENCOUNTER — Encounter: Payer: Self-pay | Admitting: Plastic Surgery

## 2020-04-07 ENCOUNTER — Ambulatory Visit (INDEPENDENT_AMBULATORY_CARE_PROVIDER_SITE_OTHER): Payer: Commercial Managed Care - PPO | Admitting: Plastic Surgery

## 2020-04-07 ENCOUNTER — Other Ambulatory Visit: Payer: Self-pay

## 2020-04-07 VITALS — BP 173/123 | HR 94 | Temp 98.9°F | Ht 71.0 in | Wt 261.2 lb

## 2020-04-07 DIAGNOSIS — L723 Sebaceous cyst: Secondary | ICD-10-CM

## 2020-04-07 NOTE — Progress Notes (Signed)
Referring Provider [place], Jennefer Bravo, MD [place], [place],  [place] [id number]   CC:  Chief Complaint  Patient presents with  . Advice Only    for sebaceous cyst on (R) side of face      Paul Gilmore is an 44 y.o. male.  HPI: Patient presents for evaluation of a cyst of his right cheek.  Is been there for several months.  He said this started as a pimple that he squeezed and is gradually grown in size.  It is intermittently tender.  He normally has a beard in that area that is currently shaved.  He wants it to be excised.  No Known Allergies  Outpatient Encounter Medications as of 04/07/2020  Medication Sig Note  . acetaminophen (TYLENOL) 325 MG tablet Take 3 tablets (975 mg total) by mouth every 6 (six) hours as needed for mild pain (or Fever >/= 101).   Marland Kitchen albuterol (PROVENTIL HFA;VENTOLIN HFA) 108 (90 Base) MCG/ACT inhaler Inhale 1 puff into the lungs every 6 (six) hours as needed for wheezing or shortness of breath.   Marland Kitchen ibuprofen (ADVIL,MOTRIN) 200 MG tablet Take 400 mg by mouth every 6 (six) hours as needed for mild pain.    Marland Kitchen amLODipine (NORVASC) 5 MG tablet Take 1 tablet (5 mg total) by mouth daily. (Patient not taking: Reported on 04/07/2020) 10/27/2018: Needs refill  . [DISCONTINUED] bisacodyl (DULCOLAX) 10 MG suppository Place 1 suppository (10 mg total) rectally daily as needed for moderate constipation.   . [DISCONTINUED] colchicine 0.6 MG tablet Take 1 tablet (0.6 mg total) by mouth 2 (two) times daily.   . [DISCONTINUED] ondansetron (ZOFRAN) 4 MG tablet Take 1 tablet (4 mg total) by mouth every 6 (six) hours as needed for nausea.   . [DISCONTINUED] oxyCODONE (OXY IR/ROXICODONE) 5 MG immediate release tablet Take 1 tablet (5 mg total) by mouth every 6 (six) hours as needed for moderate pain.   . [DISCONTINUED] polyethylene glycol (MIRALAX / GLYCOLAX) packet Take 17 g by mouth daily.   . [DISCONTINUED] Probiotic Product (PROBIOTIC PO) Take 1 capsule by mouth  daily.   . [DISCONTINUED] psyllium (REGULOID) 0.52 g capsule Take 1.04 g by mouth every 4 (four) hours.   . [DISCONTINUED] senna-docusate (SENOKOT-S) 8.6-50 MG tablet Take 1 tablet by mouth at bedtime.    No facility-administered encounter medications on file as of 04/07/2020.     Past Medical History:  Diagnosis Date  . Allergy   . Anxiety   . Bowel obstruction ([facility])   . Diverticulitis of large intestine with perforation and abscess 02/2018  . GERD (gastroesophageal reflux disease)   . HTN (hypertension)     Past Surgical History:  Procedure Laterality Date  . NO PAST SURGERIES      Family History  Problem Relation Age of Onset  . COPD Mother   . Diverticulitis Mother   . Alcohol abuse Father   . Hypertension Father   . Throat cancer Father   . Esophageal cancer Father   . Colon cancer Neg Hx   . Rectal cancer Neg Hx   . Stomach cancer Neg Hx     Social History   Social History Narrative  . Not on file     Review of Systems General: Denies fevers, chills, weight loss CV: Denies chest pain, shortness of breath, palpitations  Physical Exam Vitals with BMI 04/07/2020 01/26/2020 10/30/2018  Height 5\' 11"  5\' 11"  -  Weight 261 lbs 3 oz 260 lbs -  BMI 36.45 36.28 -  Systolic 173 156 658  Diastolic 123 114 88  Pulse 94 97 76    General:  No acute distress,  Alert and oriented, Non-Toxic, Normal speech and affect On his right cheek he has a 3 cm diameter subcutaneous cystic mass.  It does not erode into the oral mucosa nor there any signs of change of that tissue.  There is a small erythematous punctum centrally over the cyst.  Assessment/Plan Patient has what is likely a sebaceous cyst in his right cheek.  We discussed the pros and cons of excision.  We discussed the risk that include bleeding, infection, damage to surrounding structures, need for additional procedures.  Discussed the potential for recurrence.  I discussed the location and nature of the scar which for  him would fall into his beard line.  We will plan to set this up to be done under local.  Allena Napoleon 04/07/2020, 9:32 AM

## 2020-04-18 ENCOUNTER — Encounter: Payer: Self-pay | Admitting: Plastic Surgery

## 2020-04-18 ENCOUNTER — Ambulatory Visit (INDEPENDENT_AMBULATORY_CARE_PROVIDER_SITE_OTHER): Payer: Commercial Managed Care - PPO | Admitting: Plastic Surgery

## 2020-04-18 ENCOUNTER — Other Ambulatory Visit: Payer: Self-pay

## 2020-04-18 ENCOUNTER — Other Ambulatory Visit (HOSPITAL_COMMUNITY)
Admission: RE | Admit: 2020-04-18 | Discharge: 2020-04-18 | Disposition: A | Discharge status: Home / Self Care | Payer: Commercial Managed Care - PPO | Source: Ambulatory Visit | Attending: Plastic Surgery | Admitting: Plastic Surgery

## 2020-04-18 VITALS — BP 182/125 | HR 112 | Temp 98.3°F | Ht 71.0 in | Wt 261.0 lb

## 2020-04-18 DIAGNOSIS — L723 Sebaceous cyst: Secondary | ICD-10-CM

## 2020-04-18 MED ORDER — HYDROCODONE-ACETAMINOPHEN 5-325 MG PO TABS: 1.0000 | ORAL_TABLET | Freq: Four times a day (QID) | ORAL | 0 refills | Status: DC | PRN

## 2020-04-18 NOTE — Progress Notes (Signed)
Operative Note   DATE OF OPERATION: 04/18/2020  LOCATION:    SURGICAL DEPARTMENT: Plastic Surgery  PREOPERATIVE DIAGNOSES: Right cheek cyst  POSTOPERATIVE DIAGNOSES:  same  PROCEDURE:  1. Excision of right cheek cyst measuring 4.5 cm 2. Complex closure measuring 4.5 cm  SURGEON: Ancil Linsey, MD  ANESTHESIA:  Local  COMPLICATIONS: None.   INDICATIONS FOR PROCEDURE:  The patient, Paul Gilmore is a 44 y.o. male born on 12/14/1975, is here for treatment of right cheek cyst MRN: [id number]  CONSENT:  Informed consent was obtained directly from the patient. Risks, benefits and alternatives were fully discussed. Specific risks including but not limited to bleeding, infection, hematoma, seroma, scarring, pain, infection, wound healing problems, and need for further surgery were all discussed. The patient did have an ample opportunity to have questions answered to satisfaction.   DESCRIPTION OF PROCEDURE:  Local anesthesia was administered. The patient's operative site was prepped and draped in a sterile fashion. A time out was performed and all information was confirmed to be correct.  The lesion was excised with a 15 blade.  Hemostasis was obtained.  Circumferential undermining was performed and the skin was advanced and closed in layers with interrupted buried Monocryl sutures and 4-0 Monocryl for the skin.  The lesion excised measured 4.5 cm, and the total length of closure measured 4.5 cm.    The patient tolerated the procedure well.  There were no complications.

## 2020-04-20 LAB — SURGICAL PATHOLOGY

## 2020-05-02 NOTE — Progress Notes (Deleted)
Patient is a 44 year old male who underwent excision of right cheek cyst (4.5 cm) and complex closure on 04/18/2020 with Dr. Arita Miss.  Surgical pathology: Benign epidermoid cyst.  ~ 2 weeks PO

## 2020-05-04 ENCOUNTER — Ambulatory Visit: Payer: Commercial Managed Care - PPO | Admitting: Plastic Surgery

## 2020-06-27 ENCOUNTER — Other Ambulatory Visit: Payer: Self-pay

## 2020-06-27 ENCOUNTER — Ambulatory Visit
Admission: RE | Admit: 2020-06-27 | Discharge: 2020-06-27 | Disposition: A | Discharge status: Home / Self Care | Payer: Commercial Managed Care - PPO | Source: Ambulatory Visit | Attending: Family Medicine | Admitting: Family Medicine

## 2020-06-27 ENCOUNTER — Ambulatory Visit (INDEPENDENT_AMBULATORY_CARE_PROVIDER_SITE_OTHER): Payer: Commercial Managed Care - PPO | Admitting: Family Medicine

## 2020-06-27 VITALS — BP 154/106 | Ht 71.0 in | Wt 265.0 lb

## 2020-06-27 DIAGNOSIS — M542 Cervicalgia: Secondary | ICD-10-CM

## 2020-06-27 DIAGNOSIS — M25512 Pain in left shoulder: Secondary | ICD-10-CM

## 2020-06-27 DIAGNOSIS — M79645 Pain in left finger(s): Secondary | ICD-10-CM

## 2020-06-27 MED ORDER — DICLOFENAC SODIUM 75 MG PO TBEC: 75.0000 mg | DELAYED_RELEASE_TABLET | Freq: Two times a day (BID) | ORAL | 1 refills | Status: DC

## 2020-06-27 MED ORDER — HYDROCODONE-ACETAMINOPHEN 7.5-325 MG PO TABS: 1.0000 | ORAL_TABLET | Freq: Four times a day (QID) | ORAL | 0 refills | Status: DC | PRN

## 2020-06-27 MED ORDER — CYCLOBENZAPRINE HCL 10 MG PO TABS: 10.0000 mg | ORAL_TABLET | Freq: Every evening | ORAL | 1 refills | Status: DC | PRN

## 2020-06-27 NOTE — Patient Instructions (Signed)
Get x-rays of your neck, left shoulder, and left pinky finger after you leave today. Take voltaren 75mg  twice a day with food for pain and inflammation. Stop the advil and don't take aleve either. Sling to help support the shoulder. Norco as needed for severe pain. Flexeril as needed for muscle spasms but also to help you sleep at night. Follow up with me in 1 week. Out of work in the meantime.

## 2020-06-28 ENCOUNTER — Encounter: Payer: Self-pay | Admitting: Family Medicine
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
## 2020-07-04 ENCOUNTER — Ambulatory Visit (INDEPENDENT_AMBULATORY_CARE_PROVIDER_SITE_OTHER): Payer: Commercial Managed Care - PPO | Admitting: Family Medicine

## 2020-07-04 ENCOUNTER — Encounter: Payer: Self-pay | Admitting: Family Medicine

## 2020-07-04 ENCOUNTER — Other Ambulatory Visit: Payer: Self-pay

## 2020-07-04 VITALS — BP 176/116 | Ht 71.0 in | Wt 265.0 lb

## 2020-07-04 DIAGNOSIS — M542 Cervicalgia: Secondary | ICD-10-CM

## 2020-07-04 DIAGNOSIS — M25512 Pain in left shoulder: Secondary | ICD-10-CM | POA: Diagnosis not present

## 2020-07-04 NOTE — Patient Instructions (Signed)
Wean off the medications as tolerated. Call me if you have any problems. Follow up with me in 1 month if not better as I would expect.
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
## 2020-11-16 ENCOUNTER — Emergency Department (HOSPITAL_COMMUNITY): Payer: Commercial Managed Care - PPO

## 2020-11-16 ENCOUNTER — Inpatient Hospital Stay (HOSPITAL_COMMUNITY)
Admission: EM | Admit: 2020-11-16 | Discharge: 2020-11-21 | DRG: 392 | Disposition: A | Discharge status: Home / Self Care | Payer: Commercial Managed Care - PPO | Attending: Internal Medicine | Admitting: Internal Medicine

## 2020-11-16 ENCOUNTER — Other Ambulatory Visit: Payer: Self-pay

## 2020-11-16 ENCOUNTER — Encounter (HOSPITAL_COMMUNITY): Payer: Self-pay | Admitting: Emergency Medicine

## 2020-11-16 DIAGNOSIS — I10 Essential (primary) hypertension: Secondary | ICD-10-CM | POA: Diagnosis present

## 2020-11-16 DIAGNOSIS — E669 Obesity, unspecified: Secondary | ICD-10-CM | POA: Diagnosis present

## 2020-11-16 DIAGNOSIS — Z87891 Personal history of nicotine dependence: Secondary | ICD-10-CM | POA: Diagnosis not present

## 2020-11-16 DIAGNOSIS — Z79899 Other long term (current) drug therapy: Secondary | ICD-10-CM | POA: Diagnosis not present

## 2020-11-16 DIAGNOSIS — K219 Gastro-esophageal reflux disease without esophagitis: Secondary | ICD-10-CM | POA: Diagnosis present

## 2020-11-16 DIAGNOSIS — Z20822 Contact with and (suspected) exposure to covid-19: Secondary | ICD-10-CM | POA: Diagnosis present

## 2020-11-16 DIAGNOSIS — Z8249 Family history of ischemic heart disease and other diseases of the circulatory system: Secondary | ICD-10-CM | POA: Diagnosis not present

## 2020-11-16 DIAGNOSIS — K5732 Diverticulitis of large intestine without perforation or abscess without bleeding: Principal | ICD-10-CM | POA: Diagnosis present

## 2020-11-16 DIAGNOSIS — K5792 Diverticulitis of intestine, part unspecified, without perforation or abscess without bleeding: Secondary | ICD-10-CM

## 2020-11-16 DIAGNOSIS — Z6836 Body mass index (BMI) 36.0-36.9, adult: Secondary | ICD-10-CM | POA: Diagnosis not present

## 2020-11-16 LAB — COMPREHENSIVE METABOLIC PANEL
ALT: 15 U/L (ref 0–44)
AST: 16 U/L (ref 15–41)
Albumin: 4.3 g/dL (ref 3.5–5.0)
Alkaline Phosphatase: 91 U/L (ref 38–126)
Anion gap: 9 (ref 5–15)
BUN: 15 mg/dL (ref 6–20)
CO2: 25 mmol/L (ref 22–32)
Calcium: 9.1 mg/dL (ref 8.9–10.3)
Chloride: 100 mmol/L (ref 98–111)
Creatinine, Ser: 0.93 mg/dL (ref 0.61–1.24)
GFR, Estimated: >60 mL/min (ref >60)
Glucose, Bld: 110 mg/dL — ABNORMAL HIGH (ref 70–99)
Potassium: 4.5 mmol/L (ref 3.5–5.1)
Sodium: 134 mmol/L — ABNORMAL LOW (ref 135–145)
Total Bilirubin: 1.3 mg/dL — ABNORMAL HIGH (ref 0.3–1.2)
Total Protein: 7.6 g/dL (ref 6.5–8.1)

## 2020-11-16 LAB — URINALYSIS, ROUTINE W REFLEX MICROSCOPIC
Bacteria, UA: NONE SEEN
Glucose, UA: NEGATIVE mg/dL
Hgb urine dipstick: NEGATIVE
Ketones, ur: 20 mg/dL — AB
Leukocytes,Ua: NEGATIVE
Nitrite: NEGATIVE
Protein, ur: 30 mg/dL — AB
Specific Gravity, Urine: 1.03 (ref 1.005–1.030)
pH: 5 (ref 5.0–8.0)

## 2020-11-16 LAB — CBC
HCT: 51 % (ref 39.0–52.0)
Hemoglobin: 17.8 g/dL — ABNORMAL HIGH (ref 13.0–17.0)
MCH: 30.5 pg (ref 26.0–34.0)
MCHC: 34.9 g/dL (ref 30.0–36.0)
MCV: 87.3 fL (ref 80.0–100.0)
Platelets: 373 10*3/uL (ref 150–400)
RBC: 5.84 MIL/uL — ABNORMAL HIGH (ref 4.22–5.81)
RDW: 12.4 % (ref 11.5–15.5)
WBC: 15.3 10*3/uL — ABNORMAL HIGH (ref 4.0–10.5)
nRBC: 0 % (ref 0.0–0.2)

## 2020-11-16 LAB — RESP PANEL BY RT-PCR (FLU A&B, COVID) ARPGX2
Influenza A by PCR: NEGATIVE
Influenza B by PCR: NEGATIVE
SARS Coronavirus 2 by RT PCR: NEGATIVE

## 2020-11-16 LAB — LIPASE, BLOOD: Lipase: 20 U/L (ref 11–51)

## 2020-11-16 MED ORDER — SODIUM CHLORIDE 0.9 % IV SOLN
2.0000 g | INTRAVENOUS | Status: DC
  Filled 2020-11-16: qty 20

## 2020-11-16 MED ORDER — ALBUTEROL SULFATE HFA 108 (90 BASE) MCG/ACT IN AERS: 1.0000 | INHALATION_SPRAY | Freq: Four times a day (QID) | RESPIRATORY_TRACT | Status: DC | PRN

## 2020-11-16 MED ORDER — ONDANSETRON HCL 4 MG/2ML IJ SOLN
4.0000 mg | Freq: Four times a day (QID) | INTRAMUSCULAR | Status: DC | PRN
  Administered 2020-11-16 – 2020-11-17 (×2): 4 mg via INTRAVENOUS
  Filled 2020-11-16 (×2): qty 2

## 2020-11-16 MED ORDER — HYDRALAZINE HCL 20 MG/ML IJ SOLN
10.0000 mg | Freq: Three times a day (TID) | INTRAMUSCULAR | Status: DC | PRN
  Administered 2020-11-16 – 2020-11-17 (×3): 10 mg via INTRAVENOUS
  Filled 2020-11-16 (×3): qty 1

## 2020-11-16 MED ORDER — SODIUM CHLORIDE 0.9 % IV SOLN
1000.0000 mL | INTRAVENOUS | Status: DC
  Administered 2020-11-16: 1000 mL via INTRAVENOUS

## 2020-11-16 MED ORDER — OXYCODONE HCL 5 MG PO TABS
5.0000 mg | ORAL_TABLET | ORAL | Status: DC | PRN
  Administered 2020-11-16 – 2020-11-17 (×4): 5 mg via ORAL
  Filled 2020-11-16 (×4): qty 1

## 2020-11-16 MED ORDER — SODIUM CHLORIDE 0.9 % IV BOLUS (SEPSIS)
1000.0000 mL | Freq: Once | INTRAVENOUS | Status: AC
  Administered 2020-11-16: 1000 mL via INTRAVENOUS

## 2020-11-16 MED ORDER — IOHEXOL 300 MG/ML  SOLN
100.0000 mL | Freq: Once | INTRAMUSCULAR | Status: AC | PRN
  Administered 2020-11-16: 100 mL via INTRAVENOUS

## 2020-11-16 MED ORDER — ACETAMINOPHEN 325 MG PO TABS
650.0000 mg | ORAL_TABLET | Freq: Four times a day (QID) | ORAL | Status: DC | PRN
  Administered 2020-11-18 (×2): 650 mg via ORAL
  Filled 2020-11-16 (×2): qty 2

## 2020-11-16 MED ORDER — ACETAMINOPHEN 650 MG RE SUPP: 650.0000 mg | Freq: Four times a day (QID) | RECTAL | Status: DC | PRN

## 2020-11-16 MED ORDER — ENOXAPARIN SODIUM 60 MG/0.6ML ~~LOC~~ SOLN
60.0000 mg | SUBCUTANEOUS | Status: DC
  Administered 2020-11-16 – 2020-11-20 (×5): 60 mg via SUBCUTANEOUS
  Filled 2020-11-16 (×5): qty 0.6

## 2020-11-16 MED ORDER — HYDROMORPHONE HCL 1 MG/ML IJ SOLN
1.0000 mg | Freq: Once | INTRAMUSCULAR | Status: AC
  Administered 2020-11-16: 1 mg via INTRAVENOUS
  Filled 2020-11-16: qty 1

## 2020-11-16 MED ORDER — METRONIDAZOLE 500 MG PO TABS
500.0000 mg | ORAL_TABLET | Freq: Three times a day (TID) | ORAL | Status: DC
  Administered 2020-11-16 – 2020-11-17 (×3): 500 mg via ORAL
  Filled 2020-11-16 (×3): qty 1

## 2020-11-16 MED ORDER — AMLODIPINE BESYLATE 5 MG PO TABS
10.0000 mg | ORAL_TABLET | Freq: Once | ORAL | Status: AC
  Administered 2020-11-16: 10 mg via ORAL
  Filled 2020-11-16: qty 2

## 2020-11-16 MED ORDER — AMLODIPINE BESYLATE 10 MG PO TABS
10.0000 mg | ORAL_TABLET | Freq: Every day | ORAL | Status: DC
  Administered 2020-11-17 – 2020-11-21 (×5): 10 mg via ORAL
  Filled 2020-11-16 (×5): qty 1

## 2020-11-16 MED ORDER — HYDROMORPHONE HCL 1 MG/ML IJ SOLN
1.0000 mg | INTRAMUSCULAR | Status: DC | PRN
  Administered 2020-11-16 – 2020-11-17 (×5): 1 mg via INTRAVENOUS
  Filled 2020-11-16 (×5): qty 1

## 2020-11-16 MED ORDER — ONDANSETRON HCL 4 MG/2ML IJ SOLN
4.0000 mg | Freq: Once | INTRAMUSCULAR | Status: AC
  Administered 2020-11-16: 4 mg via INTRAVENOUS
  Filled 2020-11-16: qty 2

## 2020-11-16 MED ORDER — SODIUM CHLORIDE 0.9 % IV SOLN
2.0000 g | Freq: Once | INTRAVENOUS | Status: AC
  Administered 2020-11-16: 2 g via INTRAVENOUS
  Filled 2020-11-16: qty 20

## 2020-11-16 MED ORDER — SIMETHICONE 40 MG/0.6ML PO SUSP
80.0000 mg | Freq: Four times a day (QID) | ORAL | Status: DC | PRN
  Administered 2020-11-16 – 2020-11-18 (×5): 80 mg via ORAL
  Filled 2020-11-16 (×8): qty 1.2

## 2020-11-16 MED ORDER — LOSARTAN POTASSIUM 50 MG PO TABS
50.0000 mg | ORAL_TABLET | Freq: Every day | ORAL | Status: DC
  Administered 2020-11-16 – 2020-11-21 (×6): 50 mg via ORAL
  Filled 2020-11-16 (×6): qty 1

## 2020-11-16 MED ORDER — DOCUSATE SODIUM 100 MG PO CAPS
100.0000 mg | ORAL_CAPSULE | Freq: Two times a day (BID) | ORAL | Status: DC
  Administered 2020-11-16 – 2020-11-21 (×9): 100 mg via ORAL
  Filled 2020-11-16 (×9): qty 1

## 2020-11-16 MED ORDER — ONDANSETRON HCL 4 MG PO TABS: 4.0000 mg | ORAL_TABLET | Freq: Four times a day (QID) | ORAL | Status: DC | PRN

## 2020-11-16 NOTE — ED Notes (Signed)
Report given to Ashleigh RN

## 2020-11-16 NOTE — H&P (Signed)
History and Physical    Paul Gilmore VOH:[id number] DOB: [birth year] DOA: 11/16/2020  PCP: Patient, No Pcp Per  Patient coming from: Home  Chief Complaint: abdominal pain.  HPI: Paul Gilmore is a 44 y.o. male with medical history significant of HTN. Presenting with LLQ abdominal pain. Reports that it started about 4 days ago. He immediately recognized it as being similar to his previous bout with diverticulitis. So, he changed his diet to liquid diet and took APAP. This did not help. His symptoms worsened through this morning, so he decided to come to the ED.    ED Course: CT ab/pelvis c/w acute diverticulitis. Started on rocephin, flagyl. TRH called for admission.   Review of Systems:  Denies fever, chills, CP, dyspnea, diarrhea. Review of systems is otherwise negative for all not mentioned in HPI.   PMHx Past Medical History:  Diagnosis Date   Allergy    Anxiety    Bowel obstruction (HCC)    Diverticulitis of large intestine with perforation and abscess 02/2018   GERD (gastroesophageal reflux disease)    HTN (hypertension)     PSHx Past Surgical History:  Procedure Laterality Date   NO PAST SURGERIES      SocHx  reports that he has never smoked. He quit smokeless tobacco use about 6 years ago.  His smokeless tobacco use included chew. He reports current alcohol use. He reports current drug use. Drug: Marijuana. 1/2 can chewing tobacco/day. 1 - 2 rum/coke night.  No Known Allergies  FamHx Family History  Problem Relation Age of Onset   COPD Mother    Diverticulitis Mother    Alcohol abuse Father    Hypertension Father    Throat cancer Father    Esophageal cancer Father    Colon cancer Neg Hx    Rectal cancer Neg Hx    Stomach cancer Neg Hx     Prior to Admission medications   Medication Sig Start Date End Date Taking? Authorizing Provider  acetaminophen (TYLENOL) 325 MG tablet Take 3 tablets (975 mg total) by mouth every 6 (six) hours as needed  for mild pain (or Fever >/= 101). 10/30/18   Barnetta Chapel, PA-C  albuterol (PROVENTIL HFA;VENTOLIN HFA) 108 (90 Base) MCG/ACT inhaler Inhale 1 puff into the lungs every 6 (six) hours as needed for wheezing or shortness of breath.    [provider]  amLODipine (NORVASC) 5 MG tablet Take 1 tablet (5 mg total) by mouth daily. Patient not taking: Reported on 04/07/2020 06/11/18   Benancio Deeds, MD  cyclobenzaprine (FLEXERIL) 10 MG tablet Take 1 tablet (10 mg total) by mouth at bedtime as needed for muscle spasms. 06/27/20   Hudnall, Azucena Fallen, MD  diclofenac (VOLTAREN) 75 MG EC tablet Take 1 tablet (75 mg total) by mouth 2 (two) times daily. 06/27/20   Hudnall, Azucena Fallen, MD  HYDROcodone-acetaminophen (NORCO) 7.5-325 MG tablet Take 1 tablet by mouth every 6 (six) hours as needed for moderate pain. 06/27/20   Lenda Kelp, MD    Physical Exam: Vitals:   11/16/20 0930 11/16/20 0945 11/16/20 1000 11/16/20 1144  BP: (!) 177/119  (!) 174/123 (!) 185/125  Pulse: 87 94 97 86  Resp: 12 (!) 9 17 16   Temp:      TempSrc:      SpO2: 100% 99% 97% 95%  Weight:      Height:        General: 44 y.o. male resting in bed in NAD Eyes: PERRL,  normal sclera ENMT: Nares patent w/o discharge, orophaynx clear, dentition normal, ears w/o discharge/lesions/ulcers Neck: Supple, trachea midline Cardiovascular: RRR, +S1, S2, no m/g/r, equal pulses throughout Respiratory: CTABL, no w/r/r, normal WOB GI: BS+, soft, ND, LLQ TTP, no masses noted, no organomegaly noted MSK: No e/c/c Skin: No rashes, bruises, ulcerations noted Neuro: A&O x 3, no focal deficits Psyc: Appropriate interaction and affect, calm/cooperative  Labs on Admission: I have personally reviewed following labs and imaging studies  CBC: Recent Labs  Lab 11/16/20 0858  WBC 15.3*  HGB 17.8*  HCT 51.0  MCV 87.3  PLT 373   Basic Metabolic Panel: Recent Labs  Lab 11/16/20 0858  NA 134*  K 4.5  CL 100  CO2 25  GLUCOSE 110*  BUN  15  CREATININE 0.93  CALCIUM 9.1   GFR: Estimated Creatinine Clearance: 132.4 mL/min (by C-G formula based on SCr of 0.93 mg/dL). Liver Function Tests: Recent Labs  Lab 11/16/20 0858  AST 16  ALT 15  ALKPHOS 91  BILITOT 1.3*  PROT 7.6  ALBUMIN 4.3   Recent Labs  Lab 11/16/20 0858  LIPASE 20   No results for input(s): AMMONIA in the last 168 hours. Coagulation Profile: No results for input(s): INR, PROTIME in the last 168 hours. Cardiac Enzymes: No results for input(s): CKTOTAL, CKMB, CKMBINDEX, TROPONINI in the last 168 hours. BNP (last 3 results) No results for input(s): PROBNP in the last 8760 hours. HbA1C: No results for input(s): HGBA1C in the last 72 hours. CBG: No results for input(s): GLUCAP in the last 168 hours. Lipid Profile: No results for input(s): CHOL, HDL, LDLCALC, TRIG, CHOLHDL, LDLDIRECT in the last 72 hours. Thyroid Function Tests: No results for input(s): TSH, T4TOTAL, FREET4, T3FREE, THYROIDAB in the last 72 hours. Anemia Panel: No results for input(s): VITAMINB12, FOLATE, FERRITIN, TIBC, IRON, RETICCTPCT in the last 72 hours. Urine analysis:    Component Value Date/Time   COLORURINE BROWN (A) 11/16/2020 0954   APPEARANCEUR CLEAR 11/16/2020 0954   LABSPEC 1.030 11/16/2020 0954   PHURINE 5.0 11/16/2020 0954   GLUCOSEU NEGATIVE 11/16/2020 0954   HGBUR NEGATIVE 11/16/2020 0954   BILIRUBINUR SMALL (A) 11/16/2020 0954   KETONESUR 20 (A) 11/16/2020 0954   PROTEINUR 30 (A) 11/16/2020 0954   NITRITE NEGATIVE 11/16/2020 0954   LEUKOCYTESUR NEGATIVE 11/16/2020 0954    Radiological Exams on Admission: CT ABDOMEN PELVIS W CONTRAST  Result Date: 11/16/2020 CLINICAL DATA:  Left lower abdominal pain. EXAM: CT ABDOMEN AND PELVIS WITH CONTRAST TECHNIQUE: Multidetector CT imaging of the abdomen and pelvis was performed using the standard protocol following bolus administration of intravenous contrast. CONTRAST:  OMNIPAQUE IOHEXOL 300 MG/ML  SOLN  COMPARISON:  October 27, 2018 FINDINGS: Lower chest: Lung bases are clear. There are occasional foci of coronary artery calcification. Hepatobiliary: No focal liver lesions are appreciable. The gallbladder wall is not appreciably thickened. There is no biliary duct dilatation. Pancreas: There is no pancreatic mass or inflammatory focus. Spleen: No splenic lesions are evident. Adrenals/Urinary Tract: Adrenals bilaterally appear normal. There is no appreciable renal mass or hydronephrosis on either side. There is no appreciable renal or ureteral calculus on either side. Urinary bladder is midline with wall thickness within normal limits. Stomach/Bowel: There is wall thickening in the mid to distal sigmoid colon region with mild soft tissue stranding in this area. Proximal to this area of wall thickening, there is mesenteric thickening along the colon at the junction of the descending and sigmoid regions. There are occasional  diverticula in these areas. There is somewhat generalized colonic dilatation proximal to this area of more distal inflammation. There is no appreciable small bowel dilatation. Most loops of small bowel are fluid-filled. There is no well-defined bowel obstruction. The terminal ileum appears unremarkable. There is no appreciable free air or portal venous air evident. Vascular/Lymphatic: There is no abdominal aortic aneurysm. There is minimal aortic atherosclerosis. Major mesenteric vessels appear patent. Major venous structures appear patent. There is no evident adenopathy in the abdomen or pelvis. Reproductive: There are prostatic calculi. Prostate and seminal vesicles are normal in size and contour. Other: The appendix appears normal. There is no evident abscess or ascites in the abdomen or pelvis. There is slight fat in the umbilicus. Musculoskeletal: There is stable anterior wedging of the T12 vertebral body. No blastic or lytic bone lesions are appreciable. No intramuscular lesions evident.  IMPRESSION: 1. There is soft tissue/mesenteric stranding in the region of the junction of the descending colon and sigmoid colon with involvement of the proximal sigmoid colon. No well-defined fluid collection. There is wall thickening in the mid to distal sigmoid colon. No abscess or perforation evident. Appearance is consistent with a degree of colitis/diverticulitis involving much of the sigmoid colon and distal most aspect of descending colon. It should be noted that given the wall thickening in the mid to distal descending colon, an inflammatory neoplasm could be responsible for the other changes. It may be prudent to consider direct visualization of the sigmoid colon after treatment of acute inflammatory changes to assess for possible underlying neoplasm in this area. 2. Relative dilatation of the remainder of the colon. Colonic wall does not appear appreciably thickened more proximally. Relatively mild stool volume present. 3. Most small bowel loops are fluid-filled which may indicate a degree of enteritis/ileus. No small bowel obstruction. Terminal ileum region appears normal. No small bowel or caval area 4. No abscess in the abdomen or pelvis. Appendix appears normal. No free air. 5.  Stable anterior wedging of the T12 vertebral body. Electronically Signed   By: Bretta Bang III M.D.   On: 11/16/2020 11:16   Assessment/Plan Acute diverticulitis     - admit to inpt, med-surg     - continue flagyl, rocephin     - pain control, fluids, NPO except ice chips/sips w/ meds  HTN     - amlodipine, cozaar     - PRN hydralazine  DVT prophylaxis: lovenox  Code Status: FULL  Family Communication: With wife at bedside.   Consults called: None.   Status is: Inpatient  Remains inpatient appropriate because:Inpatient level of care appropriate due to severity of illness   Dispo: The patient is from: Home              Anticipated d/c is to: Home              Anticipated d/c date is: 3 days               Patient currently is not medically stable to d/c.  Teddy Spike DO [facility]  If 7PM-7AM, please contact night-coverage [email or web address]  11/16/2020, 12:04 PM

## 2020-11-16 NOTE — Plan of Care (Signed)

## 2020-11-16 NOTE — ED Provider Notes (Signed)
[facility]-EMERGENCY DEPT Provider Note   CSN: [id number] Arrival date & time: 11/16/20  [id number]     History Chief Complaint  Patient presents with  . Abdominal Pain    Paul Gilmore is a 44 y.o. male.  HPI   Pt presents to the ED for abdominal pain for several days.  Pt has had diverticulitis before.  Pt feels this is similar.  THe pain is in the lower left abdomen.  NO vomiting, diarrhea or fever.  Pt has been trying to modify diet to see if that would help but it has not helped.  Patient states when he had diverticulitis before he had to be admitted to the hospital.  Past Medical History:  Diagnosis Date  . Allergy   . Anxiety   . Bowel obstruction (HCC)   . Diverticulitis of large intestine with perforation and abscess 02/2018  . GERD (gastroesophageal reflux disease)   . HTN (hypertension)     Patient Active Problem List   Diagnosis Date Noted  . Chronic idiopathic gout involving toe of left foot without tophus 01/26/2020  . Essential hypertension 10/27/2018  . Diverticulitis 10/09/2018  . Thoracic back pain 04/01/2018  . Diverticulitis of intestine with abscess 03/22/2018  . Obesity, Class III, BMI 40-49.9 (morbid obesity) (HCC) 03/08/2018  . Perforation of sigmoid colon due to diverticulitis 03/03/2018  . Hyperglycemia 03/03/2018  . Right foot pain 06/14/2017  . Great toe pain, left 03/04/2017  . Lower back injury 09/24/2016  . Hand laceration 09/24/2016  . Neck pain 02/08/2014    Past Surgical History:  Procedure Laterality Date  . NO PAST SURGERIES         Family History  Problem Relation Age of Onset  . COPD Mother   . Diverticulitis Mother   . Alcohol abuse Father   . Hypertension Father   . Throat cancer Father   . Esophageal cancer Father   . Colon cancer Neg Hx   . Rectal cancer Neg Hx   . Stomach cancer Neg Hx     Social History   Tobacco Use  . Smoking status: Never Smoker  . Smokeless tobacco: Former NeurosurgeonUser     Types: Engineer, drillingChew  Vaping Use  . Vaping Use: Never used  Substance Use Topics  . Alcohol use: Yes    Alcohol/week: 0.0 standard drinks    Comment: moderate  . Drug use: Yes    Types: Marijuana    Home Medications Prior to Admission medications   Medication Sig Start Date End Date Taking? Authorizing Provider  acetaminophen (TYLENOL) 325 MG tablet Take 3 tablets (975 mg total) by mouth every 6 (six) hours as needed for mild pain (or Fever >/= 101). 10/30/18   Barnetta Chapelsborne, Kelly, PA-C  albuterol (PROVENTIL HFA;VENTOLIN HFA) 108 (90 Base) MCG/ACT inhaler Inhale 1 puff into the lungs every 6 (six) hours as needed for wheezing or shortness of breath.    [provider]  amLODipine (NORVASC) 5 MG tablet Take 1 tablet (5 mg total) by mouth daily. Patient not taking: Reported on 04/07/2020 06/11/18   Benancio DeedsArmbruster, Steven P, MD  cyclobenzaprine (FLEXERIL) 10 MG tablet Take 1 tablet (10 mg total) by mouth at bedtime as needed for muscle spasms. 06/27/20   Hudnall, Azucena FallenShane R, MD  diclofenac (VOLTAREN) 75 MG EC tablet Take 1 tablet (75 mg total) by mouth 2 (two) times daily. 06/27/20   Hudnall, Azucena FallenShane R, MD  HYDROcodone-acetaminophen (NORCO) 7.5-325 MG tablet Take 1 tablet by mouth every  6 (six) hours as needed for moderate pain. 06/27/20   Lenda Kelp, MD    Allergies    Patient has no known allergies.  Review of Systems   Review of Systems  All other systems reviewed and are negative.   Physical Exam Updated Vital Signs BP (!) 185/125 (BP Location: Right Arm)   Pulse 86   Temp 98 F (36.7 C) (Oral)   Resp 16   Ht 1.803 m (5\' 11" )   Wt 120.2 kg   SpO2 95%   BMI 36.96 kg/m   Physical Exam Vitals and nursing note reviewed.  Constitutional:      General: He is not in acute distress.    Appearance: He is well-developed and well-nourished.  HENT:     Head: Normocephalic and atraumatic.     Right Ear: External ear normal.     Left Ear: External ear normal.  Eyes:     General: No scleral  icterus.       Right eye: No discharge.        Left eye: No discharge.     Conjunctiva/sclera: Conjunctivae normal.  Neck:     Trachea: No tracheal deviation.  Cardiovascular:     Rate and Rhythm: Normal rate and regular rhythm.     Pulses: Intact distal pulses.  Pulmonary:     Effort: Pulmonary effort is normal. No respiratory distress.     Breath sounds: Normal breath sounds. No stridor. No wheezing or rales.  Abdominal:     General: Bowel sounds are normal. There is no distension.     Palpations: Abdomen is soft.     Tenderness: There is abdominal tenderness in the left lower quadrant. There is guarding. There is no rebound.  Musculoskeletal:        General: No tenderness or edema.     Cervical back: Neck supple.  Skin:    General: Skin is warm and dry.     Findings: No rash.  Neurological:     Mental Status: He is alert.     Cranial Nerves: No cranial nerve deficit (no facial droop, extraocular movements intact, no slurred speech).     Sensory: No sensory deficit.     Motor: No abnormal muscle tone or seizure activity.     Coordination: Coordination normal.     Deep Tendon Reflexes: Strength normal.  Psychiatric:        Mood and Affect: Mood and affect normal.     ED Results / Procedures / Treatments   Labs (all labs ordered are listed, but only abnormal results are displayed) Labs Reviewed  COMPREHENSIVE METABOLIC PANEL - Abnormal; Notable for the following components:      Result Value   Sodium 134 (*)    Glucose, Bld 110 (*)    Total Bilirubin 1.3 (*)    All other components within normal limits  CBC - Abnormal; Notable for the following components:   WBC 15.3 (*)    RBC 5.84 (*)    Hemoglobin 17.8 (*)    All other components within normal limits  URINALYSIS, ROUTINE W REFLEX MICROSCOPIC - Abnormal; Notable for the following components:   Color, Urine BROWN (*)    Bilirubin Urine SMALL (*)    Ketones, ur 20 (*)    Protein, ur 30 (*)    All other components  within normal limits  LIPASE, BLOOD    EKG None  Radiology CT ABDOMEN PELVIS W CONTRAST  Result Date: 11/16/2020 CLINICAL DATA:  Left lower abdominal pain. EXAM: CT ABDOMEN AND PELVIS WITH CONTRAST TECHNIQUE: Multidetector CT imaging of the abdomen and pelvis was performed using the standard protocol following bolus administration of intravenous contrast. CONTRAST:  OMNIPAQUE IOHEXOL 300 MG/ML  SOLN COMPARISON:  October 27, 2018 FINDINGS: Lower chest: Lung bases are clear. There are occasional foci of coronary artery calcification. Hepatobiliary: No focal liver lesions are appreciable. The gallbladder wall is not appreciably thickened. There is no biliary duct dilatation. Pancreas: There is no pancreatic mass or inflammatory focus. Spleen: No splenic lesions are evident. Adrenals/Urinary Tract: Adrenals bilaterally appear normal. There is no appreciable renal mass or hydronephrosis on either side. There is no appreciable renal or ureteral calculus on either side. Urinary bladder is midline with wall thickness within normal limits. Stomach/Bowel: There is wall thickening in the mid to distal sigmoid colon region with mild soft tissue stranding in this area. Proximal to this area of wall thickening, there is mesenteric thickening along the colon at the junction of the descending and sigmoid regions. There are occasional diverticula in these areas. There is somewhat generalized colonic dilatation proximal to this area of more distal inflammation. There is no appreciable small bowel dilatation. Most loops of small bowel are fluid-filled. There is no well-defined bowel obstruction. The terminal ileum appears unremarkable. There is no appreciable free air or portal venous air evident. Vascular/Lymphatic: There is no abdominal aortic aneurysm. There is minimal aortic atherosclerosis. Major mesenteric vessels appear patent. Major venous structures appear patent. There is no evident adenopathy in the abdomen  or pelvis. Reproductive: There are prostatic calculi. Prostate and seminal vesicles are normal in size and contour. Other: The appendix appears normal. There is no evident abscess or ascites in the abdomen or pelvis. There is slight fat in the umbilicus. Musculoskeletal: There is stable anterior wedging of the T12 vertebral body. No blastic or lytic bone lesions are appreciable. No intramuscular lesions evident. IMPRESSION: 1. There is soft tissue/mesenteric stranding in the region of the junction of the descending colon and sigmoid colon with involvement of the proximal sigmoid colon. No well-defined fluid collection. There is wall thickening in the mid to distal sigmoid colon. No abscess or perforation evident. Appearance is consistent with a degree of colitis/diverticulitis involving much of the sigmoid colon and distal most aspect of descending colon. It should be noted that given the wall thickening in the mid to distal descending colon, an inflammatory neoplasm could be responsible for the other changes. It may be prudent to consider direct visualization of the sigmoid colon after treatment of acute inflammatory changes to assess for possible underlying neoplasm in this area. 2. Relative dilatation of the remainder of the colon. Colonic wall does not appear appreciably thickened more proximally. Relatively mild stool volume present. 3. Most small bowel loops are fluid-filled which may indicate a degree of enteritis/ileus. No small bowel obstruction. Terminal ileum region appears normal. No small bowel or caval area 4. No abscess in the abdomen or pelvis. Appendix appears normal. No free air. 5.  Stable anterior wedging of the T12 vertebral body. Electronically Signed   By: Bretta Bang III M.D.   On: 11/16/2020 11:16    Procedures .Critical Care Performed by: Linwood Dibbles, MD Authorized by: Linwood Dibbles, MD   Critical care provider statement:    Critical care time (minutes):  35   Critical care was  time spent personally by me on the following activities:  Discussions with consultants, evaluation of patient's response to treatment, examination of  patient, ordering and performing treatments and interventions, ordering and review of laboratory studies, ordering and review of radiographic studies, pulse oximetry, re-evaluation of patient's condition, obtaining history from patient or surrogate and review of old charts   (including critical care time)  Medications Ordered in ED Medications  sodium chloride 0.9 % bolus 1,000 mL (0 mLs Intravenous Stopped 11/16/20 0953)    Followed by  0.9 %  sodium chloride infusion (1,000 mLs Intravenous New Bag/Given 11/16/20 0952)  cefTRIAXone (ROCEPHIN) 2 g in sodium chloride 0.9 % 100 mL IVPB (has no administration in time range)    And  metroNIDAZOLE (FLAGYL) tablet 500 mg (has no administration in time range)  amLODipine (NORVASC) tablet 10 mg (has no administration in time range)  HYDROmorphone (DILAUDID) injection 1 mg (1 mg Intravenous Given 11/16/20 0852)  ondansetron (ZOFRAN) injection 4 mg (4 mg Intravenous Given 11/16/20 0853)  HYDROmorphone (DILAUDID) injection 1 mg (1 mg Intravenous Given 11/16/20 1118)  iohexol (OMNIPAQUE) 300 MG/ML solution 100 mL (100 mLs Intravenous Contrast Given 11/16/20 1056)    ED Course  I have reviewed the triage vital signs and the nursing notes.  Pertinent labs & imaging results that were available during my care of the patient were reviewed by me and considered in my medical decision making (see chart for details).  Clinical Course as of 11/16/20 1154  Wed Nov 16, 2020  1023 Labs notable for elevated WBC [JK]    Clinical Course User Index [JK] Linwood Dibbles, MD   MDM Rules/Calculators/A&P                          Patient presented to ED with complaints of lower abdominal pain.  Patient has prior history of diverticulitis.  Patient's laboratory tests were notable for elevated white blood cell count.  Urine  also suggested dehydration.  CT scan does show confirm diverticulitis/colitis.  No evidence of abscess or free air.  Patient pain has improved with treatment.  I will start her on IV antibiotics.  Considering the extensive nature and his degree of pain I think he warrants inpatient admission.  Patient also noted to be hypertensive.  He does have history of hypertension.  Suspect this could be combination of him not being able to take his medications as well as his pain.  I have ordered a dose of his Norvasc.  We will continue to monitor.  I will consult the medical service for further treatment admission. Final Clinical Impression(s) / ED Diagnoses Final diagnoses:  Diverticulitis  Hypertension, unspecified type      Linwood Dibbles, MD 11/16/20 1154

## 2020-11-16 NOTE — Progress Notes (Signed)
Patient has vomitted 2x. MD paged for prn gas and IV pain med.

## 2020-11-16 NOTE — ED Triage Notes (Signed)
Patient states he has been having cramps and unbearable abdominal pain x3 days, states it is a flare up of his diverticulitis. Denies N/V/D. Has been on a liquid diet w/ no relief.

## 2020-11-17 ENCOUNTER — Encounter (HOSPITAL_COMMUNITY): Payer: Self-pay | Admitting: Internal Medicine

## 2020-11-17 DIAGNOSIS — K5792 Diverticulitis of intestine, part unspecified, without perforation or abscess without bleeding: Secondary | ICD-10-CM | POA: Diagnosis not present

## 2020-11-17 LAB — CBC
HCT: 54 % — ABNORMAL HIGH (ref 39.0–52.0)
Hemoglobin: 18.4 g/dL — ABNORMAL HIGH (ref 13.0–17.0)
MCH: 30.1 pg (ref 26.0–34.0)
MCHC: 34.1 g/dL (ref 30.0–36.0)
MCV: 88.2 fL (ref 80.0–100.0)
Platelets: 466 10*3/uL — ABNORMAL HIGH (ref 150–400)
RBC: 6.12 MIL/uL — ABNORMAL HIGH (ref 4.22–5.81)
RDW: 12.6 % (ref 11.5–15.5)
WBC: 26.9 10*3/uL — ABNORMAL HIGH (ref 4.0–10.5)
nRBC: 0 % (ref 0.0–0.2)

## 2020-11-17 LAB — COMPREHENSIVE METABOLIC PANEL
ALT: 14 U/L (ref 0–44)
AST: 16 U/L (ref 15–41)
Albumin: 3.6 g/dL (ref 3.5–5.0)
Alkaline Phosphatase: 77 U/L (ref 38–126)
Anion gap: 11 (ref 5–15)
BUN: 14 mg/dL (ref 6–20)
CO2: 22 mmol/L (ref 22–32)
Calcium: 8.6 mg/dL — ABNORMAL LOW (ref 8.9–10.3)
Chloride: 99 mmol/L (ref 98–111)
Creatinine, Ser: 0.76 mg/dL (ref 0.61–1.24)
GFR, Estimated: >60 mL/min (ref >60)
Glucose, Bld: 117 mg/dL — ABNORMAL HIGH (ref 70–99)
Potassium: 4 mmol/L (ref 3.5–5.1)
Sodium: 132 mmol/L — ABNORMAL LOW (ref 135–145)
Total Bilirubin: 0.8 mg/dL (ref 0.3–1.2)
Total Protein: 6.8 g/dL (ref 6.5–8.1)

## 2020-11-17 LAB — HIV ANTIBODY (ROUTINE TESTING W REFLEX): HIV Screen 4th Generation wRfx: NONREACTIVE

## 2020-11-17 MED ORDER — MORPHINE SULFATE (PF) 2 MG/ML IV SOLN
2.0000 mg | INTRAVENOUS | Status: DC | PRN
  Administered 2020-11-17 (×4): 4 mg via INTRAVENOUS
  Administered 2020-11-17: 2 mg via INTRAVENOUS
  Administered 2020-11-17 – 2020-11-18 (×4): 4 mg via INTRAVENOUS
  Administered 2020-11-18: 2 mg via INTRAVENOUS
  Administered 2020-11-18 – 2020-11-19 (×7): 4 mg via INTRAVENOUS
  Administered 2020-11-19: 2 mg via INTRAVENOUS
  Administered 2020-11-19 (×6): 4 mg via INTRAVENOUS
  Administered 2020-11-20: 2 mg via INTRAVENOUS
  Administered 2020-11-20: 4 mg via INTRAVENOUS
  Administered 2020-11-20: 2 mg via INTRAVENOUS
  Administered 2020-11-20 – 2020-11-21 (×3): 4 mg via INTRAVENOUS
  Administered 2020-11-21: 2 mg via INTRAVENOUS
  Administered 2020-11-21: 4 mg via INTRAVENOUS
  Filled 2020-11-17 (×6): qty 2
  Filled 2020-11-17: qty 1
  Filled 2020-11-17: qty 2
  Filled 2020-11-17: qty 1
  Filled 2020-11-17 (×7): qty 2
  Filled 2020-11-17: qty 1
  Filled 2020-11-17 (×3): qty 2
  Filled 2020-11-17 (×3): qty 1
  Filled 2020-11-17 (×3): qty 2
  Filled 2020-11-17: qty 1
  Filled 2020-11-17 (×6): qty 2

## 2020-11-17 MED ORDER — SODIUM CHLORIDE 0.9 % IV SOLN
1000.0000 mL | INTRAVENOUS | Status: DC
  Administered 2020-11-17: 1000 mL via INTRAVENOUS

## 2020-11-17 MED ORDER — KETOROLAC TROMETHAMINE 15 MG/ML IJ SOLN
15.0000 mg | Freq: Three times a day (TID) | INTRAMUSCULAR | Status: DC
  Administered 2020-11-17: 15 mg via INTRAVENOUS
  Filled 2020-11-17: qty 1

## 2020-11-17 MED ORDER — TRAMADOL HCL 50 MG PO TABS
50.0000 mg | ORAL_TABLET | Freq: Four times a day (QID) | ORAL | Status: DC | PRN
  Administered 2020-11-18: 100 mg via ORAL
  Administered 2020-11-18: 50 mg via ORAL
  Administered 2020-11-20 (×2): 100 mg via ORAL
  Filled 2020-11-17: qty 2
  Filled 2020-11-17: qty 1
  Filled 2020-11-17 (×3): qty 2

## 2020-11-17 MED ORDER — PIPERACILLIN-TAZOBACTAM 3.375 G IVPB 30 MIN
3.3750 g | Freq: Three times a day (TID) | INTRAVENOUS | Status: DC
  Administered 2020-11-17 – 2020-11-21 (×12): 3.375 g via INTRAVENOUS
  Filled 2020-11-17 (×28): qty 50

## 2020-11-17 MED ORDER — LABETALOL HCL 5 MG/ML IV SOLN
10.0000 mg | INTRAVENOUS | Status: DC | PRN
  Filled 2020-11-17 (×2): qty 4

## 2020-11-17 MED ORDER — FENTANYL CITRATE (PF) 100 MCG/2ML IJ SOLN
25.0000 ug | INTRAMUSCULAR | Status: DC | PRN
  Administered 2020-11-17: 25 ug via INTRAVENOUS
  Filled 2020-11-17: qty 2

## 2020-11-17 NOTE — Progress Notes (Signed)
PROGRESS NOTE    Paul Gilmore  IFO:[id number] DOB: Oct 13, 1976 DOA: 11/16/2020 PCP: Patient, No Pcp Per     Brief Narrative:  Paul Gilmore is a 44 y.o. male with medical history significant of HTN and history of diverticulitis. He presented with LLQ abdominal pain. Reports that it started about 4 days ago. He immediately recognized it as being similar to his previous bout with diverticulitis. So he changed his diet to liquid diet and took APAP. This did not help. His symptoms worsened through this morning, so he decided to come to the ED.  CT abdomen pelvis revealed acute diverticulitis.  He was started on empiric Rocephin, Flagyl and admitted to the hospital.  New events last 24 hours / Subjective: Continues to have severe left lower quadrant pain, did not help with Dilaudid.  Admits to diarrhea.  Assessment & Plan:   Active Problems:   Diverticulitis   Recurrent diverticulitis -CT abdomen pelvis: soft tissue/mesenteric stranding in the region of the junction of the descending colon and sigmoid colon with involvement of the proximal sigmoid colon. No well-defined fluid collection. There is wall thickening in the mid to distal sigmoid colon. No abscess or perforation evident.  -Flagyl and Rocephin --> Zosyn  -WBC trending upward today.  General surgery consulted.  Essential hypertension -Continue amlodipine, Cozaar    DVT prophylaxis: Lovenox Code Status: Full code Family Communication: At bedside Disposition Plan:  Status is: Inpatient  Remains inpatient appropriate because:Ongoing active pain requiring inpatient pain management, IV treatments appropriate due to intensity of illness or inability to take PO and Inpatient level of care appropriate due to severity of illness   Dispo: The patient is from: Home              Anticipated d/c is to: Home              Anticipated d/c date is: 2 days              Patient currently is not medically stable to d/c.  Remains on IV  antibiotics and IV pain medication.      Consultants:   General surgery   Procedures:   None   Antimicrobials:  Anti-infectives (From admission, onward)   Start     Dose/Rate Route Frequency Ordered Stop   11/17/20 1400  piperacillin-tazobactam (ZOSYN) IVPB 3.375 g        3.375 g 12.5 mL/hr over 4 Hours Intravenous Every 8 hours 11/17/20 0920     11/17/20 1000  cefTRIAXone (ROCEPHIN) 2 g in sodium chloride 0.9 % 100 mL IVPB  Status:  Discontinued        2 g 200 mL/hr over 30 Minutes Intravenous Every 24 hours 11/16/20 1345 11/17/20 0920   11/16/20 1400  metroNIDAZOLE (FLAGYL) tablet 500 mg  Status:  Discontinued       "And" Linked Group Details   500 mg Oral Every 8 hours 11/16/20 1127 11/17/20 0920   11/16/20 1130  cefTRIAXone (ROCEPHIN) 2 g in sodium chloride 0.9 % 100 mL IVPB       "And" Linked Group Details   2 g 200 mL/hr over 30 Minutes Intravenous  Once 11/16/20 1127 11/16/20 1345        Objective: Vitals:   11/17/20 0405 11/17/20 0450 11/17/20 0529 11/17/20 0915  BP: (!) 178/123 (!) 169/99 (!) 181/114 (!) 150/105  Pulse: (!) 110 98 (!) 108 (!) 107  Resp: 18 18 18 18   Temp: 98.1 F (36.7 C) 98.2 F (  36.8 C) 98.1 F (36.7 C) 97.7 F (36.5 C)  TempSrc: Oral Oral  Oral  SpO2: 97% 98% 98%   Weight:      Height:        Intake/Output Summary (Last 24 hours) at 11/17/2020 1002 Last data filed at 11/17/2020 0917 Gross per 24 hour  Intake 965 ml  Output 0 ml  Net 965 ml   Filed Weights   11/16/20 0743  Weight: 120.2 kg    Examination:  General exam: Appears calm but uncomfortable Respiratory system: Clear to auscultation. Respiratory effort normal. No respiratory distress. No conversational dyspnea.  Cardiovascular system: S1 & S2 heard, RRR. No murmurs. No pedal edema. Gastrointestinal system: Abdomen is nondistended, soft and nontender. Normal bowel sounds heard. Central nervous system: Alert and oriented. No focal neurological deficits. Speech  clear.  Extremities: Symmetric in appearance  Skin: No rashes, lesions or ulcers on exposed skin  Psychiatry: Judgement and insight appear normal. Mood & affect appropriate.   Data Reviewed: I have personally reviewed following labs and imaging studies  CBC: Recent Labs  Lab 11/16/20 0858 11/17/20 0524  WBC 15.3* 26.9*  HGB 17.8* 18.4*  HCT 51.0 54.0*  MCV 87.3 88.2  PLT 373 466*   Basic Metabolic Panel: Recent Labs  Lab 11/16/20 0858  NA 134*  K 4.5  CL 100  CO2 25  GLUCOSE 110*  BUN 15  CREATININE 0.93  CALCIUM 9.1   GFR: Estimated Creatinine Clearance: 132.4 mL/min (by C-G formula based on SCr of 0.93 mg/dL). Liver Function Tests: Recent Labs  Lab 11/16/20 0858  AST 16  ALT 15  ALKPHOS 91  BILITOT 1.3*  PROT 7.6  ALBUMIN 4.3   Recent Labs  Lab 11/16/20 0858  LIPASE 20   No results for input(s): AMMONIA in the last 168 hours. Coagulation Profile: No results for input(s): INR, PROTIME in the last 168 hours. Cardiac Enzymes: No results for input(s): CKTOTAL, CKMB, CKMBINDEX, TROPONINI in the last 168 hours. BNP (last 3 results) No results for input(s): PROBNP in the last 8760 hours. HbA1C: No results for input(s): HGBA1C in the last 72 hours. CBG: No results for input(s): GLUCAP in the last 168 hours. Lipid Profile: No results for input(s): CHOL, HDL, LDLCALC, TRIG, CHOLHDL, LDLDIRECT in the last 72 hours. Thyroid Function Tests: No results for input(s): TSH, T4TOTAL, FREET4, T3FREE, THYROIDAB in the last 72 hours. Anemia Panel: No results for input(s): VITAMINB12, FOLATE, FERRITIN, TIBC, IRON, RETICCTPCT in the last 72 hours. Sepsis Labs: No results for input(s): PROCALCITON, LATICACIDVEN in the last 168 hours.  Recent Results (from the past 240 hour(s))  Resp Panel by RT-PCR (Flu A&B, Covid) Nasopharyngeal Swab     Status: None   Collection Time: 11/16/20  2:50 PM   Specimen: Nasopharyngeal Swab; Nasopharyngeal(NP) swabs in vial transport  medium  Result Value Ref Range Status   SARS Coronavirus 2 by RT PCR NEGATIVE NEGATIVE Final    Comment: (NOTE) SARS-CoV-2 target nucleic acids are NOT DETECTED.  The SARS-CoV-2 RNA is generally detectable in upper respiratory specimens during the acute phase of infection. The lowest concentration of SARS-CoV-2 viral copies this assay can detect is 138 copies/mL. A negative result does not preclude SARS-Cov-2 infection and should not be used as the sole basis for treatment or other patient management decisions. A negative result may occur with  improper specimen collection/handling, submission of specimen other than nasopharyngeal swab, presence of viral mutation(s) within the areas targeted by this assay, and inadequate number  of viral copies(<138 copies/mL). A negative result must be combined with clinical observations, patient history, and epidemiological information. The expected result is Negative.  Fact Sheet for Patients:  BloggerCourse.com  Fact Sheet for Healthcare Providers:  SeriousBroker.it  This test is no t yet approved or cleared by the [place] FDA and  has been authorized for detection and/or diagnosis of SARS-CoV-2 by FDA under an Emergency Use Authorization (EUA). This EUA will remain  in effect (meaning this test can be used) for the duration of the COVID-19 declaration under Section 564(b)(1) of the Act, 21 U.S.C.section 360bbb-3(b)(1), unless the authorization is terminated  or revoked sooner.       Influenza A by PCR NEGATIVE NEGATIVE Final   Influenza B by PCR NEGATIVE NEGATIVE Final    Comment: (NOTE) The Xpert Xpress SARS-CoV-2/FLU/RSV plus assay is intended as an aid in the diagnosis of influenza from Nasopharyngeal swab specimens and should not be used as a sole basis for treatment. Nasal washings and aspirates are unacceptable for Xpert Xpress SARS-CoV-2/FLU/RSV testing.  Fact Sheet for  Patients: BloggerCourse.com  Fact Sheet for Healthcare Providers: SeriousBroker.it  This test is not yet approved or cleared by the [place] FDA and has been authorized for detection and/or diagnosis of SARS-CoV-2 by FDA under an Emergency Use Authorization (EUA). This EUA will remain in effect (meaning this test can be used) for the duration of the COVID-19 declaration under Section 564(b)(1) of the Act, 21 U.S.C. section 360bbb-3(b)(1), unless the authorization is terminated or revoked.  Performed at [facility], [address] [place], [place] [address]       Radiology Studies: CT ABDOMEN PELVIS W CONTRAST  Result Date: 11/16/2020 CLINICAL DATA:  Left lower abdominal pain. EXAM: CT ABDOMEN AND PELVIS WITH CONTRAST TECHNIQUE: Multidetector CT imaging of the abdomen and pelvis was performed using the standard protocol following bolus administration of intravenous contrast. CONTRAST:  OMNIPAQUE IOHEXOL 300 MG/ML  SOLN COMPARISON:  October 27, 2018 FINDINGS: Lower chest: Lung bases are clear. There are occasional foci of coronary artery calcification. Hepatobiliary: No focal liver lesions are appreciable. The gallbladder wall is not appreciably thickened. There is no biliary duct dilatation. Pancreas: There is no pancreatic mass or inflammatory focus. Spleen: No splenic lesions are evident. Adrenals/Urinary Tract: Adrenals bilaterally appear normal. There is no appreciable renal mass or hydronephrosis on either side. There is no appreciable renal or ureteral calculus on either side. Urinary bladder is midline with wall thickness within normal limits. Stomach/Bowel: There is wall thickening in the mid to distal sigmoid colon region with mild soft tissue stranding in this area. Proximal to this area of wall thickening, there is mesenteric thickening along the colon at the junction of the descending and sigmoid  regions. There are occasional diverticula in these areas. There is somewhat generalized colonic dilatation proximal to this area of more distal inflammation. There is no appreciable small bowel dilatation. Most loops of small bowel are fluid-filled. There is no well-defined bowel obstruction. The terminal ileum appears unremarkable. There is no appreciable free air or portal venous air evident. Vascular/Lymphatic: There is no abdominal aortic aneurysm. There is minimal aortic atherosclerosis. Major mesenteric vessels appear patent. Major venous structures appear patent. There is no evident adenopathy in the abdomen or pelvis. Reproductive: There are prostatic calculi. Prostate and seminal vesicles are normal in size and contour. Other: The appendix appears normal. There is no evident abscess or ascites in the abdomen or pelvis. There is slight fat in  the umbilicus. Musculoskeletal: There is stable anterior wedging of the T12 vertebral body. No blastic or lytic bone lesions are appreciable. No intramuscular lesions evident. IMPRESSION: 1. There is soft tissue/mesenteric stranding in the region of the junction of the descending colon and sigmoid colon with involvement of the proximal sigmoid colon. No well-defined fluid collection. There is wall thickening in the mid to distal sigmoid colon. No abscess or perforation evident. Appearance is consistent with a degree of colitis/diverticulitis involving much of the sigmoid colon and distal most aspect of descending colon. It should be noted that given the wall thickening in the mid to distal descending colon, an inflammatory neoplasm could be responsible for the other changes. It may be prudent to consider direct visualization of the sigmoid colon after treatment of acute inflammatory changes to assess for possible underlying neoplasm in this area. 2. Relative dilatation of the remainder of the colon. Colonic wall does not appear appreciably thickened more proximally.  Relatively mild stool volume present. 3. Most small bowel loops are fluid-filled which may indicate a degree of enteritis/ileus. No small bowel obstruction. Terminal ileum region appears normal. No small bowel or caval area 4. No abscess in the abdomen or pelvis. Appendix appears normal. No free air. 5.  Stable anterior wedging of the T12 vertebral body. Electronically Signed   By: Bretta Bang III M.D.   On: 11/16/2020 11:16      Scheduled Meds:  amLODipine  10 mg Oral Daily   docusate sodium  100 mg Oral BID   enoxaparin (LOVENOX) injection  60 mg Subcutaneous Q24H   ketorolac  15 mg Intravenous Q8H   losartan  50 mg Oral Daily   Continuous Infusions:  sodium chloride 1,000 mL (11/17/20 0653)   piperacillin-tazobactam       LOS: 1 day      Time spent: 35 minutes   Noralee Stain, DO [facility] 11/17/2020, 10:02 AM   Available via Epic secure chat 7am-7pm After these hours, please refer to coverage provider listed on [email or web address]

## 2020-11-17 NOTE — Consult Note (Signed)
Consult Note  Paul Gilmore [birth year]  [id number].    Requesting MD: Alvino Chapel Chief Complaint/Reason for Consult: Diverticulitis  HPI:  Patient is a 44 year old male who presented to [facility] with abdominal pain and nausea/vomiting. Pain started about 5 days ago and was similar in character to previous episodes of diverticulitis. Patient changed diet to liquids only at home but did not improve. Pain worsened on 12/22 and he decided to come in for evaluation. Patient reports pain was primary in LLQ and crampy in nature, now more generalized. Pain is relieved some with passing flatus and defecation. Patient reports large bout of diarrhea this AM, non-bloody. He reports associated nausea and vomiting most recently after taking oxycodone both times. Denies fever, chills, chest pain, SOB. He does report some darkened urine but no pneumaturia or pain with urination. He has had what seems like 2 prior episodes of diverticulitis back in 2019 with 4 total hospital admissions for this. He had a colonoscopy in 2019 that showed diverticulosis but no polyps or concerns for neoplasm at that time. He has since been avoiding things like nuts and seeds and red meat and has been trying to manage diverticulitis with diet. He was seen in [facility] office by Dr. Sheliah Hatch and was trying to lose weight prior to being considered for elective partial colectomy.   PMH otherwise significant for HTN, GERD, obesity. NKDA. No past abdominal surgery. No blood thinning medications. He reports occasional alcohol use and denies tobacco or illicit drug use. He is married and his wife was at the bedside this AM. He works for a [employer], [employer] (consulting) but mostly with computers.   ROS: Review of Systems  Constitutional: Negative for chills and fever.  Respiratory: Negative for shortness of breath and wheezing.   Cardiovascular: Negative for chest pain and palpitations.  Gastrointestinal: Positive for abdominal pain, diarrhea, nausea and vomiting.  Negative for blood in stool, constipation and melena.  Genitourinary: Negative for dysuria, frequency and urgency.       Dark urine  All other systems reviewed and are negative.   Family History  Problem Relation Age of Onset  . COPD Mother   . Diverticulitis Mother   . Alcohol abuse Father   . Hypertension Father   . Throat cancer Father   . Esophageal cancer Father   . Colon cancer Neg Hx   . Rectal cancer Neg Hx   . Stomach cancer Neg Hx     Past Medical History:  Diagnosis Date  . Allergy   . Anxiety   . Bowel obstruction (HCC)   . Diverticulitis of large intestine with perforation and abscess 02/2018  . GERD (gastroesophageal reflux disease)   . HTN (hypertension)     Past Surgical History:  Procedure Laterality Date  . NO PAST SURGERIES      Social History:  reports that he has never smoked. He quit smokeless tobacco use about 6 years ago.  His smokeless tobacco use included chew. He reports current alcohol use. He reports current drug use. Drug: Marijuana.  Allergies: No Known Allergies  Medications Prior to Admission  Medication Sig Dispense Refill  . acetaminophen (TYLENOL) 500 MG tablet Take 1,000 mg by mouth every 8 (eight) hours as needed for moderate pain.    Marland Kitchen albuterol (PROVENTIL HFA;VENTOLIN HFA) 108 (90 Base) MCG/ACT inhaler Inhale 1 puff into the lungs every 6 (six) hours as needed for wheezing or shortness of breath.    Marland Kitchen acetaminophen (TYLENOL) 325 MG  tablet Take 3 tablets (975 mg total) by mouth every 6 (six) hours as needed for mild pain (or Fever >/= 101). (Patient not taking: Reported on 11/16/2020)    . amLODipine (NORVASC) 5 MG tablet Take 1 tablet (5 mg total) by mouth daily. (Patient not taking: No sig reported) 90 tablet 1  . cyclobenzaprine (FLEXERIL) 10 MG tablet Take 1 tablet (10 mg total) by mouth at bedtime as needed for muscle spasms. (Patient not taking: No sig reported) 60 tablet 1  . diclofenac (VOLTAREN) 75 MG EC tablet Take 1 tablet  (75 mg total) by mouth 2 (two) times daily. (Patient not taking: No sig reported) 60 tablet 1  . HYDROcodone-acetaminophen (NORCO) 7.5-325 MG tablet Take 1 tablet by mouth every 6 (six) hours as needed for moderate pain. (Patient not taking: No sig reported) 20 tablet 0    Blood pressure (!) 150/105, pulse (!) 107, temperature 97.7 F (36.5 C), temperature source Oral, resp. rate 18, height  (1.803 m), weight 120.2 kg, SpO2 98 %. Physical Exam:  General: pleasant, WD, obese male who is laying in bed and appears uncomfortable  HEENT: head is normocephalic, atraumatic.  Sclera are noninjected.  PERRL.  Ears and nose without any masses or lesions.  Mouth is pink and moist Heart: regular, rate, and rhythm.  Normal s1,s2. No obvious murmurs, gallops, or rubs noted.  Palpable radial and pedal pulses bilaterally Lungs: CTAB, no wheezes, rhonchi, or rales noted.  Respiratory effort nonlabored Abd: soft, moderately ttp in LLQ and suprapubic abdomen, mild diffuse generalized ttp but no peritonitis, ND, +BS, no masses, hernias, or organomegaly MS: all 4 extremities are symmetrical with no cyanosis, clubbing, or edema. Skin: warm and dry with no masses, lesions, or rashes Neuro: Cranial nerves 2-12 grossly intact, sensation is normal throughout Psych: A&Ox3 with an appropriate affect.   Results for orders placed or performed during the hospital encounter of 11/16/20 (from the past 48 hour(s))  Lipase, blood     Status: None   Collection Time: 11/16/20  8:58 AM  Result Value Ref Range   Lipase 20 11 - 51 U/L    Comment: Performed at [facility], [address] [place], [place] [address]  Comprehensive metabolic panel     Status: Abnormal   Collection Time: 11/16/20  8:58 AM  Result Value Ref Range   Sodium 134 (L) 135 - 145 mmol/L   Potassium 4.5 3.5 - 5.1 mmol/L   Chloride 100 98 - 111 mmol/L   CO2 25 22 - 32 mmol/L   Glucose, Bld 110 (H) 70 - 99 mg/dL    Comment:  Glucose reference range applies only to samples taken after fasting for at least 8 hours.   BUN 15 6 - 20 mg/dL   Creatinine, Ser 6.04 0.61 - 1.24 mg/dL   Calcium 9.1 8.9 - 54.0 mg/dL   Total Protein 7.6 6.5 - 8.1 g/dL   Albumin 4.3 3.5 - 5.0 g/dL   AST 16 15 - 41 U/L   ALT 15 0 - 44 U/L   Alkaline Phosphatase 91 38 - 126 U/L   Total Bilirubin 1.3 (H) 0.3 - 1.2 mg/dL   GFR, Estimated >98 >11 mL/min    Comment: (NOTE) Calculated using the CKD-EPI Creatinine Equation (2021)    Anion gap 9 5 - 15    Comment: Performed at [facility], [address] [place], [place] [address]  CBC     Status: Abnormal  Collection Time: 11/16/20  8:58 AM  Result Value Ref Range   WBC 15.3 (H) 4.0 - 10.5 K/uL   RBC 5.84 (H) 4.22 - 5.81 MIL/uL   Hemoglobin 17.8 (H) 13.0 - 17.0 g/dL   HCT 53.6 64.4 - 03.4 %   MCV 87.3 80.0 - 100.0 fL   MCH 30.5 26.0 - 34.0 pg   MCHC 34.9 30.0 - 36.0 g/dL   RDW 74.2 59.5 - 63.8 %   Platelets 373 150 - 400 K/uL   nRBC 0.0 0.0 - 0.2 %    Comment: Performed at [facility], [address] [place], [place] [address]  Urinalysis, Routine w reflex microscopic Urine, Clean Catch     Status: Abnormal   Collection Time: 11/16/20  9:54 AM  Result Value Ref Range   Color, Urine BROWN (A) YELLOW    Comment: BIOCHEMICALS MAY BE AFFECTED BY COLOR   APPearance CLEAR CLEAR   Specific Gravity, Urine 1.030 1.005 - 1.030   pH 5.0 5.0 - 8.0   Glucose, UA NEGATIVE NEGATIVE mg/dL   Hgb urine dipstick NEGATIVE NEGATIVE   Bilirubin Urine SMALL (A) NEGATIVE   Ketones, ur 20 (A) NEGATIVE mg/dL   Protein, ur 30 (A) NEGATIVE mg/dL   Nitrite NEGATIVE NEGATIVE   Leukocytes,Ua NEGATIVE NEGATIVE   RBC / HPF 0-5 0 - 5 RBC/hpf   WBC, UA 0-5 0 - 5 WBC/hpf   Bacteria, UA NONE SEEN NONE SEEN   Squamous Epithelial / LPF 0-5 0 - 5   Mucus PRESENT     Comment: Performed at [facility], [address] [place], [place] [address]  Resp  Panel by RT-PCR (Flu A&B, Covid) Nasopharyngeal Swab     Status: None   Collection Time: 11/16/20  2:50 PM   Specimen: Nasopharyngeal Swab; Nasopharyngeal(NP) swabs in vial transport medium  Result Value Ref Range   SARS Coronavirus 2 by RT PCR NEGATIVE NEGATIVE    Comment: (NOTE) SARS-CoV-2 target nucleic acids are NOT DETECTED.  The SARS-CoV-2 RNA is generally detectable in upper respiratory specimens during the acute phase of infection. The lowest concentration of SARS-CoV-2 viral copies this assay can detect is 138 copies/mL. A negative result does not preclude SARS-Cov-2 infection and should not be used as the sole basis for treatment or other patient management decisions. A negative result may occur with  improper specimen collection/handling, submission of specimen other than nasopharyngeal swab, presence of viral mutation(s) within the areas targeted by this assay, and inadequate number of viral copies(<138 copies/mL). A negative result must be combined with clinical observations, patient history, and epidemiological information. The expected result is Negative.  Fact Sheet for Patients:  BloggerCourse.com  Fact Sheet for Healthcare Providers:  SeriousBroker.it  This test is no t yet approved or cleared by the [place] FDA and  has been authorized for detection and/or diagnosis of SARS-CoV-2 by FDA under an Emergency Use Authorization (EUA). This EUA will remain  in effect (meaning this test can be used) for the duration of the COVID-19 declaration under Section 564(b)(1) of the Act, 21 U.S.C.section 360bbb-3(b)(1), unless the authorization is terminated  or revoked sooner.       Influenza A by PCR NEGATIVE NEGATIVE   Influenza B by PCR NEGATIVE NEGATIVE    Comment: (NOTE) The Xpert Xpress SARS-CoV-2/FLU/RSV plus assay is intended as an aid in the diagnosis of influenza from Nasopharyngeal swab specimens  and should not be used as a sole basis for treatment. Nasal  washings and aspirates are unacceptable for Xpert Xpress SARS-CoV-2/FLU/RSV testing.  Fact Sheet for Patients: BloggerCourse.com  Fact Sheet for Healthcare Providers: SeriousBroker.it  This test is not yet approved or cleared by the [place] FDA and has been authorized for detection and/or diagnosis of SARS-CoV-2 by FDA under an Emergency Use Authorization (EUA). This EUA will remain in effect (meaning this test can be used) for the duration of the COVID-19 declaration under Section 564(b)(1) of the Act, 21 U.S.C. section 360bbb-3(b)(1), unless the authorization is terminated or revoked.  Performed at [facility], [address], [place], [place] [address]   CBC     Status: Abnormal   Collection Time: 11/17/20  5:24 AM  Result Value Ref Range   WBC 26.9 (H) 4.0 - 10.5 K/uL   RBC 6.12 (H) 4.22 - 5.81 MIL/uL   Hemoglobin 18.4 (H) 13.0 - 17.0 g/dL   HCT 85.9 (H) 29.2 - 44.6 %   MCV 88.2 80.0 - 100.0 fL   MCH 30.1 26.0 - 34.0 pg   MCHC 34.1 30.0 - 36.0 g/dL   RDW 28.6 38.1 - 77.1 %   Platelets 466 (H) 150 - 400 K/uL   nRBC 0.0 0.0 - 0.2 %    Comment: Performed at [facility], [address], [place], [place] [address]   CT ABDOMEN PELVIS W CONTRAST  Result Date: 11/16/2020 CLINICAL DATA:  Left lower abdominal pain. EXAM: CT ABDOMEN AND PELVIS WITH CONTRAST TECHNIQUE: Multidetector CT imaging of the abdomen and pelvis was performed using the standard protocol following bolus administration of intravenous contrast. CONTRAST:  OMNIPAQUE IOHEXOL 300 MG/ML  SOLN COMPARISON:  October 27, 2018 FINDINGS: Lower chest: Lung bases are clear. There are occasional foci of coronary artery calcification. Hepatobiliary: No focal liver lesions are appreciable. The gallbladder wall is not appreciably thickened. There is no biliary duct  dilatation. Pancreas: There is no pancreatic mass or inflammatory focus. Spleen: No splenic lesions are evident. Adrenals/Urinary Tract: Adrenals bilaterally appear normal. There is no appreciable renal mass or hydronephrosis on either side. There is no appreciable renal or ureteral calculus on either side. Urinary bladder is midline with wall thickness within normal limits. Stomach/Bowel: There is wall thickening in the mid to distal sigmoid colon region with mild soft tissue stranding in this area. Proximal to this area of wall thickening, there is mesenteric thickening along the colon at the junction of the descending and sigmoid regions. There are occasional diverticula in these areas. There is somewhat generalized colonic dilatation proximal to this area of more distal inflammation. There is no appreciable small bowel dilatation. Most loops of small bowel are fluid-filled. There is no well-defined bowel obstruction. The terminal ileum appears unremarkable. There is no appreciable free air or portal venous air evident. Vascular/Lymphatic: There is no abdominal aortic aneurysm. There is minimal aortic atherosclerosis. Major mesenteric vessels appear patent. Major venous structures appear patent. There is no evident adenopathy in the abdomen or pelvis. Reproductive: There are prostatic calculi. Prostate and seminal vesicles are normal in size and contour. Other: The appendix appears normal. There is no evident abscess or ascites in the abdomen or pelvis. There is slight fat in the umbilicus. Musculoskeletal: There is stable anterior wedging of the T12 vertebral body. No blastic or lytic bone lesions are appreciable. No intramuscular lesions evident. IMPRESSION: 1. There is soft tissue/mesenteric stranding in the region of the junction of the descending colon and sigmoid colon with involvement of the proximal sigmoid colon.  No well-defined fluid collection. There is wall thickening in the mid to distal sigmoid  colon. No abscess or perforation evident. Appearance is consistent with a degree of colitis/diverticulitis involving much of the sigmoid colon and distal most aspect of descending colon. It should be noted that given the wall thickening in the mid to distal descending colon, an inflammatory neoplasm could be responsible for the other changes. It may be prudent to consider direct visualization of the sigmoid colon after treatment of acute inflammatory changes to assess for possible underlying neoplasm in this area. 2. Relative dilatation of the remainder of the colon. Colonic wall does not appear appreciably thickened more proximally. Relatively mild stool volume present. 3. Most small bowel loops are fluid-filled which may indicate a degree of enteritis/ileus. No small bowel obstruction. Terminal ileum region appears normal. No small bowel or caval area 4. No abscess in the abdomen or pelvis. Appendix appears normal. No free air. 5.  Stable anterior wedging of the T12 vertebral body. Electronically Signed   By: Bretta BangWilliam  Woodruff III M.D.   On: 11/16/2020 11:16      Assessment/Plan HTN GERD Obesity - BMI 36.96  Sigmoid diverticulitis without perforation or abscess - CT 12/22 shows colonic wall thickening in sigmoid and distal descending colon - WBC was 15.3 on admit and is up to 26.9 this AM, afebrile - recommend switching to IV Zosyn - ttp but no significant peritonitis  - I would recommend IV abx and bowel rest and trial of conservative management at this time. If patient does not improve or worsens, he may warrant surgical intervention which would most likely mean open colectomy/colostomy - we will follow along with you  FEN: NPO, IVF VTE: SCDs, lovenox ID: IV rocephin/PO flagyl 12/22>12/23; IV Zosyn 12/23>>  Juliet RudeKelly R Tiandra Swoveland, [facility] 11/17/2020, 9:20 AM Please see Amion for pager number during day hours 7:00am-4:30pm

## 2020-11-18 DIAGNOSIS — K5792 Diverticulitis of intestine, part unspecified, without perforation or abscess without bleeding: Secondary | ICD-10-CM | POA: Diagnosis not present

## 2020-11-18 LAB — BASIC METABOLIC PANEL
Anion gap: 13 (ref 5–15)
BUN: 13 mg/dL (ref 6–20)
CO2: 23 mmol/L (ref 22–32)
Calcium: 8.6 mg/dL — ABNORMAL LOW (ref 8.9–10.3)
Chloride: 99 mmol/L (ref 98–111)
Creatinine, Ser: 0.79 mg/dL (ref 0.61–1.24)
GFR, Estimated: >60 mL/min (ref >60)
Glucose, Bld: 106 mg/dL — ABNORMAL HIGH (ref 70–99)
Potassium: 3.8 mmol/L (ref 3.5–5.1)
Sodium: 135 mmol/L (ref 135–145)

## 2020-11-18 LAB — CBC
HCT: 49.1 % (ref 39.0–52.0)
Hemoglobin: 16.9 g/dL (ref 13.0–17.0)
MCH: 30.4 pg (ref 26.0–34.0)
MCHC: 34.4 g/dL (ref 30.0–36.0)
MCV: 88.3 fL (ref 80.0–100.0)
Platelets: 337 10*3/uL (ref 150–400)
RBC: 5.56 MIL/uL (ref 4.22–5.81)
RDW: 12.6 % (ref 11.5–15.5)
WBC: 18.6 10*3/uL — ABNORMAL HIGH (ref 4.0–10.5)
nRBC: 0 % (ref 0.0–0.2)

## 2020-11-18 MED ORDER — HYDROCHLOROTHIAZIDE 12.5 MG PO CAPS
12.5000 mg | ORAL_CAPSULE | Freq: Every day | ORAL | Status: DC
  Administered 2020-11-18 – 2020-11-21 (×4): 12.5 mg via ORAL
  Filled 2020-11-18 (×3): qty 1

## 2020-11-18 NOTE — Progress Notes (Signed)
PROGRESS NOTE    ATHARV BARRIERE  PNT:[id number] DOB: 1976-08-18 DOA: 11/16/2020 PCP: Patient, No Pcp Per     Brief Narrative:  STOY FENN is a 44 y.o. male with medical history significant of HTN and history of diverticulitis. He presented with LLQ abdominal pain. Reports that it started about 4 days ago. He immediately recognized it as being similar to his previous bout with diverticulitis. So he changed his diet to liquid diet and took APAP. This did not help. His symptoms worsened through this morning, so he decided to come to the ED.  CT abdomen pelvis revealed acute diverticulitis.  He was started on empiric Rocephin, Flagyl and admitted to the hospital.  New events last 24 hours / Subjective: Pain much better controlled, passing gas  Assessment & Plan:   Active Problems:   Diverticulitis   Recurrent diverticulitis -CT abdomen pelvis: soft tissue/mesenteric stranding in the region of the junction of the descending colon and sigmoid colon with involvement of the proximal sigmoid colon. No well-defined fluid collection. There is wall thickening in the mid to distal sigmoid colon. No abscess or perforation evident.  -Flagyl and Rocephin --> Zosyn  -WBC trending downward -General surgery following  Essential hypertension -Continue amlodipine, Cozaar. Add HCTZ     DVT prophylaxis: Lovenox Code Status: Full code Family Communication: No family at bedside Disposition Plan:  Status is: Inpatient  Remains inpatient appropriate because:Ongoing active pain requiring inpatient pain management, IV treatments appropriate due to intensity of illness or inability to take PO and Inpatient level of care appropriate due to severity of illness   Dispo: The patient is from: Home              Anticipated d/c is to: Home              Anticipated d/c date is: 2 days              Patient currently is not medically stable to d/c.  Remains on IV antibiotics and IV pain medication. Advance diet  to clear liquid today       Consultants:   General surgery   Procedures:   None   Antimicrobials:  Anti-infectives (From admission, onward)   Start     Dose/Rate Route Frequency Ordered Stop   11/17/20 1400  piperacillin-tazobactam (ZOSYN) IVPB 3.375 g        3.375 g 12.5 mL/hr over 4 Hours Intravenous Every 8 hours 11/17/20 0920     11/17/20 1000  cefTRIAXone (ROCEPHIN) 2 g in sodium chloride 0.9 % 100 mL IVPB  Status:  Discontinued        2 g 200 mL/hr over 30 Minutes Intravenous Every 24 hours 11/16/20 1345 11/17/20 0920   11/16/20 1400  metroNIDAZOLE (FLAGYL) tablet 500 mg  Status:  Discontinued       "And" Linked Group Details   500 mg Oral Every 8 hours 11/16/20 1127 11/17/20 0920   11/16/20 1130  cefTRIAXone (ROCEPHIN) 2 g in sodium chloride 0.9 % 100 mL IVPB       "And" Linked Group Details   2 g 200 mL/hr over 30 Minutes Intravenous  Once 11/16/20 1127 11/16/20 1345       Objective: Vitals:   11/17/20 0915 11/17/20 1331 11/17/20 2103 11/18/20 0305  BP: (!) 150/105 (!) 152/103 (!) 165/96 (!) 172/112  Pulse: (!) 107 (!) 105 98 99  Resp: 18  18 20   Temp: 97.7 F (36.5 C) 97.8 F (36.6 C) 98.9  F (37.2 C) 97.8 F (36.6 C)  TempSrc: Oral Oral  Oral  SpO2:  99% 98% 98%  Weight:      Height:        Intake/Output Summary (Last 24 hours) at 11/18/2020 1032 Last data filed at 11/18/2020 1000 Gross per 24 hour  Intake 503.55 ml  Output 0 ml  Net 503.55 ml   Filed Weights   11/16/20 0743  Weight: 120.2 kg    Examination: General exam: Appears calm and comfortable  Respiratory system: Clear to auscultation. Respiratory effort normal. Cardiovascular system: S1 & S2 heard, RRR. No pedal edema. Gastrointestinal system: Abdomen is nondistended, soft and mildly tender to palpation left lower quadrant. Normal bowel sounds heard. Central nervous system: Alert and oriented. Non focal exam. Speech clear  Extremities: Symmetric in appearance bilaterally  Skin:  No rashes, lesions or ulcers on exposed skin  Psychiatry: Judgement and insight appear stable. Mood & affect appropriate.    Data Reviewed: I have personally reviewed following labs and imaging studies  CBC: Recent Labs  Lab 11/16/20 0858 11/17/20 0524 11/18/20 0533  WBC 15.3* 26.9* 18.6*  HGB 17.8* 18.4* 16.9  HCT 51.0 54.0* 49.1  MCV 87.3 88.2 88.3  PLT 373 466* 337   Basic Metabolic Panel: Recent Labs  Lab 11/16/20 0858 11/17/20 2214 11/18/20 0533  NA 134* 132* 135  K 4.5 4.0 3.8  CL 100 99 99  CO2 25 22 23   GLUCOSE 110* 117* 106*  BUN 15 14 13   CREATININE 0.93 0.76 0.79  CALCIUM 9.1 8.6* 8.6*   GFR: Estimated Creatinine Clearance: 153.9 mL/min (by C-G formula based on SCr of 0.79 mg/dL). Liver Function Tests: Recent Labs  Lab 11/16/20 0858 11/17/20 2214  AST 16 16  ALT 15 14  ALKPHOS 91 77  BILITOT 1.3* 0.8  PROT 7.6 6.8  ALBUMIN 4.3 3.6   Recent Labs  Lab 11/16/20 0858  LIPASE 20   No results for input(s): AMMONIA in the last 168 hours. Coagulation Profile: No results for input(s): INR, PROTIME in the last 168 hours. Cardiac Enzymes: No results for input(s): CKTOTAL, CKMB, CKMBINDEX, TROPONINI in the last 168 hours. BNP (last 3 results) No results for input(s): PROBNP in the last 8760 hours. HbA1C: No results for input(s): HGBA1C in the last 72 hours. CBG: No results for input(s): GLUCAP in the last 168 hours. Lipid Profile: No results for input(s): CHOL, HDL, LDLCALC, TRIG, CHOLHDL, LDLDIRECT in the last 72 hours. Thyroid Function Tests: No results for input(s): TSH, T4TOTAL, FREET4, T3FREE, THYROIDAB in the last 72 hours. Anemia Panel: No results for input(s): VITAMINB12, FOLATE, FERRITIN, TIBC, IRON, RETICCTPCT in the last 72 hours. Sepsis Labs: No results for input(s): PROCALCITON, LATICACIDVEN in the last 168 hours.  Recent Results (from the past 240 hour(s))  Resp Panel by RT-PCR (Flu A&B, Covid) Nasopharyngeal Swab     Status: None    Collection Time: 11/16/20  2:50 PM   Specimen: Nasopharyngeal Swab; Nasopharyngeal(NP) swabs in vial transport medium  Result Value Ref Range Status   SARS Coronavirus 2 by RT PCR NEGATIVE NEGATIVE Final    Comment: (NOTE) SARS-CoV-2 target nucleic acids are NOT DETECTED.  The SARS-CoV-2 RNA is generally detectable in upper respiratory specimens during the acute phase of infection. The lowest concentration of SARS-CoV-2 viral copies this assay can detect is 138 copies/mL. A negative result does not preclude SARS-Cov-2 infection and should not be used as the sole basis for treatment or other patient management decisions. A  negative result may occur with  improper specimen collection/handling, submission of specimen other than nasopharyngeal swab, presence of viral mutation(s) within the areas targeted by this assay, and inadequate number of viral copies(<138 copies/mL). A negative result must be combined with clinical observations, patient history, and epidemiological information. The expected result is Negative.  Fact Sheet for Patients:  BloggerCourse.comhttps://www.fda.gov/media/152166/download  Fact Sheet for Healthcare Providers:  SeriousBroker.ithttps://www.fda.gov/media/152162/download  This test is no t yet approved or cleared by the [place] FDA and  has been authorized for detection and/or diagnosis of SARS-CoV-2 by FDA under an Emergency Use Authorization (EUA). This EUA will remain  in effect (meaning this test can be used) for the duration of the COVID-19 declaration under Section 564(b)(1) of the Act, 21 U.S.C.section 360bbb-3(b)(1), unless the authorization is terminated  or revoked sooner.       Influenza A by PCR NEGATIVE NEGATIVE Final   Influenza B by PCR NEGATIVE NEGATIVE Final    Comment: (NOTE) The Xpert Xpress SARS-CoV-2/FLU/RSV plus assay is intended as an aid in the diagnosis of influenza from Nasopharyngeal swab specimens and should not be used as a sole basis for treatment.  Nasal washings and aspirates are unacceptable for Xpert Xpress SARS-CoV-2/FLU/RSV testing.  Fact Sheet for Patients: BloggerCourse.comhttps://www.fda.gov/media/152166/download  Fact Sheet for Healthcare Providers: SeriousBroker.ithttps://www.fda.gov/media/152162/download  This test is not yet approved or cleared by the [place] FDA and has been authorized for detection and/or diagnosis of SARS-CoV-2 by FDA under an Emergency Use Authorization (EUA). This EUA will remain in effect (meaning this test can be used) for the duration of the COVID-19 declaration under Section 564(b)(1) of the Act, 21 U.S.C. section 360bbb-3(b)(1), unless the authorization is terminated or revoked.  Performed at [facility], [address] [place], [place] [id number]       Radiology Studies: CT ABDOMEN PELVIS W CONTRAST  Result Date: 11/16/2020 CLINICAL DATA:  Left lower abdominal pain. EXAM: CT ABDOMEN AND PELVIS WITH CONTRAST TECHNIQUE: Multidetector CT imaging of the abdomen and pelvis was performed using the standard protocol following bolus administration of intravenous contrast. CONTRAST:  100mL OMNIPAQUE IOHEXOL 300 MG/ML  SOLN COMPARISON:  October 27, 2018 FINDINGS: Lower chest: Lung bases are clear. There are occasional foci of coronary artery calcification. Hepatobiliary: No focal liver lesions are appreciable. The gallbladder wall is not appreciably thickened. There is no biliary duct dilatation. Pancreas: There is no pancreatic mass or inflammatory focus. Spleen: No splenic lesions are evident. Adrenals/Urinary Tract: Adrenals bilaterally appear normal. There is no appreciable renal mass or hydronephrosis on either side. There is no appreciable renal or ureteral calculus on either side. Urinary bladder is midline with wall thickness within normal limits. Stomach/Bowel: There is wall thickening in the mid to distal sigmoid colon region with mild soft tissue stranding in this area. Proximal to this area of wall  thickening, there is mesenteric thickening along the colon at the junction of the descending and sigmoid regions. There are occasional diverticula in these areas. There is somewhat generalized colonic dilatation proximal to this area of more distal inflammation. There is no appreciable small bowel dilatation. Most loops of small bowel are fluid-filled. There is no well-defined bowel obstruction. The terminal ileum appears unremarkable. There is no appreciable free air or portal venous air evident. Vascular/Lymphatic: There is no abdominal aortic aneurysm. There is minimal aortic atherosclerosis. Major mesenteric vessels appear patent. Major venous structures appear patent. There is no evident adenopathy in the abdomen or pelvis. Reproductive: There are prostatic calculi. Prostate and  seminal vesicles are normal in size and contour. Other: The appendix appears normal. There is no evident abscess or ascites in the abdomen or pelvis. There is slight fat in the umbilicus. Musculoskeletal: There is stable anterior wedging of the T12 vertebral body. No blastic or lytic bone lesions are appreciable. No intramuscular lesions evident. IMPRESSION: 1. There is soft tissue/mesenteric stranding in the region of the junction of the descending colon and sigmoid colon with involvement of the proximal sigmoid colon. No well-defined fluid collection. There is wall thickening in the mid to distal sigmoid colon. No abscess or perforation evident. Appearance is consistent with a degree of colitis/diverticulitis involving much of the sigmoid colon and distal most aspect of descending colon. It should be noted that given the wall thickening in the mid to distal descending colon, an inflammatory neoplasm could be responsible for the other changes. It may be prudent to consider direct visualization of the sigmoid colon after treatment of acute inflammatory changes to assess for possible underlying neoplasm in this area. 2. Relative  dilatation of the remainder of the colon. Colonic wall does not appear appreciably thickened more proximally. Relatively mild stool volume present. 3. Most small bowel loops are fluid-filled which may indicate a degree of enteritis/ileus. No small bowel obstruction. Terminal ileum region appears normal. No small bowel or caval area 4. No abscess in the abdomen or pelvis. Appendix appears normal. No free air. 5.  Stable anterior wedging of the T12 vertebral body. Electronically Signed   By: Bretta Bang III M.D.   On: 11/16/2020 11:16      Scheduled Meds: . amLODipine  10 mg Oral Daily  . docusate sodium  100 mg Oral BID  . enoxaparin (LOVENOX) injection  60 mg Subcutaneous Q24H  . losartan  50 mg Oral Daily   Continuous Infusions: . sodium chloride 1,000 mL (11/17/20 0653)  . piperacillin-tazobactam 3.375 g (11/18/20 0500)     LOS: 2 days      Time spent: 20 minutes   Noralee Stain, DO [facility] 11/18/2020, 10:32 AM   Available via Epic secure chat 7am-7pm After these hours, please refer to coverage provider listed on [email or web address]

## 2020-11-18 NOTE — Progress Notes (Signed)
Patient ID: Paul Gilmore, male   DOB: [birth year], 44 y.o.   MRN: [id number]   Acute Care Surgery Service Progress Note:    Chief Complaint/Subjective: Reports much less abd pain No n/v Reports lots of flatus and had BM Feels a lot less bloated Still HTN at times  Objective: Vital signs in last 24 hours: Temp:  [97.8 F (36.6 C)-98.9 F (37.2 C)] 97.8 F (36.6 C) (12/24 0305) Pulse Rate:  [98-105] 99 (12/24 0305) Resp:  [18-20] 20 (12/24 0305) BP: (152-172)/(96-112) 172/112 (12/24 0305) SpO2:  [98 %-99 %] 98 % (12/24 0305) Last BM Date: 11/17/20  Intake/Output from previous day: 12/23 0701 - 12/24 0700 In: 503.6 [I.V.:483.3; IV Piggyback:20.3] Out: -  Intake/Output this shift: No intake/output data recorded.  Lungs:  nonlabored  Cardiovascular: reg  Abd: soft, obese, min TTP, much less tender  Extremities: no edema, +SCDs  Neuro: alert, nonfocal  Lab Results: CBC  Recent Labs    11/17/20 0524 11/18/20 0533  WBC 26.9* 18.6*  HGB 18.4* 16.9  HCT 54.0* 49.1  PLT 466* 337   BMET Recent Labs    11/17/20 2214 11/18/20 0533  NA 132* 135  K 4.0 3.8  CL 99 99  CO2 22 23  GLUCOSE 117* 106*  BUN 14 13  CREATININE 0.76 0.79  CALCIUM 8.6* 8.6*   LFT Hepatic Function Latest Ref Rng & Units 11/17/2020 11/16/2020 10/30/2018  Total Protein 6.5 - 8.1 g/dL 6.8 7.6 7.5  Albumin 3.5 - 5.0 g/dL 3.6 4.3 3.9  AST 15 - 41 U/L $Remo'16 16 20  'kRMPp$ ALT 0 - 44 U/L $Remo'14 15 25  'VipYw$ Alk Phosphatase 38 - 126 U/L 77 91 78  Total Bilirubin 0.3 - 1.2 mg/dL 0.8 1.3(H) 0.5   PT/INR No results for input(s): LABPROT, INR in the last 72 hours. ABG No results for input(s): PHART, HCO3 in the last 72 hours.  Invalid input(s): PCO2, PO2  Studies/Results:  Anti-infectives: Anti-infectives (From admission, onward)   Start     Dose/Rate Route Frequency Ordered Stop   11/17/20 1400  piperacillin-tazobactam (ZOSYN) IVPB 3.375 g        3.375 g 12.5 mL/hr over 4 Hours Intravenous Every 8 hours  11/17/20 0920     11/17/20 1000  cefTRIAXone (ROCEPHIN) 2 g in sodium chloride 0.9 % 100 mL IVPB  Status:  Discontinued        2 g 200 mL/hr over 30 Minutes Intravenous Every 24 hours 11/16/20 1345 11/17/20 0920   11/16/20 1400  metroNIDAZOLE (FLAGYL) tablet 500 mg  Status:  Discontinued       "And" Linked Group Details   500 mg Oral Every 8 hours 11/16/20 1127 11/17/20 0920   11/16/20 1130  cefTRIAXone (ROCEPHIN) 2 g in sodium chloride 0.9 % 100 mL IVPB       "And" Linked Group Details   2 g 200 mL/hr over 30 Minutes Intravenous  Once 11/16/20 1127 11/16/20 1345      Medications: Scheduled Meds:  amLODipine  10 mg Oral Daily   docusate sodium  100 mg Oral BID   enoxaparin (LOVENOX) injection  60 mg Subcutaneous Q24H   losartan  50 mg Oral Daily   Continuous Infusions:  sodium chloride 1,000 mL (11/17/20 0653)   piperacillin-tazobactam 3.375 g (11/18/20 0500)   PRN Meds:.acetaminophen **OR** acetaminophen, albuterol, labetalol, morphine injection, ondansetron **OR** ondansetron (ZOFRAN) IV, simethicone, traMADol  Assessment/Plan: Patient Active Problem List   Diagnosis Date Noted   Chronic idiopathic gout involving  toe of left foot without tophus 01/26/2020   Essential hypertension 10/27/2018   Diverticulitis 10/09/2018   Thoracic back pain 04/01/2018   Diverticulitis of intestine with abscess 03/22/2018   Obesity, Class III, BMI 40-49.9 (morbid obesity) ([place]) 03/08/2018   Perforation of sigmoid colon due to diverticulitis 03/03/2018   Hyperglycemia 03/03/2018   Right foot pain 06/14/2017   Great toe pain, left 03/04/2017   Lower back injury 09/24/2016   Hand laceration 09/24/2016   Neck pain 02/08/2014   HTN GERD Obesity - BMI 36.96  Sigmoid diverticulitis without perforation or abscess - CT 12/22 shows colonic wall thickening in sigmoid and distal descending colon - WBC was 15.3 --> 26.9 -->18.6, afebrile -  - FEN: start clears, IVF VTE:  SCDs, lovenox ID: IV rocephin/PO flagyl 12/22>12/23; IV Zosyn 12/23>> Disposition: improving. Starts CLD, ambulate, cont IV abx    LOS: 2 days    Leighton Ruff. Redmond Pulling, MD, FACS General, Bariatric, & Minimally Invasive Surgery [phone] [facility]

## 2020-11-19 DIAGNOSIS — K5792 Diverticulitis of intestine, part unspecified, without perforation or abscess without bleeding: Secondary | ICD-10-CM | POA: Diagnosis not present

## 2020-11-19 LAB — CBC
HCT: 48.2 % (ref 39.0–52.0)
Hemoglobin: 16.4 g/dL (ref 13.0–17.0)
MCH: 30.4 pg (ref 26.0–34.0)
MCHC: 34 g/dL (ref 30.0–36.0)
MCV: 89.3 fL (ref 80.0–100.0)
Platelets: 338 10*3/uL (ref 150–400)
RBC: 5.4 MIL/uL (ref 4.22–5.81)
RDW: 12.4 % (ref 11.5–15.5)
WBC: 13 10*3/uL — ABNORMAL HIGH (ref 4.0–10.5)
nRBC: 0 % (ref 0.0–0.2)

## 2020-11-19 LAB — BASIC METABOLIC PANEL
Anion gap: 12 (ref 5–15)
BUN: 12 mg/dL (ref 6–20)
CO2: 26 mmol/L (ref 22–32)
Calcium: 8.6 mg/dL — ABNORMAL LOW (ref 8.9–10.3)
Chloride: 96 mmol/L — ABNORMAL LOW (ref 98–111)
Creatinine, Ser: 0.83 mg/dL (ref 0.61–1.24)
GFR, Estimated: >60 mL/min (ref >60)
Glucose, Bld: 96 mg/dL (ref 70–99)
Potassium: 3.9 mmol/L (ref 3.5–5.1)
Sodium: 134 mmol/L — ABNORMAL LOW (ref 135–145)

## 2020-11-19 NOTE — Progress Notes (Signed)
PROGRESS NOTE    Paul Gilmore  OEH:[id number] DOB: 1975-12-28 DOA: 11/16/2020 PCP: Patient, No Pcp Per     Brief Narrative:  Paul Gilmore is a 44 y.o. male with medical history significant of HTN and history of diverticulitis. He presented with LLQ abdominal pain. Reports that it started about 4 days ago. He immediately recognized it as being similar to his previous bout with diverticulitis. So he changed his diet to liquid diet and took APAP. This did not help. His symptoms worsened through this morning, so he decided to come to the ED.  CT abdomen pelvis revealed acute diverticulitis.  He was started on empiric Rocephin, Flagyl and admitted to the hospital.  New events last 24 hours / Subjective: Feeling better, had a formed stool  Assessment & Plan:   Active Problems:   Diverticulitis   Recurrent diverticulitis -CT abdomen pelvis: soft tissue/mesenteric stranding in the region of the junction of the descending colon and sigmoid colon with involvement of the proximal sigmoid colon. No well-defined fluid collection. There is wall thickening in the mid to distal sigmoid colon. No abscess or perforation evident.  -Flagyl and Rocephin --> Zosyn  -WBC trending downward -General surgery following -Advance diet today  Essential hypertension -Continue amlodipine, Cozaar. Added HCTZ     DVT prophylaxis: Lovenox Code Status: Full code Family Communication: Spouse at bedside Disposition Plan:  Status is: Inpatient  Remains inpatient appropriate because:Ongoing active pain requiring inpatient pain management, IV treatments appropriate due to intensity of illness or inability to take PO and Inpatient level of care appropriate due to severity of illness   Dispo: The patient is from: Home              Anticipated d/c is to: Home              Anticipated d/c date is: 1 day              Patient currently is not medically stable to d/c.  Remains on IV antibiotics and IV pain medication.  Advance diet to full liquids today.      Consultants:   General surgery   Procedures:   None   Antimicrobials:  Anti-infectives (From admission, onward)   Start     Dose/Rate Route Frequency Ordered Stop   11/17/20 1400  piperacillin-tazobactam (ZOSYN) IVPB 3.375 g        3.375 g 12.5 mL/hr over 4 Hours Intravenous Every 8 hours 11/17/20 0920     11/17/20 1000  cefTRIAXone (ROCEPHIN) 2 g in sodium chloride 0.9 % 100 mL IVPB  Status:  Discontinued        2 g 200 mL/hr over 30 Minutes Intravenous Every 24 hours 11/16/20 1345 11/17/20 0920   11/16/20 1400  metroNIDAZOLE (FLAGYL) tablet 500 mg  Status:  Discontinued       "And" Linked Group Details   500 mg Oral Every 8 hours 11/16/20 1127 11/17/20 0920   11/16/20 1130  cefTRIAXone (ROCEPHIN) 2 g in sodium chloride 0.9 % 100 mL IVPB       "And" Linked Group Details   2 g 200 mL/hr over 30 Minutes Intravenous  Once 11/16/20 1127 11/16/20 1345       Objective: Vitals:   11/18/20 0305 11/18/20 1436 11/18/20 2200 11/19/20 0626  BP: (!) 172/112 (!) 157/97 (!) 155/92 (!) 157/99  Pulse: 99 88 90 74  Resp: 20 18 18 17   Temp: 97.8 F (36.6 C) 97.6 F (36.4 C) (!) 97.5  F (36.4 C) 97.8 F (36.6 C)  TempSrc: Oral Oral Oral Oral  SpO2: 98% 98% 98% 97%  Weight:      Height:        Intake/Output Summary (Last 24 hours) at 11/19/2020 0946 Last data filed at 11/19/2020 0600 Gross per 24 hour  Intake 3056.9 ml  Output 2 ml  Net 3054.9 ml   Filed Weights   11/16/20 0743  Weight: 120.2 kg   Examination: General exam: Appears calm and comfortable  Respiratory system: Clear to auscultation. Respiratory effort normal. Cardiovascular system: S1 & S2 heard, RRR. No pedal edema. Gastrointestinal system: Abdomen is nondistended, soft and nontender. Normal bowel sounds heard. Central nervous system: Alert and oriented. Non focal exam. Speech clear  Extremities: Symmetric in appearance bilaterally  Skin: No rashes, lesions or  ulcers on exposed skin  Psychiatry: Judgement and insight appear stable. Mood & affect appropriate.   Data Reviewed: I have personally reviewed following labs and imaging studies  CBC: Recent Labs  Lab 11/16/20 0858 11/17/20 0524 11/18/20 0533 11/19/20 0506  WBC 15.3* 26.9* 18.6* 13.0*  HGB 17.8* 18.4* 16.9 16.4  HCT 51.0 54.0* 49.1 48.2  MCV 87.3 88.2 88.3 89.3  PLT 373 466* 337 338   Basic Metabolic Panel: Recent Labs  Lab 11/16/20 0858 11/17/20 2214 11/18/20 0533 11/19/20 0506  NA 134* 132* 135 134*  K 4.5 4.0 3.8 3.9  CL 100 99 99 96*  CO2 25 22 23 26   GLUCOSE 110* 117* 106* 96  BUN 15 14 13 12   CREATININE 0.93 0.76 0.79 0.83  CALCIUM 9.1 8.6* 8.6* 8.6*   GFR: Estimated Creatinine Clearance: 148.3 mL/min (by C-G formula based on SCr of 0.83 mg/dL). Liver Function Tests: Recent Labs  Lab 11/16/20 0858 11/17/20 2214  AST 16 16  ALT 15 14  ALKPHOS 91 77  BILITOT 1.3* 0.8  PROT 7.6 6.8  ALBUMIN 4.3 3.6   Recent Labs  Lab 11/16/20 0858  LIPASE 20   No results for input(s): AMMONIA in the last 168 hours. Coagulation Profile: No results for input(s): INR, PROTIME in the last 168 hours. Cardiac Enzymes: No results for input(s): CKTOTAL, CKMB, CKMBINDEX, TROPONINI in the last 168 hours. BNP (last 3 results) No results for input(s): PROBNP in the last 8760 hours. HbA1C: No results for input(s): HGBA1C in the last 72 hours. CBG: No results for input(s): GLUCAP in the last 168 hours. Lipid Profile: No results for input(s): CHOL, HDL, LDLCALC, TRIG, CHOLHDL, LDLDIRECT in the last 72 hours. Thyroid Function Tests: No results for input(s): TSH, T4TOTAL, FREET4, T3FREE, THYROIDAB in the last 72 hours. Anemia Panel: No results for input(s): VITAMINB12, FOLATE, FERRITIN, TIBC, IRON, RETICCTPCT in the last 72 hours. Sepsis Labs: No results for input(s): PROCALCITON, LATICACIDVEN in the last 168 hours.  Recent Results (from the past 240 hour(s))  Resp Panel  by RT-PCR (Flu A&B, Covid) Nasopharyngeal Swab     Status: None   Collection Time: 11/16/20  2:50 PM   Specimen: Nasopharyngeal Swab; Nasopharyngeal(NP) swabs in vial transport medium  Result Value Ref Range Status   SARS Coronavirus 2 by RT PCR NEGATIVE NEGATIVE Final    Comment: (NOTE) SARS-CoV-2 target nucleic acids are NOT DETECTED.  The SARS-CoV-2 RNA is generally detectable in upper respiratory specimens during the acute phase of infection. The lowest concentration of SARS-CoV-2 viral copies this assay can detect is 138 copies/mL. A negative result does not preclude SARS-Cov-2 infection and should not be used as the sole  basis for treatment or other patient management decisions. A negative result may occur with  improper specimen collection/handling, submission of specimen other than nasopharyngeal swab, presence of viral mutation(s) within the areas targeted by this assay, and inadequate number of viral copies(<138 copies/mL). A negative result must be combined with clinical observations, patient history, and epidemiological information. The expected result is Negative.  Fact Sheet for Patients:  BloggerCourse.com  Fact Sheet for Healthcare Providers:  SeriousBroker.it  This test is no t yet approved or cleared by the [place] FDA and  has been authorized for detection and/or diagnosis of SARS-CoV-2 by FDA under an Emergency Use Authorization (EUA). This EUA will remain  in effect (meaning this test can be used) for the duration of the COVID-19 declaration under Section 564(b)(1) of the Act, 21 U.S.C.section 360bbb-3(b)(1), unless the authorization is terminated  or revoked sooner.       Influenza A by PCR NEGATIVE NEGATIVE Final   Influenza B by PCR NEGATIVE NEGATIVE Final    Comment: (NOTE) The Xpert Xpress SARS-CoV-2/FLU/RSV plus assay is intended as an aid in the diagnosis of influenza from Nasopharyngeal swab  specimens and should not be used as a sole basis for treatment. Nasal washings and aspirates are unacceptable for Xpert Xpress SARS-CoV-2/FLU/RSV testing.  Fact Sheet for Patients: BloggerCourse.com  Fact Sheet for Healthcare Providers: SeriousBroker.it  This test is not yet approved or cleared by the [place] FDA and has been authorized for detection and/or diagnosis of SARS-CoV-2 by FDA under an Emergency Use Authorization (EUA). This EUA will remain in effect (meaning this test can be used) for the duration of the COVID-19 declaration under Section 564(b)(1) of the Act, 21 U.S.C. section 360bbb-3(b)(1), unless the authorization is terminated or revoked.  Performed at [facility], [address], [place], [place] [address]       Radiology Studies: No results found.    Scheduled Meds: . amLODipine  10 mg Oral Daily  . docusate sodium  100 mg Oral BID  . enoxaparin (LOVENOX) injection  60 mg Subcutaneous Q24H  . hydrochlorothiazide  12.5 mg Oral Daily  . losartan  50 mg Oral Daily   Continuous Infusions: . piperacillin-tazobactam 3.375 g (11/19/20 0431)     LOS: 3 days      Time spent: 20 minutes   Noralee Stain, DO [facility] 11/19/2020, 9:46 AM   Available via Epic secure chat 7am-7pm After these hours, please refer to coverage provider listed on [email or web address]

## 2020-11-19 NOTE — Progress Notes (Signed)
   Subjective/Chief Complaint: Still having some intermittent LLQ pains but getting better   Objective: Vital signs in last 24 hours: Temp:  [97.5 F (36.4 C)-97.6 F (36.4 C)] 97.5 F (36.4 C) (12/24 2200) Pulse Rate:  [88-90] 90 (12/24 2200) Resp:  [18] 18 (12/24 2200) BP: (155-157)/(92-97) 155/92 (12/24 2200) SpO2:  [98 %] 98 % (12/24 2200) Last BM Date: 11/17/20  Intake/Output from previous day: 12/24 0701 - 12/25 0700 In: 3056.9 [P.O.:1320; I.V.:1490; IV Piggyback:246.9] Out: 2 [Urine:1; Stool:1] Intake/Output this shift: Total I/O In: 1086.8 [P.O.:360; I.V.:620.4; IV Piggyback:106.4] Out: 2 [Urine:1; Stool:1]  General appearance: alert and cooperative Resp: clear to auscultation bilaterally Cardio: regular rate and rhythm GI: soft, mild LLQ tenderness  Lab Results:  Recent Labs    11/18/20 0533 11/19/20 0506  WBC 18.6* 13.0*  HGB 16.9 16.4  HCT 49.1 48.2  PLT 337 338   BMET Recent Labs    11/18/20 0533 11/19/20 0506  NA 135 134*  K 3.8 3.9  CL 99 96*  CO2 23 26  GLUCOSE 106* 96  BUN 13 12  CREATININE 0.79 0.83  CALCIUM 8.6* 8.6*   PT/INR No results for input(s): LABPROT, INR in the last 72 hours. ABG No results for input(s): PHART, HCO3 in the last 72 hours.  Invalid input(s): PCO2, PO2  Studies/Results: No results found.  Anti-infectives: Anti-infectives (From admission, onward)   Start     Dose/Rate Route Frequency Ordered Stop   11/17/20 1400  piperacillin-tazobactam (ZOSYN) IVPB 3.375 g        3.375 g 12.5 mL/hr over 4 Hours Intravenous Every 8 hours 11/17/20 0920     11/17/20 1000  cefTRIAXone (ROCEPHIN) 2 g in sodium chloride 0.9 % 100 mL IVPB  Status:  Discontinued        2 g 200 mL/hr over 30 Minutes Intravenous Every 24 hours 11/16/20 1345 11/17/20 0920   11/16/20 1400  metroNIDAZOLE (FLAGYL) tablet 500 mg  Status:  Discontinued       "And" Linked Group Details   500 mg Oral Every 8 hours 11/16/20 1127 11/17/20 0920    11/16/20 1130  cefTRIAXone (ROCEPHIN) 2 g in sodium chloride 0.9 % 100 mL IVPB       "And" Linked Group Details   2 g 200 mL/hr over 30 Minutes Intravenous  Once 11/16/20 1127 11/16/20 1345      Assessment/Plan: s/p * No surgery found * Advance diet. Allow fulls today Continue IV abx until pain is better and wbc is normal Ambulate Sigmoid diverticulitis  LOS: 3 days    Paul Gilmore 11/19/2020

## 2020-11-20 DIAGNOSIS — K5792 Diverticulitis of intestine, part unspecified, without perforation or abscess without bleeding: Secondary | ICD-10-CM | POA: Diagnosis not present

## 2020-11-20 LAB — BASIC METABOLIC PANEL
Anion gap: 11 (ref 5–15)
BUN: 12 mg/dL (ref 6–20)
CO2: 30 mmol/L (ref 22–32)
Calcium: 8.9 mg/dL (ref 8.9–10.3)
Chloride: 96 mmol/L — ABNORMAL LOW (ref 98–111)
Creatinine, Ser: 0.99 mg/dL (ref 0.61–1.24)
GFR, Estimated: >60 mL/min (ref >60)
Glucose, Bld: 110 mg/dL — ABNORMAL HIGH (ref 70–99)
Potassium: 3.9 mmol/L (ref 3.5–5.1)
Sodium: 137 mmol/L (ref 135–145)

## 2020-11-20 LAB — CBC
HCT: 48.6 % (ref 39.0–52.0)
Hemoglobin: 16.6 g/dL (ref 13.0–17.0)
MCH: 30.4 pg (ref 26.0–34.0)
MCHC: 34.2 g/dL (ref 30.0–36.0)
MCV: 89 fL (ref 80.0–100.0)
Platelets: 366 10*3/uL (ref 150–400)
RBC: 5.46 MIL/uL (ref 4.22–5.81)
RDW: 12.6 % (ref 11.5–15.5)
WBC: 10.9 10*3/uL — ABNORMAL HIGH (ref 4.0–10.5)
nRBC: 0 % (ref 0.0–0.2)

## 2020-11-20 NOTE — Progress Notes (Signed)
PROGRESS NOTE    Paul Gilmore  JOA:[id number] DOB: 1976-06-05 DOA: 11/16/2020 PCP: Patient, No Pcp Per     Brief Narrative:  Paul Gilmore is a 44 y.o. male with medical history significant of HTN and history of diverticulitis. He presented with LLQ abdominal pain. Reports that it started about 4 days ago. He immediately recognized it as being similar to his previous bout with diverticulitis. So he changed his diet to liquid diet and took APAP. This did not help. His symptoms worsened through this morning, so he decided to come to the ED.  CT abdomen pelvis revealed acute diverticulitis.  He was started on empiric Rocephin, Flagyl and admitted to the hospital.  New events last 24 hours / Subjective: Having formed stool, feeling better overall, sitting in recliner and about to eat breakfast  Assessment & Plan:   Active Problems:   Diverticulitis   Recurrent diverticulitis -CT abdomen pelvis: soft tissue/mesenteric stranding in the region of the junction of the descending colon and sigmoid colon with involvement of the proximal sigmoid colon. No well-defined fluid collection. There is wall thickening in the mid to distal sigmoid colon. No abscess or perforation evident.  -Flagyl and Rocephin --> Zosyn  -WBC trending downward -General surgery following -Continue to advance diet  Essential hypertension -Continue amlodipine, Cozaar. Added HCTZ     DVT prophylaxis: Lovenox Code Status: Full code Family Communication: Spouse at bedside Disposition Plan:  Status is: Inpatient  Remains inpatient appropriate because:IV treatments appropriate due to intensity of illness or inability to take PO   Dispo: The patient is from: Home              Anticipated d/c is to: Home              Anticipated d/c date is: 1 day              Patient currently is not medically stable to d/c.  Remains on IV antibiotics for 1 more day.  Advance diet to soft diet today.  Likely discharge home  12/27.      Consultants:   General surgery   Procedures:   None   Antimicrobials:  Anti-infectives (From admission, onward)   Start     Dose/Rate Route Frequency Ordered Stop   11/17/20 1400  piperacillin-tazobactam (ZOSYN) IVPB 3.375 g        3.375 g 12.5 mL/hr over 4 Hours Intravenous Every 8 hours 11/17/20 0920     11/17/20 1000  cefTRIAXone (ROCEPHIN) 2 g in sodium chloride 0.9 % 100 mL IVPB  Status:  Discontinued        2 g 200 mL/hr over 30 Minutes Intravenous Every 24 hours 11/16/20 1345 11/17/20 0920   11/16/20 1400  metroNIDAZOLE (FLAGYL) tablet 500 mg  Status:  Discontinued       "And" Linked Group Details   500 mg Oral Every 8 hours 11/16/20 1127 11/17/20 0920   11/16/20 1130  cefTRIAXone (ROCEPHIN) 2 g in sodium chloride 0.9 % 100 mL IVPB       "And" Linked Group Details   2 g 200 mL/hr over 30 Minutes Intravenous  Once 11/16/20 1127 11/16/20 1345       Objective: Vitals:   11/19/20 0626 11/19/20 1325 11/19/20 2146 11/20/20 0543  BP: (!) 157/99 (!) 141/82 136/87 (!) 130/93  Pulse: 74 81 66 61  Resp: 17 16 18 17   Temp: 97.8 F (36.6 C) 98.7 F (37.1 C) 97.7 F (36.5 C) 98.7 F (  37.1 C)  TempSrc: Oral Oral Oral Oral  SpO2: 97% 97% 97% 95%  Weight:      Height:        Intake/Output Summary (Last 24 hours) at 11/20/2020 1039 Last data filed at 11/20/2020 0617 Gross per 24 hour  Intake 110.85 ml  Output --  Net 110.85 ml   Filed Weights   11/16/20 0743  Weight: 120.2 kg   Examination: General exam: Appears calm and comfortable  Respiratory system: Clear to auscultation. Respiratory effort normal. Cardiovascular system: S1 & S2 heard, RRR. No pedal edema. Gastrointestinal system: Abdomen is nondistended, soft and nontender. Normal bowel sounds heard. Central nervous system: Alert and oriented. Non focal exam. Speech clear  Extremities: Symmetric in appearance bilaterally  Skin: No rashes, lesions or ulcers on exposed skin  Psychiatry:  Judgement and insight appear stable. Mood & affect appropriate.    Data Reviewed: I have personally reviewed following labs and imaging studies  CBC: Recent Labs  Lab 11/16/20 0858 11/17/20 0524 11/18/20 0533 11/19/20 0506 11/20/20 0502  WBC 15.3* 26.9* 18.6* 13.0* 10.9*  HGB 17.8* 18.4* 16.9 16.4 16.6  HCT 51.0 54.0* 49.1 48.2 48.6  MCV 87.3 88.2 88.3 89.3 89.0  PLT 373 466* 337 338 366   Basic Metabolic Panel: Recent Labs  Lab 11/16/20 0858 11/17/20 2214 11/18/20 0533 11/19/20 0506 11/20/20 0502  NA 134* 132* 135 134* 137  K 4.5 4.0 3.8 3.9 3.9  CL 100 99 99 96* 96*  CO2 25 22 23 26 30   GLUCOSE 110* 117* 106* 96 110*  BUN 15 14 13 12 12   CREATININE 0.93 0.76 0.79 0.83 0.99  CALCIUM 9.1 8.6* 8.6* 8.6* 8.9   GFR: Estimated Creatinine Clearance: 124.3 mL/min (by C-G formula based on SCr of 0.99 mg/dL). Liver Function Tests: Recent Labs  Lab 11/16/20 0858 11/17/20 2214  AST 16 16  ALT 15 14  ALKPHOS 91 77  BILITOT 1.3* 0.8  PROT 7.6 6.8  ALBUMIN 4.3 3.6   Recent Labs  Lab 11/16/20 0858  LIPASE 20   No results for input(s): AMMONIA in the last 168 hours. Coagulation Profile: No results for input(s): INR, PROTIME in the last 168 hours. Cardiac Enzymes: No results for input(s): CKTOTAL, CKMB, CKMBINDEX, TROPONINI in the last 168 hours. BNP (last 3 results) No results for input(s): PROBNP in the last 8760 hours. HbA1C: No results for input(s): HGBA1C in the last 72 hours. CBG: No results for input(s): GLUCAP in the last 168 hours. Lipid Profile: No results for input(s): CHOL, HDL, LDLCALC, TRIG, CHOLHDL, LDLDIRECT in the last 72 hours. Thyroid Function Tests: No results for input(s): TSH, T4TOTAL, FREET4, T3FREE, THYROIDAB in the last 72 hours. Anemia Panel: No results for input(s): VITAMINB12, FOLATE, FERRITIN, TIBC, IRON, RETICCTPCT in the last 72 hours. Sepsis Labs: No results for input(s): PROCALCITON, LATICACIDVEN in the last 168  hours.  Recent Results (from the past 240 hour(s))  Resp Panel by RT-PCR (Flu A&B, Covid) Nasopharyngeal Swab     Status: None   Collection Time: 11/16/20  2:50 PM   Specimen: Nasopharyngeal Swab; Nasopharyngeal(NP) swabs in vial transport medium  Result Value Ref Range Status   SARS Coronavirus 2 by RT PCR NEGATIVE NEGATIVE Final    Comment: (NOTE) SARS-CoV-2 target nucleic acids are NOT DETECTED.  The SARS-CoV-2 RNA is generally detectable in upper respiratory specimens during the acute phase of infection. The lowest concentration of SARS-CoV-2 viral copies this assay can detect is 138 copies/mL. A negative result does  not preclude SARS-Cov-2 infection and should not be used as the sole basis for treatment or other patient management decisions. A negative result may occur with  improper specimen collection/handling, submission of specimen other than nasopharyngeal swab, presence of viral mutation(s) within the areas targeted by this assay, and inadequate number of viral copies(<138 copies/mL). A negative result must be combined with clinical observations, patient history, and epidemiological information. The expected result is Negative.  Fact Sheet for Patients:  BloggerCourse.com  Fact Sheet for Healthcare Providers:  SeriousBroker.it  This test is no t yet approved or cleared by the [place] FDA and  has been authorized for detection and/or diagnosis of SARS-CoV-2 by FDA under an Emergency Use Authorization (EUA). This EUA will remain  in effect (meaning this test can be used) for the duration of the COVID-19 declaration under Section 564(b)(1) of the Act, 21 U.S.C.section 360bbb-3(b)(1), unless the authorization is terminated  or revoked sooner.       Influenza A by PCR NEGATIVE NEGATIVE Final   Influenza B by PCR NEGATIVE NEGATIVE Final    Comment: (NOTE) The Xpert Xpress SARS-CoV-2/FLU/RSV plus assay is intended  as an aid in the diagnosis of influenza from Nasopharyngeal swab specimens and should not be used as a sole basis for treatment. Nasal washings and aspirates are unacceptable for Xpert Xpress SARS-CoV-2/FLU/RSV testing.  Fact Sheet for Patients: BloggerCourse.com  Fact Sheet for Healthcare Providers: SeriousBroker.it  This test is not yet approved or cleared by the [place] FDA and has been authorized for detection and/or diagnosis of SARS-CoV-2 by FDA under an Emergency Use Authorization (EUA). This EUA will remain in effect (meaning this test can be used) for the duration of the COVID-19 declaration under Section 564(b)(1) of the Act, 21 U.S.C. section 360bbb-3(b)(1), unless the authorization is terminated or revoked.  Performed at [facility], [address], [place], [place] [address]       Radiology Studies: No results found.    Scheduled Meds: . amLODipine  10 mg Oral Daily  . docusate sodium  100 mg Oral BID  . enoxaparin (LOVENOX) injection  60 mg Subcutaneous Q24H  . hydrochlorothiazide  12.5 mg Oral Daily  . losartan  50 mg Oral Daily   Continuous Infusions: . piperacillin-tazobactam 12.5 mL/hr at 11/20/20 0617     LOS: 4 days      Time spent: 20 minutes   Noralee Stain, DO [facility] 11/20/2020, 10:39 AM   Available via Epic secure chat 7am-7pm After these hours, please refer to coverage provider listed on [email or web address]

## 2020-11-20 NOTE — Progress Notes (Addendum)
Patient ID: Paul Gilmore, male   DOB: Apr 11, 1976, 44 y.o.   MRN: [id number]   Acute Care Surgery Service Progress Note:    Chief Complaint/Subjective: Reports much less abd pain No n/v Reports lots of flatus and had BM yesterday am Slept well  Objective: Vital signs in last 24 hours: Temp:  [97.7 F (36.5 C)-98.7 F (37.1 C)] 98.7 F (37.1 C) (12/26 0543) Pulse Rate:  [61-81] 61 (12/26 0543) Resp:  [16-18] 17 (12/26 0543) BP: (130-141)/(82-93) 130/93 (12/26 0543) SpO2:  [95 %-97 %] 95 % (12/26 0543) Last BM Date: 11/19/20  Intake/Output from previous day: 12/25 0701 - 12/26 0700 In: 143.9 [IV Piggyback:143.9] Out: -  Intake/Output this shift: No intake/output data recorded.  Lungs:  nonlabored  Cardiovascular: reg  Abd: soft, obese, tenderness essentially resolved  Extremities: no edema, +SCDs  Neuro: alert, nonfocal  Lab Results: CBC  Recent Labs    11/19/20 0506 11/20/20 0502  WBC 13.0* 10.9*  HGB 16.4 16.6  HCT 48.2 48.6  PLT 338 366   BMET Recent Labs    11/19/20 0506 11/20/20 0502  NA 134* 137  K 3.9 3.9  CL 96* 96*  CO2 26 30  GLUCOSE 96 110*  BUN 12 12  CREATININE 0.83 0.99  CALCIUM 8.6* 8.9   LFT Hepatic Function Latest Ref Rng & Units 11/17/2020 11/16/2020 10/30/2018  Total Protein 6.5 - 8.1 g/dL 6.8 7.6 7.5  Albumin 3.5 - 5.0 g/dL 3.6 4.3 3.9  AST 15 - 41 U/L $Remo'16 16 20  'Mlzyg$ ALT 0 - 44 U/L $Remo'14 15 25  'sJapR$ Alk Phosphatase 38 - 126 U/L 77 91 78  Total Bilirubin 0.3 - 1.2 mg/dL 0.8 1.3(H) 0.5   PT/INR No results for input(s): LABPROT, INR in the last 72 hours. ABG No results for input(s): PHART, HCO3 in the last 72 hours.  Invalid input(s): PCO2, PO2  Studies/Results:  Anti-infectives: Anti-infectives (From admission, onward)   Start     Dose/Rate Route Frequency Ordered Stop   11/17/20 1400  piperacillin-tazobactam (ZOSYN) IVPB 3.375 g        3.375 g 12.5 mL/hr over 4 Hours Intravenous Every 8 hours 11/17/20 0920     11/17/20 1000   cefTRIAXone (ROCEPHIN) 2 g in sodium chloride 0.9 % 100 mL IVPB  Status:  Discontinued        2 g 200 mL/hr over 30 Minutes Intravenous Every 24 hours 11/16/20 1345 11/17/20 0920   11/16/20 1400  metroNIDAZOLE (FLAGYL) tablet 500 mg  Status:  Discontinued       "And" Linked Group Details   500 mg Oral Every 8 hours 11/16/20 1127 11/17/20 0920   11/16/20 1130  cefTRIAXone (ROCEPHIN) 2 g in sodium chloride 0.9 % 100 mL IVPB       "And" Linked Group Details   2 g 200 mL/hr over 30 Minutes Intravenous  Once 11/16/20 1127 11/16/20 1345      Medications: Scheduled Meds: . amLODipine  10 mg Oral Daily  . docusate sodium  100 mg Oral BID  . enoxaparin (LOVENOX) injection  60 mg Subcutaneous Q24H  . hydrochlorothiazide  12.5 mg Oral Daily  . losartan  50 mg Oral Daily   Continuous Infusions: . piperacillin-tazobactam 12.5 mL/hr at 11/20/20 0617   PRN Meds:.acetaminophen **OR** acetaminophen, albuterol, labetalol, morphine injection, ondansetron **OR** ondansetron (ZOFRAN) IV, simethicone, traMADol  Assessment/Plan: Patient Active Problem List   Diagnosis Date Noted  . Chronic idiopathic gout involving toe of left foot without tophus 01/26/2020  .  Essential hypertension 10/27/2018  . Diverticulitis 10/09/2018  . Thoracic back pain 04/01/2018  . Diverticulitis of intestine with abscess 03/22/2018  . Obesity, Class III, BMI 40-49.9 (morbid obesity) ([place]) 03/08/2018  . Perforation of sigmoid colon due to diverticulitis 03/03/2018  . Hyperglycemia 03/03/2018  . Right foot pain 06/14/2017  . Great toe pain, left 03/04/2017  . Lower back injury 09/24/2016  . Hand laceration 09/24/2016  . Neck pain 02/08/2014   HTN GERD Obesity - BMI 36.96  Sigmoid diverticulitis without perforation or abscess - CT 12/22 shows colonic wall thickening in sigmoid and distal descending colon - WBC was 15.3 --> 26.9 -->18.6---->13->10.9, afebrile -  - FEN: adv to soft diet, IVF VTE: SCDs,  lovenox ID: IV rocephin/PO flagyl 12/22>12/23; IV Zosyn 12/23>> Disposition: improving. Adv diet, ambulate, encourage PO pain meds over IV, cont IV abx; anticipate dc Monday; will need to finish oral abx course as outpt, has f/u scheduled with Dr Dema Severin    LOS: 4 days    Leighton Ruff. Redmond Pulling, MD, FACS General, Bariatric, & Minimally Invasive Surgery [phone] [facility]

## 2020-11-21 DIAGNOSIS — K5792 Diverticulitis of intestine, part unspecified, without perforation or abscess without bleeding: Secondary | ICD-10-CM | POA: Diagnosis not present

## 2020-11-21 LAB — CBC
HCT: 48.1 % (ref 39.0–52.0)
Hemoglobin: 15.8 g/dL (ref 13.0–17.0)
MCH: 30.5 pg (ref 26.0–34.0)
MCHC: 32.8 g/dL (ref 30.0–36.0)
MCV: 92.9 fL (ref 80.0–100.0)
Platelets: 383 10*3/uL (ref 150–400)
RBC: 5.18 MIL/uL (ref 4.22–5.81)
RDW: 12.4 % (ref 11.5–15.5)
WBC: 10.5 10*3/uL (ref 4.0–10.5)
nRBC: 0 % (ref 0.0–0.2)

## 2020-11-21 MED ORDER — ACETAMINOPHEN 500 MG PO TABS
1000.0000 mg | ORAL_TABLET | Freq: Four times a day (QID) | ORAL | Status: DC
  Administered 2020-11-21: 1000 mg via ORAL
  Filled 2020-11-21: qty 2

## 2020-11-21 MED ORDER — OXYCODONE HCL 5 MG PO TABS
5.0000 mg | ORAL_TABLET | ORAL | 0 refills | Status: AC | PRN
Start: 2020-11-21 — End: 2020-11-24

## 2020-11-21 MED ORDER — AMOXICILLIN-POT CLAVULANATE 875-125 MG PO TABS: 1.0000 | ORAL_TABLET | Freq: Two times a day (BID) | ORAL | 0 refills | Status: AC

## 2020-11-21 MED ORDER — OXYCODONE HCL 5 MG PO TABS
5.0000 mg | ORAL_TABLET | ORAL | Status: DC | PRN
  Administered 2020-11-21: 5 mg via ORAL
  Filled 2020-11-21: qty 1

## 2020-11-21 MED ORDER — HYDROCHLOROTHIAZIDE 12.5 MG PO CAPS: 12.5000 mg | ORAL_CAPSULE | Freq: Every day | ORAL | 2 refills | Status: DC

## 2020-11-21 MED ORDER — AMLODIPINE BESYLATE 10 MG PO TABS: 10.0000 mg | ORAL_TABLET | Freq: Every day | ORAL | 2 refills | Status: DC

## 2020-11-21 MED ORDER — LOSARTAN POTASSIUM 50 MG PO TABS: 50.0000 mg | ORAL_TABLET | Freq: Every day | ORAL | 2 refills | Status: DC

## 2020-11-21 NOTE — Discharge Summary (Signed)
Physician Discharge Summary  Paul Gilmore ZOX:[id number] DOB: [birth year] DOA: 11/16/2020  PCP: Patient, No Pcp Per  Admit date: 11/16/2020 Discharge date: 11/21/2020  Admitted From: Home Disposition:  Home  Recommendations for Outpatient Follow-up:  1. Follow up with PCP in 1 week 2. Follow up with Dr. Cliffton Asters, general surgery  Discharge Condition: Stable CODE STATUS: Full  Diet recommendation:  Diet Orders (From admission, onward)    Start     Ordered   11/20/20 0826  DIET SOFT Room service appropriate? Yes; Fluid consistency: Thin  Diet effective now       Question Answer Comment  Room service appropriate? Yes   Fluid consistency: Thin      11/20/20 0825         Brief/Interim Summary: Paul Gilmore is a 44 y.o.malewith medical history significant ofHTN and history of diverticulitis. He presented with LLQ abdominal pain. Reports that it started about 4 days ago. He immediately recognized it as being similar to his previous bout with diverticulitis. So he changed his diet to liquid diet and took APAP. This did not help. His symptoms worsened through this morning, so he decided to come to the ED. CT abdomen pelvis revealed acute diverticulitis.  He was started on empiric Rocephin, Flagyl and admitted to the hospital. General surgery consulted. Antibiotics were increased to zosyn. Patient continued to have clinical improvement. Leukocytosis resolved, pain improved, and diet advanced prior to discharge.   Discharge Diagnoses:  Active Problems:   Diverticulitis   Recurrent diverticulitis -CT abdomen pelvis: soft tissue/mesenteric stranding in the region of the junction of the descending colon and sigmoid colon with involvement of the proximal sigmoid colon. No well-defined fluid collection. There is wall thickening in the mid to distal sigmoid colon. No abscess or perforation evident.  -Flagyl and Rocephin --> Zosyn --> Augmentin  -WBC normalized -Follow up with general surgery    Essential hypertension -Continue amlodipine, Cozaar, HCTZ    Discharge Instructions  Discharge Instructions    Call MD for:  difficulty breathing, headache or visual disturbances   Complete by: As directed    Call MD for:  extreme fatigue   Complete by: As directed    Call MD for:  persistant dizziness or light-headedness   Complete by: As directed    Call MD for:  persistant nausea and vomiting   Complete by: As directed    Call MD for:  severe uncontrolled pain   Complete by: As directed    Call MD for:  temperature >100.4   Complete by: As directed    Discharge instructions   Complete by: As directed    You were cared for by a hospitalist during your hospital stay. If you have any questions about your discharge medications or the care you received while you were in the hospital after you are discharged, you can call the unit and ask to speak with the hospitalist on call if the hospitalist that took care of you is not available. Once you are discharged, your primary care physician will handle any further medical issues. Please note that NO REFILLS for any discharge medications will be authorized once you are discharged, as it is imperative that you return to your primary care physician (or establish a relationship with a primary care physician if you do not have one) for your aftercare needs so that they can reassess your need for medications and monitor your lab values.   Increase activity slowly   Complete by: As directed  Allergies as of 11/21/2020   No Known Allergies     Medication List    STOP taking these medications   cyclobenzaprine 10 MG tablet Commonly known as: FLEXERIL   diclofenac 75 MG EC tablet Commonly known as: VOLTAREN   HYDROcodone-acetaminophen 7.5-325 MG tablet Commonly known as: Norco     TAKE these medications   acetaminophen 500 MG tablet Commonly known as: TYLENOL Take 1,000 mg by mouth every 8 (eight) hours as needed for moderate  pain. What changed: Another medication with the same name was removed. Continue taking this medication, and follow the directions you see here.   albuterol 108 (90 Base) MCG/ACT inhaler Commonly known as: VENTOLIN HFA Inhale 1 puff into the lungs every 6 (six) hours as needed for wheezing or shortness of breath.   amLODipine 10 MG tablet Commonly known as: Norvasc Take 1 tablet (10 mg total) by mouth daily. What changed:   medication strength  how much to take   amoxicillin-clavulanate 875-125 MG tablet Commonly known as: Augmentin Take 1 tablet by mouth 2 (two) times daily for 8 days.   hydrochlorothiazide 12.5 MG capsule Commonly known as: MICROZIDE Take 1 capsule (12.5 mg total) by mouth daily.   losartan 50 MG tablet Commonly known as: COZAAR Take 1 tablet (50 mg total) by mouth daily.   oxyCODONE 5 MG immediate release tablet Commonly known as: Oxy IR/ROXICODONE Take 1 tablet (5 mg total) by mouth every 4 (four) hours as needed for up to 3 days for severe pain or moderate pain (no relieved by tylenol).       Follow-up Information    Andria Meuse, MD. Go on 12/20/2020.   Specialty: General Surgery Why: Appointment to discuss elective partial colectomy scheduled for 10:45 AM. Please arrive 30 min prior to appointment time for check in. Bring photo ID and insurance information.  Contact information: [address] [place] [address] [phone]              No Known Allergies  Consultations:  General Surgery    Procedures/Studies: CT ABDOMEN PELVIS W CONTRAST  Result Date: 11/16/2020 CLINICAL DATA:  Left lower abdominal pain. EXAM: CT ABDOMEN AND PELVIS WITH CONTRAST TECHNIQUE: Multidetector CT imaging of the abdomen and pelvis was performed using the standard protocol following bolus administration of intravenous contrast. CONTRAST:  OMNIPAQUE IOHEXOL 300 MG/ML  SOLN COMPARISON:  October 27, 2018 FINDINGS: Lower chest: Lung bases are  clear. There are occasional foci of coronary artery calcification. Hepatobiliary: No focal liver lesions are appreciable. The gallbladder wall is not appreciably thickened. There is no biliary duct dilatation. Pancreas: There is no pancreatic mass or inflammatory focus. Spleen: No splenic lesions are evident. Adrenals/Urinary Tract: Adrenals bilaterally appear normal. There is no appreciable renal mass or hydronephrosis on either side. There is no appreciable renal or ureteral calculus on either side. Urinary bladder is midline with wall thickness within normal limits. Stomach/Bowel: There is wall thickening in the mid to distal sigmoid colon region with mild soft tissue stranding in this area. Proximal to this area of wall thickening, there is mesenteric thickening along the colon at the junction of the descending and sigmoid regions. There are occasional diverticula in these areas. There is somewhat generalized colonic dilatation proximal to this area of more distal inflammation. There is no appreciable small bowel dilatation. Most loops of small bowel are fluid-filled. There is no well-defined bowel obstruction. The terminal ileum appears unremarkable. There is no appreciable free air or  portal venous air evident. Vascular/Lymphatic: There is no abdominal aortic aneurysm. There is minimal aortic atherosclerosis. Major mesenteric vessels appear patent. Major venous structures appear patent. There is no evident adenopathy in the abdomen or pelvis. Reproductive: There are prostatic calculi. Prostate and seminal vesicles are normal in size and contour. Other: The appendix appears normal. There is no evident abscess or ascites in the abdomen or pelvis. There is slight fat in the umbilicus. Musculoskeletal: There is stable anterior wedging of the T12 vertebral body. No blastic or lytic bone lesions are appreciable. No intramuscular lesions evident. IMPRESSION: 1. There is soft tissue/mesenteric stranding in the region  of the junction of the descending colon and sigmoid colon with involvement of the proximal sigmoid colon. No well-defined fluid collection. There is wall thickening in the mid to distal sigmoid colon. No abscess or perforation evident. Appearance is consistent with a degree of colitis/diverticulitis involving much of the sigmoid colon and distal most aspect of descending colon. It should be noted that given the wall thickening in the mid to distal descending colon, an inflammatory neoplasm could be responsible for the other changes. It may be prudent to consider direct visualization of the sigmoid colon after treatment of acute inflammatory changes to assess for possible underlying neoplasm in this area. 2. Relative dilatation of the remainder of the colon. Colonic wall does not appear appreciably thickened more proximally. Relatively mild stool volume present. 3. Most small bowel loops are fluid-filled which may indicate a degree of enteritis/ileus. No small bowel obstruction. Terminal ileum region appears normal. No small bowel or caval area 4. No abscess in the abdomen or pelvis. Appendix appears normal. No free air. 5.  Stable anterior wedging of the T12 vertebral body. Electronically Signed   By: Bretta BangWilliam  Woodruff III M.D.   On: 11/16/2020 11:16       Discharge Exam: Vitals:   11/20/20 2113 11/21/20 0539  BP: 129/90 (!) 142/94  Pulse: 84 69  Resp: 17 18  Temp: 98.2 F (36.8 C) 98.6 F (37 C)  SpO2: 95% 97%    General: Pt is alert, awake, not in acute distress Cardiovascular: RRR, S1/S2 +, no edema Respiratory: CTA bilaterally, no wheezing, no rhonchi, no respiratory distress, no conversational dyspnea  Abdominal: Soft, NT, ND, bowel sounds + Extremities: no edema, no cyanosis Psych: Normal mood and affect, stable judgement and insight     The results of significant diagnostics from this hospitalization (including imaging, microbiology, ancillary and laboratory) are listed below for  reference.     Microbiology: Recent Results (from the past 240 hour(s))  Resp Panel by RT-PCR (Flu A&B, Covid) Nasopharyngeal Swab     Status: None   Collection Time: 11/16/20  2:50 PM   Specimen: Nasopharyngeal Swab; Nasopharyngeal(NP) swabs in vial transport medium  Result Value Ref Range Status   SARS Coronavirus 2 by RT PCR NEGATIVE NEGATIVE Final    Comment: (NOTE) SARS-CoV-2 target nucleic acids are NOT DETECTED.  The SARS-CoV-2 RNA is generally detectable in upper respiratory specimens during the acute phase of infection. The lowest concentration of SARS-CoV-2 viral copies this assay can detect is 138 copies/mL. A negative result does not preclude SARS-Cov-2 infection and should not be used as the sole basis for treatment or other patient management decisions. A negative result may occur with  improper specimen collection/handling, submission of specimen other than nasopharyngeal swab, presence of viral mutation(s) within the areas targeted by this assay, and inadequate number of viral copies(<138 copies/mL). A negative result must  be combined with clinical observations, patient history, and epidemiological information. The expected result is Negative.  Fact Sheet for Patients:  BloggerCourse.com  Fact Sheet for Healthcare Providers:  SeriousBroker.it  This test is no t yet approved or cleared by the [place] FDA and  has been authorized for detection and/or diagnosis of SARS-CoV-2 by FDA under an Emergency Use Authorization (EUA). This EUA will remain  in effect (meaning this test can be used) for the duration of the COVID-19 declaration under Section 564(b)(1) of the Act, 21 U.S.C.section 360bbb-3(b)(1), unless the authorization is terminated  or revoked sooner.       Influenza A by PCR NEGATIVE NEGATIVE Final   Influenza B by PCR NEGATIVE NEGATIVE Final    Comment: (NOTE) The Xpert Xpress SARS-CoV-2/FLU/RSV  plus assay is intended as an aid in the diagnosis of influenza from Nasopharyngeal swab specimens and should not be used as a sole basis for treatment. Nasal washings and aspirates are unacceptable for Xpert Xpress SARS-CoV-2/FLU/RSV testing.  Fact Sheet for Patients: BloggerCourse.com  Fact Sheet for Healthcare Providers: SeriousBroker.it  This test is not yet approved or cleared by the [place] FDA and has been authorized for detection and/or diagnosis of SARS-CoV-2 by FDA under an Emergency Use Authorization (EUA). This EUA will remain in effect (meaning this test can be used) for the duration of the COVID-19 declaration under Section 564(b)(1) of the Act, 21 U.S.C. section 360bbb-3(b)(1), unless the authorization is terminated or revoked.  Performed at [facility], [address], [place], [place] [address]      Labs: BNP (last 3 results) No results for input(s): BNP in the last 8760 hours. Basic Metabolic Panel: Recent Labs  Lab 11/16/20 0858 11/17/20 2214 11/18/20 0533 11/19/20 0506 11/20/20 0502  NA 134* 132* 135 134* 137  K 4.5 4.0 3.8 3.9 3.9  CL 100 99 99 96* 96*  CO2 25 22 23 26 30   GLUCOSE 110* 117* 106* 96 110*  BUN 15 14 13 12 12   CREATININE 0.93 0.76 0.79 0.83 0.99  CALCIUM 9.1 8.6* 8.6* 8.6* 8.9   Liver Function Tests: Recent Labs  Lab 11/16/20 0858 11/17/20 2214  AST 16 16  ALT 15 14  ALKPHOS 91 77  BILITOT 1.3* 0.8  PROT 7.6 6.8  ALBUMIN 4.3 3.6   Recent Labs  Lab 11/16/20 0858  LIPASE 20   No results for input(s): AMMONIA in the last 168 hours. CBC: Recent Labs  Lab 11/17/20 0524 11/18/20 0533 11/19/20 0506 11/20/20 0502 11/21/20 0420  WBC 26.9* 18.6* 13.0* 10.9* 10.5  HGB 18.4* 16.9 16.4 16.6 15.8  HCT 54.0* 49.1 48.2 48.6 48.1  MCV 88.2 88.3 89.3 89.0 92.9  PLT 466* 337 338 366 383   Cardiac Enzymes: No results for input(s): CKTOTAL, CKMB,  CKMBINDEX, TROPONINI in the last 168 hours. BNP: Invalid input(s): POCBNP CBG: No results for input(s): GLUCAP in the last 168 hours. D-Dimer No results for input(s): DDIMER in the last 72 hours. Hgb A1c No results for input(s): HGBA1C in the last 72 hours. Lipid Profile No results for input(s): CHOL, HDL, LDLCALC, TRIG, CHOLHDL, LDLDIRECT in the last 72 hours. Thyroid function studies No results for input(s): TSH, T4TOTAL, T3FREE, THYROIDAB in the last 72 hours.  Invalid input(s): FREET3 Anemia work up No results for input(s): VITAMINB12, FOLATE, FERRITIN, TIBC, IRON, RETICCTPCT in the last 72 hours. Urinalysis    Component Value Date/Time   COLORURINE BROWN (A) 11/16/2020 0954   APPEARANCEUR CLEAR 11/16/2020  0954   LABSPEC 1.030 11/16/2020 0954   PHURINE 5.0 11/16/2020 0954   GLUCOSEU NEGATIVE 11/16/2020 0954   HGBUR NEGATIVE 11/16/2020 0954   BILIRUBINUR SMALL (A) 11/16/2020 0954   KETONESUR 20 (A) 11/16/2020 0954   PROTEINUR 30 (A) 11/16/2020 0954   NITRITE NEGATIVE 11/16/2020 0954   LEUKOCYTESUR NEGATIVE 11/16/2020 0954   Sepsis Labs Invalid input(s): PROCALCITONIN,  WBC,  LACTICIDVEN Microbiology Recent Results (from the past 240 hour(s))  Resp Panel by RT-PCR (Flu A&B, Covid) Nasopharyngeal Swab     Status: None   Collection Time: 11/16/20  2:50 PM   Specimen: Nasopharyngeal Swab; Nasopharyngeal(NP) swabs in vial transport medium  Result Value Ref Range Status   SARS Coronavirus 2 by RT PCR NEGATIVE NEGATIVE Final    Comment: (NOTE) SARS-CoV-2 target nucleic acids are NOT DETECTED.  The SARS-CoV-2 RNA is generally detectable in upper respiratory specimens during the acute phase of infection. The lowest concentration of SARS-CoV-2 viral copies this assay can detect is 138 copies/mL. A negative result does not preclude SARS-Cov-2 infection and should not be used as the sole basis for treatment or other patient management decisions. A negative result may occur  with  improper specimen collection/handling, submission of specimen other than nasopharyngeal swab, presence of viral mutation(s) within the areas targeted by this assay, and inadequate number of viral copies(<138 copies/mL). A negative result must be combined with clinical observations, patient history, and epidemiological information. The expected result is Negative.  Fact Sheet for Patients:  BloggerCourse.com  Fact Sheet for Healthcare Providers:  SeriousBroker.it  This test is no t yet approved or cleared by the [place] FDA and  has been authorized for detection and/or diagnosis of SARS-CoV-2 by FDA under an Emergency Use Authorization (EUA). This EUA will remain  in effect (meaning this test can be used) for the duration of the COVID-19 declaration under Section 564(b)(1) of the Act, 21 U.S.C.section 360bbb-3(b)(1), unless the authorization is terminated  or revoked sooner.       Influenza A by PCR NEGATIVE NEGATIVE Final   Influenza B by PCR NEGATIVE NEGATIVE Final    Comment: (NOTE) The Xpert Xpress SARS-CoV-2/FLU/RSV plus assay is intended as an aid in the diagnosis of influenza from Nasopharyngeal swab specimens and should not be used as a sole basis for treatment. Nasal washings and aspirates are unacceptable for Xpert Xpress SARS-CoV-2/FLU/RSV testing.  Fact Sheet for Patients: BloggerCourse.com  Fact Sheet for Healthcare Providers: SeriousBroker.it  This test is not yet approved or cleared by the [place] FDA and has been authorized for detection and/or diagnosis of SARS-CoV-2 by FDA under an Emergency Use Authorization (EUA). This EUA will remain in effect (meaning this test can be used) for the duration of the COVID-19 declaration under Section 564(b)(1) of the Act, 21 U.S.C. section 360bbb-3(b)(1), unless the authorization is terminated  or revoked.  Performed at [facility], [address], [place], [place] [address]      Patient was seen and examined on the day of discharge and was found to be in stable condition. Time coordinating discharge: 25 minutes including assessment and coordination of care, as well as examination of the patient.   SIGNED:  Noralee Stain, DO [facility] 11/21/2020, 9:11 AM

## 2020-11-21 NOTE — Progress Notes (Signed)
Patient ID: Paul Gilmore, male   DOB: 1976-09-05, 44 y.o.   MRN: [id number]   Acute Care Surgery Service Progress Note:    Chief Complaint/Subjective: abd pain improving. Primarily taking morphine for pain because he says tramadol doesn't work. abd pain slightly exacerbated by PO intake. Tolerating PO without N/V. +BMs, nonbloody.  Objective: Vital signs in last 24 hours: Temp:  [98.2 F (36.8 C)-98.8 F (37.1 C)] 98.6 F (37 C) (12/27 0539) Pulse Rate:  [69-95] 69 (12/27 0539) Resp:  [16-18] 18 (12/27 0539) BP: (129-142)/(90-94) 142/94 (12/27 0539) SpO2:  [95 %-97 %] 97 % (12/27 0539) Last BM Date: 11/20/20  Intake/Output from previous day: 12/26 0701 - 12/27 0700 In: 289.2 [P.O.:150; IV Piggyback:139.2] Out: -  Intake/Output this shift: No intake/output data recorded.  Lungs:  nonlabored  Cardiovascular: reg  Abd: soft, obese, overall non-tender  Extremities: no edema, +SCDs  Neuro: alert, nonfocal  Lab Results: CBC  Recent Labs    11/20/20 0502 11/21/20 0420  WBC 10.9* 10.5  HGB 16.6 15.8  HCT 48.6 48.1  PLT 366 383   BMET Recent Labs    11/19/20 0506 11/20/20 0502  NA 134* 137  K 3.9 3.9  CL 96* 96*  CO2 26 30  GLUCOSE 96 110*  BUN 12 12  CREATININE 0.83 0.99  CALCIUM 8.6* 8.9   LFT Hepatic Function Latest Ref Rng & Units 11/17/2020 11/16/2020 10/30/2018  Total Protein 6.5 - 8.1 g/dL 6.8 7.6 7.5  Albumin 3.5 - 5.0 g/dL 3.6 4.3 3.9  AST 15 - 41 U/L $Remo'16 16 20  'ZElYz$ ALT 0 - 44 U/L $Remo'14 15 25  'zPsmq$ Alk Phosphatase 38 - 126 U/L 77 91 78  Total Bilirubin 0.3 - 1.2 mg/dL 0.8 1.3(H) 0.5   PT/INR No results for input(s): LABPROT, INR in the last 72 hours. ABG No results for input(s): PHART, HCO3 in the last 72 hours.  Invalid input(s): PCO2, PO2  Studies/Results:  Anti-infectives: Anti-infectives (From admission, onward)   Start     Dose/Rate Route Frequency Ordered Stop   11/17/20 1400  piperacillin-tazobactam (ZOSYN) IVPB 3.375 g        3.375 g 12.5  mL/hr over 4 Hours Intravenous Every 8 hours 11/17/20 0920     11/17/20 1000  cefTRIAXone (ROCEPHIN) 2 g in sodium chloride 0.9 % 100 mL IVPB  Status:  Discontinued        2 g 200 mL/hr over 30 Minutes Intravenous Every 24 hours 11/16/20 1345 11/17/20 0920   11/16/20 1400  metroNIDAZOLE (FLAGYL) tablet 500 mg  Status:  Discontinued       "And" Linked Group Details   500 mg Oral Every 8 hours 11/16/20 1127 11/17/20 0920   11/16/20 1130  cefTRIAXone (ROCEPHIN) 2 g in sodium chloride 0.9 % 100 mL IVPB       "And" Linked Group Details   2 g 200 mL/hr over 30 Minutes Intravenous  Once 11/16/20 1127 11/16/20 1345      Medications: Scheduled Meds: . amLODipine  10 mg Oral Daily  . docusate sodium  100 mg Oral BID  . enoxaparin (LOVENOX) injection  60 mg Subcutaneous Q24H  . hydrochlorothiazide  12.5 mg Oral Daily  . losartan  50 mg Oral Daily   Continuous Infusions: . piperacillin-tazobactam 3.375 g (11/21/20 0543)   PRN Meds:.acetaminophen **OR** acetaminophen, albuterol, labetalol, morphine injection, ondansetron **OR** ondansetron (ZOFRAN) IV, simethicone, traMADol  Assessment/Plan: Patient Active Problem List   Diagnosis Date Noted  . Chronic idiopathic gout  involving toe of left foot without tophus 01/26/2020  . Essential hypertension 10/27/2018  . Diverticulitis 10/09/2018  . Thoracic back pain 04/01/2018  . Diverticulitis of intestine with abscess 03/22/2018  . Obesity, Class III, BMI 40-49.9 (morbid obesity) ([place]) 03/08/2018  . Perforation of sigmoid colon due to diverticulitis 03/03/2018  . Hyperglycemia 03/03/2018  . Right foot pain 06/14/2017  . Great toe pain, left 03/04/2017  . Lower back injury 09/24/2016  . Hand laceration 09/24/2016  . Neck pain 02/08/2014   HTN GERD Obesity - BMI 36.96  Sigmoid diverticulitis without perforation or abscess - CT 12/22 shows colonic wall thickening in sigmoid and distal descending colon - WBC was 15.3 --> 26.9  -->18.6-->13->10.9 --> 10.5, afebrile  - improving. Tolerating PO. Having nonbloody stools. Still using primarily morphine for pain control. I started scheduled PO tylenol because PRN was not being utilized. D/C-ed tramadol and started PRN oxycodone 5 mg. Discussed with the patient that his need for narcotics should be decreasing greatly as he is improving, he should not need narcotic pain medication for more than another day or two.   FEN:  soft diet VTE: SCDs, lovenox ID: IV rocephin/PO flagyl 12/22>12/23; IV Zosyn 12/23>>  Disposition: improving. Tolerating PO. Ok to transtion to PO abx (augmentin) to complete a totally of 14 days. Stable to D/C from surgical perspective. Has f/u scheduled with Dr Dema Severin    LOS: 5 days    Obie Dredge, PA-C General and Trauma Surgery [phone] [facility]

## 2022-04-11 ENCOUNTER — Inpatient Hospital Stay (HOSPITAL_COMMUNITY)
Admission: EM | Admit: 2022-04-11 | Discharge: 2022-04-22 | DRG: 330 | Disposition: A | Discharge status: Home Health | Payer: Commercial Managed Care - PPO | Attending: Family Medicine | Admitting: Family Medicine

## 2022-04-11 ENCOUNTER — Emergency Department (HOSPITAL_COMMUNITY): Payer: Commercial Managed Care - PPO

## 2022-04-11 DIAGNOSIS — Z6836 Body mass index (BMI) 36.0-36.9, adult: Secondary | ICD-10-CM | POA: Diagnosis not present

## 2022-04-11 DIAGNOSIS — R1032 Left lower quadrant pain: Principal | ICD-10-CM

## 2022-04-11 DIAGNOSIS — K529 Noninfective gastroenteritis and colitis, unspecified: Secondary | ICD-10-CM | POA: Diagnosis present

## 2022-04-11 DIAGNOSIS — K5792 Diverticulitis of intestine, part unspecified, without perforation or abscess without bleeding: Secondary | ICD-10-CM | POA: Diagnosis present

## 2022-04-11 DIAGNOSIS — K5939 Other megacolon: Secondary | ICD-10-CM | POA: Diagnosis present

## 2022-04-11 DIAGNOSIS — I10 Essential (primary) hypertension: Secondary | ICD-10-CM | POA: Diagnosis present

## 2022-04-11 DIAGNOSIS — I160 Hypertensive urgency: Secondary | ICD-10-CM | POA: Diagnosis present

## 2022-04-11 DIAGNOSIS — K5732 Diverticulitis of large intestine without perforation or abscess without bleeding: Secondary | ICD-10-CM | POA: Diagnosis present

## 2022-04-11 DIAGNOSIS — M1A0720 Idiopathic chronic gout, left ankle and foot, without tophus (tophi): Secondary | ICD-10-CM | POA: Diagnosis present

## 2022-04-11 DIAGNOSIS — K56600 Partial intestinal obstruction, unspecified as to cause: Secondary | ICD-10-CM

## 2022-04-11 DIAGNOSIS — Z8249 Family history of ischemic heart disease and other diseases of the circulatory system: Secondary | ICD-10-CM

## 2022-04-11 DIAGNOSIS — E669 Obesity, unspecified: Secondary | ICD-10-CM | POA: Diagnosis present

## 2022-04-11 DIAGNOSIS — Z20822 Contact with and (suspected) exposure to covid-19: Secondary | ICD-10-CM | POA: Diagnosis present

## 2022-04-11 DIAGNOSIS — K219 Gastro-esophageal reflux disease without esophagitis: Secondary | ICD-10-CM | POA: Diagnosis present

## 2022-04-11 DIAGNOSIS — F419 Anxiety disorder, unspecified: Secondary | ICD-10-CM | POA: Diagnosis present

## 2022-04-11 DIAGNOSIS — M1990 Unspecified osteoarthritis, unspecified site: Secondary | ICD-10-CM | POA: Diagnosis not present

## 2022-04-11 DIAGNOSIS — Z79899 Other long term (current) drug therapy: Secondary | ICD-10-CM | POA: Diagnosis not present

## 2022-04-11 DIAGNOSIS — K565 Intestinal adhesions [bands], unspecified as to partial versus complete obstruction: Secondary | ICD-10-CM | POA: Diagnosis present

## 2022-04-11 DIAGNOSIS — R001 Bradycardia, unspecified: Secondary | ICD-10-CM | POA: Diagnosis not present

## 2022-04-11 DIAGNOSIS — K56609 Unspecified intestinal obstruction, unspecified as to partial versus complete obstruction: Secondary | ICD-10-CM | POA: Diagnosis not present

## 2022-04-11 LAB — CBC WITH DIFFERENTIAL/PLATELET
Abs Immature Granulocytes: 0.05 10*3/uL (ref 0.00–0.07)
Basophils Absolute: 0 10*3/uL (ref 0.0–0.1)
Basophils Relative: 0 %
Eosinophils Absolute: 0 10*3/uL (ref 0.0–0.5)
Eosinophils Relative: 0 %
HCT: 50.1 % (ref 39.0–52.0)
Hemoglobin: 18.2 g/dL — ABNORMAL HIGH (ref 13.0–17.0)
Immature Granulocytes: 0 %
Lymphocytes Relative: 15 %
Lymphs Abs: 1.7 10*3/uL (ref 0.7–4.0)
MCH: 31.8 pg (ref 26.0–34.0)
MCHC: 36.3 g/dL — ABNORMAL HIGH (ref 30.0–36.0)
MCV: 87.6 fL (ref 80.0–100.0)
Monocytes Absolute: 0.7 10*3/uL (ref 0.1–1.0)
Monocytes Relative: 6 %
Neutro Abs: 9.3 10*3/uL — ABNORMAL HIGH (ref 1.7–7.7)
Neutrophils Relative %: 79 %
Platelets: 340 10*3/uL (ref 150–400)
RBC: 5.72 MIL/uL (ref 4.22–5.81)
RDW: 12.1 % (ref 11.5–15.5)
WBC: 11.8 10*3/uL — ABNORMAL HIGH (ref 4.0–10.5)
nRBC: 0 % (ref 0.0–0.2)

## 2022-04-11 LAB — URINALYSIS, ROUTINE W REFLEX MICROSCOPIC
Bacteria, UA: NONE SEEN
Bilirubin Urine: NEGATIVE
Glucose, UA: NEGATIVE mg/dL
Hgb urine dipstick: NEGATIVE
Ketones, ur: 80 mg/dL — AB
Leukocytes,Ua: NEGATIVE
Nitrite: NEGATIVE
Protein, ur: 100 mg/dL — AB
Specific Gravity, Urine: >1.046 — ABNORMAL HIGH (ref 1.005–1.030)
pH: 5 (ref 5.0–8.0)

## 2022-04-11 LAB — RESP PANEL BY RT-PCR (FLU A&B, COVID) ARPGX2
Influenza A by PCR: NEGATIVE
Influenza B by PCR: NEGATIVE
SARS Coronavirus 2 by RT PCR: NEGATIVE

## 2022-04-11 LAB — COMPREHENSIVE METABOLIC PANEL
ALT: 16 U/L (ref 0–44)
AST: 17 U/L (ref 15–41)
Albumin: 4.2 g/dL (ref 3.5–5.0)
Alkaline Phosphatase: 81 U/L (ref 38–126)
Anion gap: 12 (ref 5–15)
BUN: 17 mg/dL (ref 6–20)
CO2: 20 mmol/L — ABNORMAL LOW (ref 22–32)
Calcium: 9.1 mg/dL (ref 8.9–10.3)
Chloride: 102 mmol/L (ref 98–111)
Creatinine, Ser: 0.9 mg/dL (ref 0.61–1.24)
GFR, Estimated: >60 mL/min (ref >60)
Glucose, Bld: 98 mg/dL (ref 70–99)
Potassium: 3.9 mmol/L (ref 3.5–5.1)
Sodium: 134 mmol/L — ABNORMAL LOW (ref 135–145)
Total Bilirubin: 1.3 mg/dL — ABNORMAL HIGH (ref 0.3–1.2)
Total Protein: 7.8 g/dL (ref 6.5–8.1)

## 2022-04-11 LAB — LIPASE, BLOOD: Lipase: 23 U/L (ref 11–51)

## 2022-04-11 MED ORDER — IOHEXOL 300 MG/ML  SOLN
100.0000 mL | Freq: Once | INTRAMUSCULAR | Status: AC | PRN
  Administered 2022-04-11: 100 mL via INTRAVENOUS

## 2022-04-11 MED ORDER — ONDANSETRON 4 MG PO TBDP
4.0000 mg | ORAL_TABLET | Freq: Once | ORAL | Status: AC
  Administered 2022-04-11: 4 mg via ORAL
  Filled 2022-04-11: qty 1

## 2022-04-11 MED ORDER — HYDROCODONE-ACETAMINOPHEN 5-325 MG PO TABS
1.0000 | ORAL_TABLET | Freq: Once | ORAL | Status: AC
  Administered 2022-04-11: 1 via ORAL
  Filled 2022-04-11: qty 1

## 2022-04-11 MED ORDER — LABETALOL HCL 5 MG/ML IV SOLN
10.0000 mg | INTRAVENOUS | Status: DC | PRN
  Administered 2022-04-11 – 2022-04-14 (×8): 10 mg via INTRAVENOUS
  Filled 2022-04-11 (×10): qty 4

## 2022-04-11 MED ORDER — LACTATED RINGERS IV SOLN
INTRAVENOUS | Status: AC
Start: 2022-04-11 — End: 2022-04-12

## 2022-04-11 MED ORDER — SODIUM CHLORIDE 0.9 % IV BOLUS
1000.0000 mL | Freq: Once | INTRAVENOUS | Status: AC
  Administered 2022-04-11: 1000 mL via INTRAVENOUS

## 2022-04-11 MED ORDER — ONDANSETRON HCL 4 MG/2ML IJ SOLN
4.0000 mg | Freq: Four times a day (QID) | INTRAMUSCULAR | Status: DC | PRN
Start: 2022-04-11 — End: 2022-04-14
  Administered 2022-04-12 – 2022-04-13 (×4): 4 mg via INTRAVENOUS
  Filled 2022-04-11 (×4): qty 2

## 2022-04-11 MED ORDER — METRONIDAZOLE 500 MG/100ML IV SOLN
500.0000 mg | Freq: Two times a day (BID) | INTRAVENOUS | Status: DC
Start: 2022-04-11 — End: 2022-04-13
  Administered 2022-04-11 – 2022-04-12 (×3): 500 mg via INTRAVENOUS
  Filled 2022-04-11 (×3): qty 100

## 2022-04-11 MED ORDER — HYDROMORPHONE HCL 1 MG/ML IJ SOLN
0.5000 mg | INTRAMUSCULAR | Status: DC | PRN
Start: 2022-04-11 — End: 2022-04-22
  Administered 2022-04-11 – 2022-04-21 (×66): 1 mg via INTRAVENOUS
  Filled 2022-04-11 (×69): qty 1

## 2022-04-11 MED ORDER — SODIUM CHLORIDE 0.9 % IV SOLN
2.0000 g | INTRAVENOUS | Status: DC
  Administered 2022-04-12 (×2): 2 g via INTRAVENOUS
  Filled 2022-04-11 (×2): qty 20

## 2022-04-11 MED ORDER — FENTANYL CITRATE PF 50 MCG/ML IJ SOSY
50.0000 ug | PREFILLED_SYRINGE | Freq: Once | INTRAMUSCULAR | Status: AC
  Administered 2022-04-11: 50 ug via INTRAVENOUS
  Filled 2022-04-11: qty 1

## 2022-04-11 NOTE — ED Provider Notes (Signed)
?[name]-EMERGENCY DEPT ?Provider Note ? ? ?CSN: [id number] ?Arrival date & time: 04/11/22  1402 ? ?  ? ?History ? ?Chief Complaint  ?Patient presents with  ? Abdominal Pain  ? ? ?Paul Gilmore is a 46 y.o. male. ? ?46 year old male complaining of LLQ pain onset on Monday. History of diverticulitis. He attempted liquids only since onset of pain, no relief. Denies fever, chills. Nausea, with one episode of emesis on Monday. ? ?The history is provided by the patient and medical records. No language interpreter was used.  ?Abdominal Pain ?Pain location:  LLQ ?Pain quality: bloating and gnawing   ?Pain radiates to:  Does not radiate ?Pain severity:  Severe ?Onset quality:  Gradual ?Duration:  4 days ?Timing:  Constant ?Progression:  Worsening ?Ineffective treatments:  Liquids ?Associated symptoms: no fever   ? ?  ? ?Home Medications ?Prior to Admission medications   ?Medication Sig Start Date End Date Taking? Authorizing Provider  ?acetaminophen (TYLENOL) 500 MG tablet Take 1,000 mg by mouth every 8 (eight) hours as needed for moderate pain.    [provider]  ?albuterol (PROVENTIL HFA;VENTOLIN HFA) 108 (90 Base) MCG/ACT inhaler Inhale 1 puff into the lungs every 6 (six) hours as needed for wheezing or shortness of breath.    [provider]  ?amLODipine (NORVASC) 10 MG tablet Take 1 tablet (10 mg total) by mouth daily. 11/21/20 02/19/21  Noralee Stain, DO  ?hydrochlorothiazide (MICROZIDE) 12.5 MG capsule Take 1 capsule (12.5 mg total) by mouth daily. 11/21/20   Noralee Stain, DO  ?losartan (COZAAR) 50 MG tablet Take 1 tablet (50 mg total) by mouth daily. 11/21/20   Noralee Stain, DO  ?   ? ?Allergies    ?Patient has no known allergies.   ? ?Review of Systems   ?Review of Systems  ?Constitutional:  Positive for appetite change. Negative for fever.  ?Gastrointestinal:  Positive for abdominal pain.  ?All other systems reviewed and are negative. ? ?Physical Exam ?Updated Vital  Signs ?BP (!) 171/106   Pulse 85   Temp 98.7 ?F (37.1 ?C) (Oral)   Resp 18   Ht 5\' 11"  (1.803 m)   Wt 117.9 kg   SpO2 96%   BMI 36.26 kg/m?  ?Physical Exam ?Constitutional:   ?   Appearance: He is well-developed.  ?HENT:  ?   Head: Normocephalic.  ?   Nose: Nose normal.  ?   Mouth/Throat:  ?   Mouth: Mucous membranes are moist.  ?Eyes:  ?   General: No scleral icterus. ?   Conjunctiva/sclera: Conjunctivae normal.  ?Cardiovascular:  ?   Rate and Rhythm: Normal rate and regular rhythm.  ?Pulmonary:  ?   Effort: Pulmonary effort is normal.  ?   Breath sounds: Normal breath sounds.  ?Abdominal:  ?   Tenderness: There is abdominal tenderness in the left lower quadrant.  ?Musculoskeletal:     ?   General: Normal range of motion.  ?   Cervical back: Normal range of motion.  ?Skin: ?   General: Skin is warm and dry.  ?Neurological:  ?   Mental Status: He is alert and oriented to person, place, and time.  ?Psychiatric:     ?   Mood and Affect: Mood normal.     ?   Behavior: Behavior normal.  ? ? ?ED Results / Procedures / Treatments   ?Labs ?(all labs ordered are listed, but only abnormal results are displayed) ?Labs Reviewed  ?CBC WITH  DIFFERENTIAL/PLATELET - Abnormal; Notable for the following components:  ?    Result Value  ? WBC 11.8 (*)   ? Hemoglobin 18.2 (*)   ? MCHC 36.3 (*)   ? Neutro Abs 9.3 (*)   ? All other components within normal limits  ?COMPREHENSIVE METABOLIC PANEL - Abnormal; Notable for the following components:  ? Sodium 134 (*)   ? CO2 20 (*)   ? Total Bilirubin 1.3 (*)   ? All other components within normal limits  ?URINALYSIS, ROUTINE W REFLEX MICROSCOPIC - Abnormal; Notable for the following components:  ? Color, Urine AMBER (*)   ? Specific Gravity, Urine >1.046 (*)   ? Ketones, ur 80 (*)   ? Protein, ur 100 (*)   ? All other components within normal limits  ?LIPASE, BLOOD  ? ? ?EKG ?None ? ?Radiology ?CT Abdomen Pelvis W Contrast ? ?Result Date: 04/11/2022 ?CLINICAL DATA:  Left lower quadrant  abdominal pain. EXAM: CT ABDOMEN AND PELVIS WITH CONTRAST TECHNIQUE: Multidetector CT imaging of the abdomen and pelvis was performed using the standard protocol following bolus administration of intravenous contrast. RADIATION DOSE REDUCTION: This exam was performed according to the departmental dose-optimization program which includes automated exposure control, adjustment of the mA and/or kV according to patient size and/or use of iterative reconstruction technique. CONTRAST:  OMNIPAQUE IOHEXOL 300 MG/ML  SOLN COMPARISON:  CT abdomen and pelvis 11/16/2020. FINDINGS: Lower chest: No acute abnormality. Hepatobiliary: No focal liver abnormality is seen. No gallstones, gallbladder wall thickening, or biliary dilatation. Pancreas: Unremarkable. No pancreatic ductal dilatation or surrounding inflammatory changes. Spleen: Normal in size without focal abnormality. Adrenals/Urinary Tract: Adrenal glands are unremarkable. Kidneys are normal, without renal calculi, focal lesion, or hydronephrosis. Bladder is unremarkable. Stomach/Bowel: The colon is diffusely distended with transition point at the level of the sigmoid colon. There is questionable mid sigmoid colon wall thickening. There are small surrounding lymph nodes in this region. There is some mild wall thickening of the splenic flexure of the colon with mild surrounding inflammation. No pneumatosis or free air. Appendix, small bowel and stomach are within normal limits. Vascular/Lymphatic: No significant vascular findings are present. No enlarged abdominal or pelvic lymph nodes. Reproductive: Prostate is unremarkable. Other: Small fat containing umbilical hernia.  No ascites. Musculoskeletal: There is stable mild chronic compression fracture of T12. IMPRESSION: 1. There is diffuse colonic distension with transition point in the mid sigmoid colon where there is sigmoid colon wall thickening with surrounding small lymph nodes. Can not exclude stricture or mass of  the mid sigmoid colon with developing colonic obstruction. 2. Additional wall thickening and inflammation of the splenic flexure of the colon worrisome for nonspecific colitis. Electronically Signed   By: Darliss Cheney M.D.   On: 04/11/2022 18:17   ? ?Procedures ?Procedures  ? ? ?Medications Ordered in ED ?Medications  ?HYDROcodone-acetaminophen (NORCO/VICODIN) 5-325 MG per tablet 1 tablet (1 tablet Oral Given 04/11/22 1544)  ?ondansetron (ZOFRAN-ODT) disintegrating tablet 4 mg (4 mg Oral Given 04/11/22 1545)  ?iohexol (OMNIPAQUE) 300 MG/ML solution 100 mL (100 mLs Intravenous Contrast Given 04/11/22 1801)  ? ? ?ED Course/ Medical Decision Making/ A&P ? Ct concerning for mass or stricture of the sigmoid colon with possible developing obstruction. Surgical consult requested. Spoke with Dr. Franco Collet to medicine with GI involvement. Surgery will follow in consultation. ? ?Spoke with hospitalist (Opyd) to admit patient.  ?                        ?  Medical Decision Making ? ? ? ? ? ? ? ? ? ?Final Clinical Impression(s) / ED Diagnoses ?Final diagnoses:  ?Abdominal pain, LLQ  ? ? ?Rx / DC Orders ?ED Discharge Orders   ? ? None  ? ?  ? ? ?  ?Felicie MornSmith, Jakin Pavao, NP ?04/11/22 2135 ? ?  ?Alvira MondaySchlossman, Erin, MD ?04/11/22 2316 ? ?

## 2022-04-11 NOTE — ED Triage Notes (Signed)
Pt states he is having LLQ pain for the past 2 days. Pt reports nausea 2 nights ago. ?

## 2022-04-11 NOTE — Consult Note (Signed)
? ?CC: LLQ abdominal pain ?Referring: Dr. Marcial Pacas Opyd ? ?HPI: ?Paul Gilmore is an 46 y.o. male who has a history of multiple attacks of diverticulitis who presents with left lower quadrant abdominal pain.  He reports that the crampy discomfort started this weekend.  He reverted to a liquid diet but the pain persisted.  He had 1 episode of emesis.  He last moves his bowels on Monday.  He is still passing flatus.  His last attack of diverticulitis was in 2021.  He was post to follow-up with our office in January 2022 but did not.  He reports he has had no bowel issues, constipation, or change in stool habits until this past weekend.  His last colonoscopy was in 2019.  He denies fevers or chills.  He has no cardiopulmonary issues ? ?Past Medical History:  ?Diagnosis Date  ? Allergy   ? Anxiety   ? Bowel obstruction (HCC)   ? Diverticulitis of large intestine with perforation and abscess 02/2018  ? GERD (gastroesophageal reflux disease)   ? HTN (hypertension)   ? ? ?Past Surgical History:  ?Procedure Laterality Date  ? NO PAST SURGERIES    ? ? ?Family History  ?Problem Relation Age of Onset  ? COPD Mother   ? Diverticulitis Mother   ? Alcohol abuse Father   ? Hypertension Father   ? Throat cancer Father   ? Esophageal cancer Father   ? Colon cancer Neg Hx   ? Rectal cancer Neg Hx   ? Stomach cancer Neg Hx   ? ? ?Social:  reports that he has never smoked. He quit smokeless tobacco use about 7 years ago.  His smokeless tobacco use included chew. He reports current alcohol use. He reports current drug use. Drug: Marijuana. ? ?Allergies: No Known Allergies ? ?Medications: I have reviewed the patient's current medications. ? ?Results for orders placed or performed during the hospital encounter of 04/11/22 (from the past 48 hour(s))  ?CBC with Differential     Status: Abnormal  ? Collection Time: 04/11/22  3:47 PM  ?Result Value Ref Range  ? WBC 11.8 (H) 4.0 - 10.5 K/uL  ? RBC 5.72 4.22 - 5.81 MIL/uL  ? Hemoglobin 18.2 (H)  13.0 - 17.0 g/dL  ? HCT 50.1 39.0 - 52.0 %  ? MCV 87.6 80.0 - 100.0 fL  ? MCH 31.8 26.0 - 34.0 pg  ? MCHC 36.3 (H) 30.0 - 36.0 g/dL  ? RDW 12.1 11.5 - 15.5 %  ? Platelets 340 150 - 400 K/uL  ? nRBC 0.0 0.0 - 0.2 %  ? Neutrophils Relative % 79 %  ? Neutro Abs 9.3 (H) 1.7 - 7.7 K/uL  ? Lymphocytes Relative 15 %  ? Lymphs Abs 1.7 0.7 - 4.0 K/uL  ? Monocytes Relative 6 %  ? Monocytes Absolute 0.7 0.1 - 1.0 K/uL  ? Eosinophils Relative 0 %  ? Eosinophils Absolute 0.0 0.0 - 0.5 K/uL  ? Basophils Relative 0 %  ? Basophils Absolute 0.0 0.0 - 0.1 K/uL  ? Immature Granulocytes 0 %  ? Abs Immature Granulocytes 0.05 0.00 - 0.07 K/uL  ?  Comment: Performed at [facility], [address] [place], [place] [address]  ?Comprehensive metabolic panel     Status: Abnormal  ? Collection Time: 04/11/22  3:47 PM  ?Result Value Ref Range  ? Sodium 134 (L) 135 - 145 mmol/L  ? Potassium 3.9 3.5 - 5.1 mmol/L  ? Chloride 102 98 -  111 mmol/L  ? CO2 20 (L) 22 - 32 mmol/L  ? Glucose, Bld 98 70 - 99 mg/dL  ?  Comment: Glucose reference range applies only to samples taken after fasting for at least 8 hours.  ? BUN 17 6 - 20 mg/dL  ? Creatinine, Ser 0.90 0.61 - 1.24 mg/dL  ? Calcium 9.1 8.9 - 10.3 mg/dL  ? Total Protein 7.8 6.5 - 8.1 g/dL  ? Albumin 4.2 3.5 - 5.0 g/dL  ? AST 17 15 - 41 U/L  ? ALT 16 0 - 44 U/L  ? Alkaline Phosphatase 81 38 - 126 U/L  ? Total Bilirubin 1.3 (H) 0.3 - 1.2 mg/dL  ? GFR, Estimated >60 >60 mL/min  ?  Comment: (NOTE) ?Calculated using the CKD-EPI Creatinine Equation (2021) ?  ? Anion gap 12 5 - 15  ?  Comment: Performed at [facility], [address], [place], [place] [address]  ?Lipase, blood     Status: None  ? Collection Time: 04/11/22  3:47 PM  ?Result Value Ref Range  ? Lipase 23 11 - 51 U/L  ?  Comment: Performed at [facility], [address], [place], [place] [address]  ?Urinalysis, Routine w reflex microscopic Urine, Clean Catch     Status: Abnormal  ?  Collection Time: 04/11/22  6:12 PM  ?Result Value Ref Range  ? Color, Urine AMBER (A) YELLOW  ?  Comment: BIOCHEMICALS MAY BE AFFECTED BY COLOR  ? APPearance CLEAR CLEAR  ? Specific Gravity, Urine >1.046 (H) 1.005 - 1.030  ? pH 5.0 5.0 - 8.0  ? Glucose, UA NEGATIVE NEGATIVE mg/dL  ? Hgb urine dipstick NEGATIVE NEGATIVE  ? Bilirubin Urine NEGATIVE NEGATIVE  ? Ketones, ur 80 (A) NEGATIVE mg/dL  ? Protein, ur 100 (A) NEGATIVE mg/dL  ? Nitrite NEGATIVE NEGATIVE  ? Leukocytes,Ua NEGATIVE NEGATIVE  ? RBC / HPF 0-5 0 - 5 RBC/hpf  ? WBC, UA 0-5 0 - 5 WBC/hpf  ? Bacteria, UA NONE SEEN NONE SEEN  ? Squamous Epithelial / LPF 0-5 0 - 5  ? Mucus PRESENT   ?  Comment: Performed at [facility], [address] [place], [place] [address]  ? ? ?CT Abdomen Pelvis W Contrast ? ?Result Date: 04/11/2022 ?CLINICAL DATA:  Left lower quadrant abdominal pain. EXAM: CT ABDOMEN AND PELVIS WITH CONTRAST TECHNIQUE: Multidetector CT imaging of the abdomen and pelvis was performed using the standard protocol following bolus administration of intravenous contrast. RADIATION DOSE REDUCTION: This exam was performed according to the departmental dose-optimization program which includes automated exposure control, adjustment of the mA and/or kV according to patient size and/or use of iterative reconstruction technique. CONTRAST:  OMNIPAQUE IOHEXOL 300 MG/ML  SOLN COMPARISON:  CT abdomen and pelvis 11/16/2020. FINDINGS: Lower chest: No acute abnormality. Hepatobiliary: No focal liver abnormality is seen. No gallstones, gallbladder wall thickening, or biliary dilatation. Pancreas: Unremarkable. No pancreatic ductal dilatation or surrounding inflammatory changes. Spleen: Normal in size without focal abnormality. Adrenals/Urinary Tract: Adrenal glands are unremarkable. Kidneys are normal, without renal calculi, focal lesion, or hydronephrosis. Bladder is unremarkable. Stomach/Bowel: The colon is diffusely distended with transition  point at the level of the sigmoid colon. There is questionable mid sigmoid colon wall thickening. There are small surrounding lymph nodes in this region. There is some mild wall thickening of the splenic flexure of the colon with mild surrounding inflammation. No pneumatosis or free air. Appendix, small bowel and stomach are within normal  limits. Vascular/Lymphatic: No significant vascular findings are present. No enlarged abdominal or pelvic lymph nodes. Reproductive: Prostate is unremarkable. Other: Small fat containing umbilical hernia.  No ascites. Musculoskeletal: There is stable mild chronic compression fracture of T12. IMPRESSION: 1. There is diffuse colonic distension with transition point in the mid sigmoid colon where there is sigmoid colon wall thickening with surrounding small lymph nodes. Can not exclude stricture or mass of the mid sigmoid colon with developing colonic obstruction. 2. Additional wall thickening and inflammation of the splenic flexure of the colon worrisome for nonspecific colitis. Electronically Signed   By: Darliss CheneyAmy  Guttmann M.D.   On: 04/11/2022 18:17   ? ?ROS - all of the below systems have been reviewed with the patient and positives are indicated with bold text ?General: chills, fever or night sweats ?Eyes: blurry vision or double vision ?ENT: epistaxis or sore throat ?Allergy/Immunology: itchy/watery eyes or nasal congestion ?Hematologic/Lymphatic: bleeding problems, blood clots or swollen lymph nodes ?Endocrine: temperature intolerance or unexpected weight changes ?Breast: new or changing breast lumps or nipple discharge ?Resp: cough, shortness of breath, or wheezing ?CV: chest pain or dyspnea on exertion ?GI: as per HPI ?GU: dysuria, trouble voiding, or hematuria ?MSK: joint pain or joint stiffness ?Neuro: TIA or stroke symptoms ?Derm: pruritus and skin lesion changes ?Psych: anxiety and depression ? ?PE ?Blood pressure (!) 168/127, pulse 88, temperature 98.7 ?F (37.1 ?C),  temperature source Oral, resp. rate 18, height 5\' 11"  (1.803 m), weight 117.9 kg, SpO2 96 %. ?Constitutional: NAD; conversant; no deformities ?Eyes: Moist conjunctiva; no lid lag; anicteric; PERRL ?Neck: Tra

## 2022-04-11 NOTE — ED Provider Triage Note (Signed)
Emergency Medicine Provider Triage Evaluation Note ? ?Paul Gilmore , a 46 y.o. male  was evaluated in triage.  Pt complains of llq pain on Monday. Hx of diverticulitis with perf without surgery. Tried liquids at home without relief. Some constipation. No fever. Pain 10/10. ? ?Sees [facility] ? ?Review of Systems  ?Positive: Llq pain ?Negative: Fever, emesis ? ?Physical Exam  ?BP (!) 177/123 (BP Location: Left Arm)   Pulse 89   Temp 98.7 ?F (37.1 ?C) (Oral)   Resp 18   Ht 5\' 11"  (1.803 m)   Wt 117.9 kg   SpO2 98%   BMI 36.26 kg/m?  ?Gen:   Awake, no distress   ?Resp:  Normal effort  ?MSK:   Moves extremities without difficulty  ?ABD:  Tenderness to LLQ ?Other:   ? ?Medical Decision Making  ?Medically screening exam initiated at 3:19 PM.  Appropriate orders placed.  Paul Gilmore was informed that the remainder of the evaluation will be completed by another provider, this initial triage assessment does not replace that evaluation, and the importance of remaining in the ED until their evaluation is complete. ? ?Abd pain ?  ?Zamya Culhane A, PA-C ?04/11/22 1520 ? ?

## 2022-04-12 ENCOUNTER — Other Ambulatory Visit: Payer: Self-pay

## 2022-04-12 ENCOUNTER — Encounter (HOSPITAL_COMMUNITY): Payer: Self-pay | Admitting: Family Medicine

## 2022-04-12 DIAGNOSIS — K5792 Diverticulitis of intestine, part unspecified, without perforation or abscess without bleeding: Secondary | ICD-10-CM | POA: Diagnosis not present

## 2022-04-12 DIAGNOSIS — R1032 Left lower quadrant pain: Secondary | ICD-10-CM

## 2022-04-12 DIAGNOSIS — I160 Hypertensive urgency: Secondary | ICD-10-CM | POA: Diagnosis present

## 2022-04-12 LAB — URINALYSIS, ROUTINE W REFLEX MICROSCOPIC
Bacteria, UA: NONE SEEN
Bilirubin Urine: NEGATIVE
Glucose, UA: NEGATIVE mg/dL
Hgb urine dipstick: NEGATIVE
Ketones, ur: 20 mg/dL — AB
Leukocytes,Ua: NEGATIVE
Nitrite: NEGATIVE
Protein, ur: 30 mg/dL — AB
Specific Gravity, Urine: 1.034 — ABNORMAL HIGH (ref 1.005–1.030)
pH: 5 (ref 5.0–8.0)

## 2022-04-12 LAB — HIV ANTIBODY (ROUTINE TESTING W REFLEX): HIV Screen 4th Generation wRfx: NONREACTIVE

## 2022-04-12 LAB — BASIC METABOLIC PANEL
Anion gap: 10 (ref 5–15)
BUN: 15 mg/dL (ref 6–20)
CO2: 22 mmol/L (ref 22–32)
Calcium: 8.5 mg/dL — ABNORMAL LOW (ref 8.9–10.3)
Chloride: 104 mmol/L (ref 98–111)
Creatinine, Ser: 0.89 mg/dL (ref 0.61–1.24)
GFR, Estimated: >60 mL/min (ref >60)
Glucose, Bld: 102 mg/dL — ABNORMAL HIGH (ref 70–99)
Potassium: 4.2 mmol/L (ref 3.5–5.1)
Sodium: 136 mmol/L (ref 135–145)

## 2022-04-12 LAB — HEPATIC FUNCTION PANEL
ALT: 15 U/L (ref 0–44)
AST: 15 U/L (ref 15–41)
Albumin: 3.6 g/dL (ref 3.5–5.0)
Alkaline Phosphatase: 68 U/L (ref 38–126)
Bilirubin, Direct: 0.2 mg/dL (ref 0.0–0.2)
Indirect Bilirubin: 0.7 mg/dL (ref 0.3–0.9)
Total Bilirubin: 0.9 mg/dL (ref 0.3–1.2)
Total Protein: 6.7 g/dL (ref 6.5–8.1)

## 2022-04-12 LAB — CBC
HCT: 48.3 % (ref 39.0–52.0)
Hemoglobin: 16.8 g/dL (ref 13.0–17.0)
MCH: 30.9 pg (ref 26.0–34.0)
MCHC: 34.8 g/dL (ref 30.0–36.0)
MCV: 89 fL (ref 80.0–100.0)
Platelets: 308 10*3/uL (ref 150–400)
RBC: 5.43 MIL/uL (ref 4.22–5.81)
RDW: 12 % (ref 11.5–15.5)
WBC: 12.6 10*3/uL — ABNORMAL HIGH (ref 4.0–10.5)
nRBC: 0 % (ref 0.0–0.2)

## 2022-04-12 MED ORDER — DOCUSATE SODIUM 100 MG PO CAPS
100.0000 mg | ORAL_CAPSULE | Freq: Two times a day (BID) | ORAL | Status: DC
  Administered 2022-04-12 – 2022-04-14 (×4): 100 mg via ORAL
  Filled 2022-04-12 (×4): qty 1

## 2022-04-12 MED ORDER — METHOCARBAMOL 1000 MG/10ML IJ SOLN
1000.0000 mg | Freq: Four times a day (QID) | INTRAVENOUS | Status: DC | PRN
  Administered 2022-04-14 – 2022-04-18 (×12): 1000 mg via INTRAVENOUS
  Filled 2022-04-12 (×2): qty 10
  Filled 2022-04-12 (×11): qty 1000

## 2022-04-12 MED ORDER — KETOROLAC TROMETHAMINE 30 MG/ML IJ SOLN
30.0000 mg | Freq: Four times a day (QID) | INTRAMUSCULAR | Status: AC | PRN
  Administered 2022-04-12 (×2): 30 mg via INTRAVENOUS
  Filled 2022-04-12 (×3): qty 1

## 2022-04-12 MED ORDER — ENALAPRILAT 1.25 MG/ML IV SOLN
0.6250 mg | Freq: Four times a day (QID) | INTRAVENOUS | Status: DC
Start: 2022-04-12 — End: 2022-04-13
  Administered 2022-04-12 – 2022-04-13 (×4): 0.625 mg via INTRAVENOUS
  Filled 2022-04-12 (×4): qty 0.5

## 2022-04-12 MED ORDER — SIMETHICONE 80 MG PO CHEW
80.0000 mg | CHEWABLE_TABLET | Freq: Four times a day (QID) | ORAL | Status: DC | PRN
  Administered 2022-04-12 – 2022-04-17 (×8): 80 mg via ORAL
  Filled 2022-04-12 (×8): qty 1

## 2022-04-12 MED ORDER — HYDRALAZINE HCL 20 MG/ML IJ SOLN
10.0000 mg | INTRAMUSCULAR | Status: DC | PRN
  Administered 2022-04-13 – 2022-04-18 (×5): 10 mg via INTRAVENOUS
  Filled 2022-04-12 (×6): qty 1

## 2022-04-12 MED ORDER — ALBUTEROL SULFATE (2.5 MG/3ML) 0.083% IN NEBU: 3.0000 mL | INHALATION_SOLUTION | Freq: Four times a day (QID) | RESPIRATORY_TRACT | Status: DC | PRN

## 2022-04-12 MED ORDER — ENOXAPARIN SODIUM 40 MG/0.4ML IJ SOSY
40.0000 mg | PREFILLED_SYRINGE | INTRAMUSCULAR | Status: DC
  Administered 2022-04-12 – 2022-04-16 (×5): 40 mg via SUBCUTANEOUS
  Filled 2022-04-12 (×5): qty 0.4

## 2022-04-12 NOTE — Progress Notes (Signed)
MD notified of elevated BP's, orders noted

## 2022-04-12 NOTE — Progress Notes (Addendum)
[facility] Progress Note     Subjective: CC:  He is unsure if he is better, worse, or the same as yesterday. Reports bloating and lower abdominal discomfort. Denies flatus. Last reported BM was Monday 5/15. Also c/o mild nausea. Without vomiting.   Objective: Vital signs in last 24 hours: Temp:  [97.8 F (36.6 C)-98.7 F (37.1 C)] 98.4 F (36.9 C) (05/18 0404) Pulse Rate:  [73-91] 78 (05/18 0404) Resp:  [18] 18 (05/18 0040) BP: (168-189)/(106-127) 181/120 (05/18 0404) SpO2:  [95 %-98 %] 96 % (05/18 0404) Weight:  [117.9 kg] 117.9 kg (05/17 1513) Last BM Date : 04/09/22  Intake/Output from previous day: 05/17 0701 - 05/18 0700 In: 2208.9 [P.O.:30; I.V.:978.9; IV Piggyback:1200] Out: 250 [Urine:250] Intake/Output this shift: No intake/output data recorded.  PE: Gen:  Alert, NAD, pleasant Card:  Regular rate and rhythm Pulm:  Normal effort ORA Abd: Soft, obese, mild distention, diffuse abdominal tenderness, worse in LLQ and suprapubic regions without guarding.  Skin: warm and dry, no rashes  Psych: A&Ox3   Lab Results:  Recent Labs    04/11/22 1547 04/12/22 0446  WBC 11.8* 12.6*  HGB 18.2* 16.8  HCT 50.1 48.3  PLT 340 308   BMET Recent Labs    04/11/22 1547 04/12/22 0446  NA 134* 136  K 3.9 4.2  CL 102 104  CO2 20* 22  GLUCOSE 98 102*  BUN 17 15  CREATININE 0.90 0.89  CALCIUM 9.1 8.5*   PT/INR No results for input(s): LABPROT, INR in the last 72 hours. CMP     Component Value Date/Time   NA 136 04/12/2022 0446   K 4.2 04/12/2022 0446   CL 104 04/12/2022 0446   CO2 22 04/12/2022 0446   GLUCOSE 102 (H) 04/12/2022 0446   BUN 15 04/12/2022 0446   CREATININE 0.89 04/12/2022 0446   CALCIUM 8.5 (L) 04/12/2022 0446   PROT 6.7 04/12/2022 0446   ALBUMIN 3.6 04/12/2022 0446   AST 15 04/12/2022 0446   ALT 15 04/12/2022 0446   ALKPHOS 68 04/12/2022 0446   BILITOT 0.9 04/12/2022 0446   GFRNONAA >60 04/12/2022 0446   GFRAA >60 10/30/2018  1059   Lipase     Component Value Date/Time   LIPASE 23 04/11/2022 1547       Studies/Results: CT Abdomen Pelvis W Contrast  Result Date: 04/11/2022 CLINICAL DATA:  Left lower quadrant abdominal pain. EXAM: CT ABDOMEN AND PELVIS WITH CONTRAST TECHNIQUE: Multidetector CT imaging of the abdomen and pelvis was performed using the standard protocol following bolus administration of intravenous contrast. RADIATION DOSE REDUCTION: This exam was performed according to the departmental dose-optimization program which includes automated exposure control, adjustment of the mA and/or kV according to patient size and/or use of iterative reconstruction technique. CONTRAST:  124mL OMNIPAQUE IOHEXOL 300 MG/ML  SOLN COMPARISON:  CT abdomen and pelvis 11/16/2020. FINDINGS: Lower chest: No acute abnormality. Hepatobiliary: No focal liver abnormality is seen. No gallstones, gallbladder wall thickening, or biliary dilatation. Pancreas: Unremarkable. No pancreatic ductal dilatation or surrounding inflammatory changes. Spleen: Normal in size without focal abnormality. Adrenals/Urinary Tract: Adrenal glands are unremarkable. Kidneys are normal, without renal calculi, focal lesion, or hydronephrosis. Bladder is unremarkable. Stomach/Bowel: The colon is diffusely distended with transition point at the level of the sigmoid colon. There is questionable mid sigmoid colon wall thickening. There are small surrounding lymph nodes in this region. There is some mild wall thickening of the splenic flexure of the colon with mild surrounding inflammation. No  pneumatosis or free air. Appendix, small bowel and stomach are within normal limits. Vascular/Lymphatic: No significant vascular findings are present. No enlarged abdominal or pelvic lymph nodes. Reproductive: Prostate is unremarkable. Other: Small fat containing umbilical hernia.  No ascites. Musculoskeletal: There is stable mild chronic compression fracture of T12. IMPRESSION: 1.  There is diffuse colonic distension with transition point in the mid sigmoid colon where there is sigmoid colon wall thickening with surrounding small lymph nodes. Can not exclude stricture or mass of the mid sigmoid colon with developing colonic obstruction. 2. Additional wall thickening and inflammation of the splenic flexure of the colon worrisome for nonspecific colitis. Electronically Signed   By: Ronney Asters M.D.   On: 04/11/2022 18:17    Anti-infectives: Anti-infectives (From admission, onward)    Start     Dose/Rate Route Frequency Ordered Stop   04/11/22 2145  cefTRIAXone (ROCEPHIN) 2 g in sodium chloride 0.9 % 100 mL IVPB        2 g 200 mL/hr over 30 Minutes Intravenous Every 24 hours 04/11/22 2143     04/11/22 2145  metroNIDAZOLE (FLAGYL) IVPB 500 mg        500 mg 100 mL/hr over 60 Minutes Intravenous Every 12 hours 04/11/22 2143          Assessment/Plan  Sigmoid diverticulitis - afebrile, VSS, WBC 12.6 from 11  - ongoing pain, bloating. Continue NPO w/ ice chips - continue IV abx - patient requesting IV dilaudid more frequently but on exam is not exquisitely tender. Will add PRN Toradol and robaxin, PRN mylicon, but would not increase IV narcotic.  - OOB/mobilize as able  - hopefully he will improve with IV abx abd bowel rest. If does, he will need outpatient follow up with GI for colonoscopy in 6-8 weeks. And I would recommend follow up with colorectal surgery to discuss elective partial colectomy. Failure to improve this admission would almost certainly result in exploratory laparotomy and colostomy.   LOS: 1 day   I reviewed nursing notes, hospitalist notes, last 24 h vitals and pain scores, last 48 h intake and output, last 24 h labs and trends, and last 24 h imaging results.   Obie Dredge, PA-C [facility] Please see Amion for pager number during day hours 7:00am-4:30pm

## 2022-04-12 NOTE — Hospital Course (Signed)
46 y.o. male with medical history significant for hypertension and recurrent diverticulitis, now presenting to the emergency department for evaluation of left lower quadrant abdominal pain.  The patient reports that he developed left lower quadrant abdominal pain on 04/09/2022, consistent with his prior experiences with sigmoid diverticulitis.  He has had nausea and 1 episode of vomiting near the time of symptom onset.  He had a normal bowel movement on 04/09/2022 and has continued to pass flatus.  Denies melena or hematochezia.   ED Course: Upon arrival to the ED, patient is found to be afebrile and saturating well on room air with elevated blood pressure.  CT of the abdomen and pelvis is notable for diffuse colonic distention with transition point in the mid sigmoid colon where there is wall thickening and surrounding small lymph nodes.  Surgery was consulted

## 2022-04-12 NOTE — Progress Notes (Signed)
  Progress Note   Patient: Paul Gilmore QBV:[id number] DOB: [birth year] DOA: 04/11/2022     1 DOS: the patient was seen and examined on 04/12/2022   Brief hospital course: 46 y.o. male with medical history significant for hypertension and recurrent diverticulitis, now presenting to the emergency department for evaluation of left lower quadrant abdominal pain.  The patient reports that he developed left lower quadrant abdominal pain on 04/09/2022, consistent with his prior experiences with sigmoid diverticulitis.  He has had nausea and 1 episode of vomiting near the time of symptom onset.  He had a normal bowel movement on 04/09/2022 and has continued to pass flatus.  Denies melena or hematochezia.   ED Course: Upon arrival to the ED, patient is found to be afebrile and saturating well on room air with elevated blood pressure.  CT of the abdomen and pelvis is notable for diffuse colonic distention with transition point in the mid sigmoid colon where there is wall thickening and surrounding small lymph nodes.  Surgery was consulted   Assessment and Plan: No notes have been filed under this hospital service. Service: Hospitalist 1. Acute diverticulitis  - Pt with hx of recurrent sigmoid diverticulitis p/w LLQ pain and found to have wall-thickening and inflammation with proximal distension raising question of developing SBO  - General Surgery consulted, recs to continue with bowel rest and IV abx for now to see if pt would need urgent surgery - Reports distension has improved with bowel sounds - Continue Rocephin and Flagyl   2. Hypertensive urgency  - Presenting BP 170-190/110-120 in ED  - Treat pain -cont with analgesia as needed -Cont PRN labetalol, have also ordered PRN hydralazine -Pt on ARB and norvasc prior to admit, will cont on IV enaliprat while NPO        Subjective: General exam: Awake, laying in bed, in nad Respiratory system: Normal respiratory effort, no wheezing Cardiovascular  system: regular rate, s1, s2 Gastrointestinal system: Soft, less distended, positive BS in all quadrants Central nervous system: CN2-12 grossly intact, strength intact Extremities: Perfused, no clubbing Skin: Normal skin turgor, no notable skin lesions seen Psychiatry: Mood normal // no visual hallucinations   Physical Exam: Vitals:   04/12/22 0022 04/12/22 0040 04/12/22 0404 04/12/22 1545  BP: (!) 189/122 (!) 181/119 (!) 181/120 (!) 187/106  Pulse: 91 73 78 (!) 59  Resp: 18 18  16   Temp: 97.8 F (36.6 C) 97.9 F (36.6 C) 98.4 F (36.9 C)   TempSrc: Oral Oral Oral   SpO2: 95% 95% 96% 100%  Weight:      Height:         Data Reviewed:  Labs reviewed: Na 136, WBC 12.6   Family Communication: Pt in room, family at bedside  Disposition: Status is: Inpatient Remains inpatient appropriate because: Severity of illness   Planned Discharge Destination: Home     Author: , MD 04/12/2022 4:14 PM  For on call review [email or web address].

## 2022-04-12 NOTE — H&P (Signed)
History and Physical    Paul Gilmore ZOX:[id number] DOB: 17-Aug-1976 DOA: 04/11/2022  PCP: Pcp, No   Patient coming from: Home   Chief Complaint: Abdominal pain   HPI: Paul Gilmore is a pleasant 46 y.o. male with medical history significant for hypertension and recurrent diverticulitis, now presenting to the emergency department for evaluation of left lower quadrant abdominal pain.  The patient reports that he developed left lower quadrant abdominal pain on 04/09/2022, consistent with his prior experiences with sigmoid diverticulitis.  He has had nausea and 1 episode of vomiting near the time of symptom onset.  He had a normal bowel movement on 04/09/2022 and has continued to pass flatus.  Denies melena or hematochezia.  ED Course: Upon arrival to the ED, patient is found to be afebrile and saturating well on room air with elevated blood pressure.  CT of the abdomen and pelvis is notable for diffuse colonic distention with transition point in the mid sigmoid colon where there is wall thickening and surrounding small lymph nodes.  Surgery was consulted by the ED NP and the patient was treated with a liter of NS, fentanyl, Norco, and Zofran.  Review of Systems:  All other systems reviewed and apart from HPI, are negative.  Past Medical History:  Diagnosis Date   Allergy    Anxiety    Bowel obstruction (HCC)    Diverticulitis of large intestine with perforation and abscess 02/2018   GERD (gastroesophageal reflux disease)    HTN (hypertension)     Past Surgical History:  Procedure Laterality Date   NO PAST SURGERIES      Social History:   reports that he has never smoked. He quit smokeless tobacco use about 7 years ago.  His smokeless tobacco use included chew. He reports current alcohol use. He reports current drug use. Drug: Marijuana.  No Known Allergies  Family History  Problem Relation Age of Onset   COPD Mother    Diverticulitis Mother    Alcohol abuse Father    Hypertension  Father    Throat cancer Father    Esophageal cancer Father    Colon cancer Neg Hx    Rectal cancer Neg Hx    Stomach cancer Neg Hx      Prior to Admission medications   Medication Sig Start Date End Date Taking? Authorizing Provider  acetaminophen (TYLENOL) 500 MG tablet Take 1,000 mg by mouth every 8 (eight) hours as needed for moderate pain.   Yes [provider]  albuterol (PROVENTIL HFA;VENTOLIN HFA) 108 (90 Base) MCG/ACT inhaler Inhale 1 puff into the lungs every 6 (six) hours as needed for wheezing or shortness of breath.   Yes [provider]  amLODipine (NORVASC) 10 MG tablet Take 1 tablet (10 mg total) by mouth daily. Patient not taking: Reported on 04/11/2022 11/21/20 02/19/21  Noralee Stain, DO  hydrochlorothiazide (MICROZIDE) 12.5 MG capsule Take 1 capsule (12.5 mg total) by mouth daily. Patient not taking: Reported on 04/11/2022 11/21/20   Noralee Stain, DO  losartan (COZAAR) 50 MG tablet Take 1 tablet (50 mg total) by mouth daily. Patient not taking: Reported on 04/11/2022 11/21/20   Noralee Stain, DO    Physical Exam: Vitals:   04/11/22 2202 04/11/22 2328 04/12/22 0022 04/12/22 0040  BP: (!) 168/110 (!) 169/107 (!) 189/122 (!) 181/119  Pulse: 84 82 91 73  Resp: Temp:  98 F (36.7 C) 97.8 F (36.6 C) 97.9 F (36.6 C)  TempSrc:  Oral Oral Oral  SpO2: 95% 95% 95% 95%  Weight:      Height:        Constitutional: NAD, calm  Eyes: PERTLA, lids and conjunctivae normal ENMT: Mucous membranes are moist. Posterior pharynx clear of any exudate or lesions.   Neck: supple, no masses  Respiratory: no wheezing, no crackles. No accessory muscle use.  Cardiovascular: S1 & S2 heard, regular rate and rhythm. No extremity edema.   Abdomen: soft, tender in LLQ without rebound pain or guarding. Bowel sounds active.  Musculoskeletal: no clubbing / cyanosis. No joint deformity upper and lower extremities.   Skin: no significant rashes, lesions,  ulcers. Warm, dry, well-perfused. Neurologic: CN 2-12 grossly intact. Moving all extremities. Alert and oriented.  Psychiatric: Pleasant. Cooperative.    Labs and Imaging on Admission: I have personally reviewed following labs and imaging studies  CBC: Recent Labs  Lab 04/11/22 1547  WBC 11.8*  NEUTROABS 9.3*  HGB 18.2*  HCT 50.1  MCV 87.6  PLT 340   Basic Metabolic Panel: Recent Labs  Lab 04/11/22 1547  NA 134*  K 3.9  CL 102  CO2 20*  GLUCOSE 98  BUN 17  CREATININE 0.90  CALCIUM 9.1   GFR: Estimated Creatinine Clearance: 132.5 mL/min (by C-G formula based on SCr of 0.9 mg/dL). Liver Function Tests: Recent Labs  Lab 04/11/22 1547  AST 17  ALT 16  ALKPHOS 81  BILITOT 1.3*  PROT 7.8  ALBUMIN 4.2   Recent Labs  Lab 04/11/22 1547  LIPASE 23   No results for input(s): AMMONIA in the last 168 hours. Coagulation Profile: No results for input(s): INR, PROTIME in the last 168 hours. Cardiac Enzymes: No results for input(s): CKTOTAL, CKMB, CKMBINDEX, TROPONINI in the last 168 hours. BNP (last 3 results) No results for input(s): PROBNP in the last 8760 hours. HbA1C: No results for input(s): HGBA1C in the last 72 hours. CBG: No results for input(s): GLUCAP in the last 168 hours. Lipid Profile: No results for input(s): CHOL, HDL, LDLCALC, TRIG, CHOLHDL, LDLDIRECT in the last 72 hours. Thyroid Function Tests: No results for input(s): TSH, T4TOTAL, FREET4, T3FREE, THYROIDAB in the last 72 hours. Anemia Panel: No results for input(s): VITAMINB12, FOLATE, FERRITIN, TIBC, IRON, RETICCTPCT in the last 72 hours. Urine analysis:    Component Value Date/Time   COLORURINE AMBER (A) 04/11/2022 1812   APPEARANCEUR CLEAR 04/11/2022 1812   LABSPEC >1.046 (H) 04/11/2022 1812   PHURINE 5.0 04/11/2022 1812   GLUCOSEU NEGATIVE 04/11/2022 1812   HGBUR NEGATIVE 04/11/2022 1812   BILIRUBINUR NEGATIVE 04/11/2022 1812   KETONESUR 80 (A) 04/11/2022 1812   PROTEINUR 100 (A)  04/11/2022 1812   NITRITE NEGATIVE 04/11/2022 1812   LEUKOCYTESUR NEGATIVE 04/11/2022 1812   Sepsis Labs: @LABRCNTIP (procalcitonin:4,lacticidven:4) ) Recent Results (from the past 240 hour(s))  Resp Panel by RT-PCR (Flu A&B, Covid) Nasopharyngeal Swab     Status: None   Collection Time: 04/11/22  9:31 PM   Specimen: Nasopharyngeal Swab; Nasopharyngeal(NP) swabs in vial transport medium  Result Value Ref Range Status   SARS Coronavirus 2 by RT PCR NEGATIVE NEGATIVE Final    Comment: (NOTE) SARS-CoV-2 target nucleic acids are NOT DETECTED.  The SARS-CoV-2 RNA is generally detectable in upper respiratory specimens during the acute phase of infection. The lowest concentration of SARS-CoV-2 viral copies this assay can detect is 138 copies/mL. A negative result does not preclude SARS-Cov-2 infection and should not be used as the sole basis for treatment or other patient  management decisions. A negative result may occur with  improper specimen collection/handling, submission of specimen other than nasopharyngeal swab, presence of viral mutation(s) within the areas targeted by this assay, and inadequate number of viral copies(<138 copies/mL). A negative result must be combined with clinical observations, patient history, and epidemiological information. The expected result is Negative.  Fact Sheet for Patients:  BloggerCourse.com  Fact Sheet for Healthcare Providers:  SeriousBroker.it  This test is no t yet approved or cleared by the [place] FDA and  has been authorized for detection and/or diagnosis of SARS-CoV-2 by FDA under an Emergency Use Authorization (EUA). This EUA will remain  in effect (meaning this test can be used) for the duration of the COVID-19 declaration under Section 564(b)(1) of the Act, 21 U.S.C.section 360bbb-3(b)(1), unless the authorization is terminated  or revoked sooner.       Influenza A by PCR  NEGATIVE NEGATIVE Final   Influenza B by PCR NEGATIVE NEGATIVE Final    Comment: (NOTE) The Xpert Xpress SARS-CoV-2/FLU/RSV plus assay is intended as an aid in the diagnosis of influenza from Nasopharyngeal swab specimens and should not be used as a sole basis for treatment. Nasal washings and aspirates are unacceptable for Xpert Xpress SARS-CoV-2/FLU/RSV testing.  Fact Sheet for Patients: BloggerCourse.com  Fact Sheet for Healthcare Providers: SeriousBroker.it  This test is not yet approved or cleared by the [place] FDA and has been authorized for detection and/or diagnosis of SARS-CoV-2 by FDA under an Emergency Use Authorization (EUA). This EUA will remain in effect (meaning this test can be used) for the duration of the COVID-19 declaration under Section 564(b)(1) of the Act, 21 U.S.C. section 360bbb-3(b)(1), unless the authorization is terminated or revoked.  Performed at [facility], [address] [place], [place] [address]      Radiological Exams on Admission: CT Abdomen Pelvis W Contrast  Result Date: 04/11/2022 CLINICAL DATA:  Left lower quadrant abdominal pain. EXAM: CT ABDOMEN AND PELVIS WITH CONTRAST TECHNIQUE: Multidetector CT imaging of the abdomen and pelvis was performed using the standard protocol following bolus administration of intravenous contrast. RADIATION DOSE REDUCTION: This exam was performed according to the departmental dose-optimization program which includes automated exposure control, adjustment of the mA and/or kV according to patient size and/or use of iterative reconstruction technique. CONTRAST:  OMNIPAQUE IOHEXOL 300 MG/ML  SOLN COMPARISON:  CT abdomen and pelvis 11/16/2020. FINDINGS: Lower chest: No acute abnormality. Hepatobiliary: No focal liver abnormality is seen. No gallstones, gallbladder wall thickening, or biliary dilatation. Pancreas: Unremarkable. No  pancreatic ductal dilatation or surrounding inflammatory changes. Spleen: Normal in size without focal abnormality. Adrenals/Urinary Tract: Adrenal glands are unremarkable. Kidneys are normal, without renal calculi, focal lesion, or hydronephrosis. Bladder is unremarkable. Stomach/Bowel: The colon is diffusely distended with transition point at the level of the sigmoid colon. There is questionable mid sigmoid colon wall thickening. There are small surrounding lymph nodes in this region. There is some mild wall thickening of the splenic flexure of the colon with mild surrounding inflammation. No pneumatosis or free air. Appendix, small bowel and stomach are within normal limits. Vascular/Lymphatic: No significant vascular findings are present. No enlarged abdominal or pelvic lymph nodes. Reproductive: Prostate is unremarkable. Other: Small fat containing umbilical hernia.  No ascites. Musculoskeletal: There is stable mild chronic compression fracture of T12. IMPRESSION: 1. There is diffuse colonic distension with transition point in the mid sigmoid colon where there is sigmoid colon wall thickening with surrounding small lymph nodes. Can not  exclude stricture or mass of the mid sigmoid colon with developing colonic obstruction. 2. Additional wall thickening and inflammation of the splenic flexure of the colon worrisome for nonspecific colitis. Electronically Signed   By: Darliss Cheney M.D.   On: 04/11/2022 18:17     Assessment/Plan  1. Acute diverticulitis  - Pt with hx of recurrent sigmoid diverticulitis p/w LLQ pain and found to have wall-thickening and inflammation with proximal distension raising question of developing SBO  - Appreciate surgery consultation, ?recurrent diverticulitis with developing stricture  - Start Rocephin and Flagyl, continue bowel rest, supportive care    2. Hypertensive urgency  - BP 170-190/110-120 in ED in setting of pain and no longer taking his antihypertensives  - Treat  pain, use IV labetalol as needed for now    DVT prophylaxis: Lovenox  Code Status: Full  Level of Care: Level of care: Med-Surg Family Communication: wife updated at bedside  Disposition Plan:  Patient is from: home  Anticipated d/c is to: Home  Anticipated d/c date is: 04/15/22  Patient currently: Pending clinical course, may need surgery if worsens or fails to improve with medical management  Consults called: Surgery  Admission status: Inpatient     Briscoe Deutscher, MD [facility]  04/12/2022, 1:49 AM

## 2022-04-12 NOTE — Progress Notes (Signed)
Transition of Care [facility]) Screening Note  Patient Details  Name: Paul Gilmore Date of Birth: 06-Jan-1976  [facility]) CM/SW Contact:    Sherie Don, LCSW Phone Number: 04/12/2022, 10:31 AM  Transition of Care Department [facility]) has reviewed patient and no TOC needs have been identified at this time. We will continue to monitor patient advancement through interdisciplinary progression rounds. If new patient transition needs arise, please place a TOC consult.

## 2022-04-13 ENCOUNTER — Inpatient Hospital Stay (HOSPITAL_COMMUNITY): Payer: Commercial Managed Care - PPO

## 2022-04-13 DIAGNOSIS — R1032 Left lower quadrant pain: Secondary | ICD-10-CM | POA: Diagnosis not present

## 2022-04-13 DIAGNOSIS — I160 Hypertensive urgency: Secondary | ICD-10-CM | POA: Diagnosis not present

## 2022-04-13 DIAGNOSIS — K5792 Diverticulitis of intestine, part unspecified, without perforation or abscess without bleeding: Secondary | ICD-10-CM | POA: Diagnosis not present

## 2022-04-13 LAB — CBC
HCT: 48.3 % (ref 39.0–52.0)
Hemoglobin: 17.2 g/dL — ABNORMAL HIGH (ref 13.0–17.0)
MCH: 31.7 pg (ref 26.0–34.0)
MCHC: 35.6 g/dL (ref 30.0–36.0)
MCV: 89.1 fL (ref 80.0–100.0)
Platelets: 314 10*3/uL (ref 150–400)
RBC: 5.42 MIL/uL (ref 4.22–5.81)
RDW: 12 % (ref 11.5–15.5)
WBC: 12.9 10*3/uL — ABNORMAL HIGH (ref 4.0–10.5)
nRBC: 0 % (ref 0.0–0.2)

## 2022-04-13 LAB — BASIC METABOLIC PANEL
Anion gap: 8 (ref 5–15)
BUN: 15 mg/dL (ref 6–20)
CO2: 25 mmol/L (ref 22–32)
Calcium: 8.9 mg/dL (ref 8.9–10.3)
Chloride: 102 mmol/L (ref 98–111)
Creatinine, Ser: 0.88 mg/dL (ref 0.61–1.24)
GFR, Estimated: >60 mL/min (ref >60)
Glucose, Bld: 116 mg/dL — ABNORMAL HIGH (ref 70–99)
Potassium: 4.4 mmol/L (ref 3.5–5.1)
Sodium: 135 mmol/L (ref 135–145)

## 2022-04-13 LAB — CEA: CEA: 4.2 ng/mL (ref 0.0–4.7)

## 2022-04-13 MED ORDER — ENALAPRILAT 1.25 MG/ML IV SOLN
1.2500 mg | Freq: Four times a day (QID) | INTRAVENOUS | Status: AC
  Administered 2022-04-13 – 2022-04-16 (×11): 1.25 mg via INTRAVENOUS
  Filled 2022-04-13 (×11): qty 1

## 2022-04-13 MED ORDER — PIPERACILLIN-TAZOBACTAM 3.375 G IVPB
3.3750 g | Freq: Three times a day (TID) | INTRAVENOUS | Status: DC
  Administered 2022-04-13 – 2022-04-14 (×4): 3.375 g via INTRAVENOUS
  Filled 2022-04-13 (×4): qty 50

## 2022-04-13 MED ORDER — LACTATED RINGERS IV SOLN: INTRAVENOUS | Status: DC

## 2022-04-13 NOTE — Progress Notes (Signed)
[facility] Progress Note     Subjective: CC:  Patient does not feel any better- c/o lower abdominal discomfort as well as significant distention - wishes he could throw up, burp, or pass gas to get relief. Does report a small amt of flatus last night.   Objective: Vital signs in last 24 hours: Temp:  [97.7 F (36.5 C)] 97.7 F (36.5 C) (05/19 0448) Pulse Rate:  [59-69] 64 (05/19 0448) Resp:  [12-16] 12 (05/19 0448) BP: (181-194)/(106-120) 184/112 (05/19 0546) SpO2:  [97 %-100 %] 97 % (05/19 0448) Last BM Date : 04/09/22  Intake/Output from previous day: 05/18 0701 - 05/19 0700 In: 1972.7 [P.O.:120; I.V.:1552.6; IV Piggyback:300] Out: 1275 [Urine:1275] Intake/Output this shift: No intake/output data recorded.  PE: Gen:  Alert, NAD, cooperative Card:  Regular rate and rhythm Pulm:  Normal effort ORA Abd: Soft, obese, mild distention but not tense, lower abdominal tenderness without rebound or guarding. Skin: warm and dry, no rashes  Psych: A&Ox3   Lab Results:  Recent Labs    04/12/22 0446 04/13/22 0441  WBC 12.6* 12.9*  HGB 16.8 17.2*  HCT 48.3 48.3  PLT 308 314   BMET Recent Labs    04/12/22 0446 04/13/22 0441  NA 136 135  K 4.2 4.4  CL 104 102  CO2 22 25  GLUCOSE 102* 116*  BUN 15 15  CREATININE 0.89 0.88  CALCIUM 8.5* 8.9   PT/INR No results for input(s): LABPROT, INR in the last 72 hours. CMP     Component Value Date/Time   NA 135 04/13/2022 0441   K 4.4 04/13/2022 0441   CL 102 04/13/2022 0441   CO2 25 04/13/2022 0441   GLUCOSE 116 (H) 04/13/2022 0441   BUN 15 04/13/2022 0441   CREATININE 0.88 04/13/2022 0441   CALCIUM 8.9 04/13/2022 0441   PROT 6.7 04/12/2022 0446   ALBUMIN 3.6 04/12/2022 0446   AST 15 04/12/2022 0446   ALT 15 04/12/2022 0446   ALKPHOS 68 04/12/2022 0446   BILITOT 0.9 04/12/2022 0446   GFRNONAA >60 04/13/2022 0441   GFRAA >60 10/30/2018 1059   Lipase     Component Value Date/Time   LIPASE 23  04/11/2022 1547       Studies/Results: CT Abdomen Pelvis W Contrast  Result Date: 04/11/2022 CLINICAL DATA:  Left lower quadrant abdominal pain. EXAM: CT ABDOMEN AND PELVIS WITH CONTRAST TECHNIQUE: Multidetector CT imaging of the abdomen and pelvis was performed using the standard protocol following bolus administration of intravenous contrast. RADIATION DOSE REDUCTION: This exam was performed according to the departmental dose-optimization program which includes automated exposure control, adjustment of the mA and/or kV according to patient size and/or use of iterative reconstruction technique. CONTRAST:  OMNIPAQUE IOHEXOL 300 MG/ML  SOLN COMPARISON:  CT abdomen and pelvis 11/16/2020. FINDINGS: Lower chest: No acute abnormality. Hepatobiliary: No focal liver abnormality is seen. No gallstones, gallbladder wall thickening, or biliary dilatation. Pancreas: Unremarkable. No pancreatic ductal dilatation or surrounding inflammatory changes. Spleen: Normal in size without focal abnormality. Adrenals/Urinary Tract: Adrenal glands are unremarkable. Kidneys are normal, without renal calculi, focal lesion, or hydronephrosis. Bladder is unremarkable. Stomach/Bowel: The colon is diffusely distended with transition point at the level of the sigmoid colon. There is questionable mid sigmoid colon wall thickening. There are small surrounding lymph nodes in this region. There is some mild wall thickening of the splenic flexure of the colon with mild surrounding inflammation. No pneumatosis or free air. Appendix, small bowel and stomach are  within normal limits. Vascular/Lymphatic: No significant vascular findings are present. No enlarged abdominal or pelvic lymph nodes. Reproductive: Prostate is unremarkable. Other: Small fat containing umbilical hernia.  No ascites. Musculoskeletal: There is stable mild chronic compression fracture of T12. IMPRESSION: 1. There is diffuse colonic distension with transition point in  the mid sigmoid colon where there is sigmoid colon wall thickening with surrounding small lymph nodes. Can not exclude stricture or mass of the mid sigmoid colon with developing colonic obstruction. 2. Additional wall thickening and inflammation of the splenic flexure of the colon worrisome for nonspecific colitis. Electronically Signed   By: Darliss Cheney M.D.   On: 04/11/2022 18:17    Anti-infectives: Anti-infectives (From admission, onward)    Start     Dose/Rate Route Frequency Ordered Stop   04/13/22 0900  piperacillin-tazobactam (ZOSYN) IVPB 3.375 g        3.375 g 12.5 mL/hr over 240 Minutes Intravenous Every 8 hours 04/13/22 0814     04/11/22 2145  cefTRIAXone (ROCEPHIN) 2 g in sodium chloride 0.9 % 100 mL IVPB  Status:  Discontinued        2 g 200 mL/hr over 30 Minutes Intravenous Every 24 hours 04/11/22 2143 04/13/22 0814   04/11/22 2145  metroNIDAZOLE (FLAGYL) IVPB 500 mg  Status:  Discontinued        500 mg 100 mL/hr over 60 Minutes Intravenous Every 12 hours 04/11/22 2143 04/13/22 0814        Assessment/Plan  Sigmoid diverticulitis - afebrile, VSS, WBC 12.9 from 12.6 - clinically the patient is not improving -ongoing pain, bloating. Continue NPO w/ ice chips; check KUB - transition from rocephin/flagyl to IV Zosyn - OOB/mobilize as able  - I am concerned about this patients lack of clinical progress after 48 hours of IV abx. Will change abx as above. There are no emergent indications for surgery - hemodynamically stable without peritonitis. But if he does not improve then I think ex lap, partial colectomy, colostomy will be necessary. We will follow closely.   LOS: 2 days   I reviewed nursing notes, hospitalist notes, last 24 h vitals and pain scores, last 48 h intake and output, last 24 h labs and trends, and last 24 h imaging results.   Hosie Spangle, PA-C [facility] Please see Amion for pager number during day hours 7:00am-4:30pm

## 2022-04-13 NOTE — Progress Notes (Signed)
  Progress Note   Patient: Paul Gilmore DDU:[id number] DOB: [birth year] DOA: 04/11/2022     2 DOS: the patient was seen and examined on 04/13/2022   Brief hospital course: 46 y.o. male with medical history significant for hypertension and recurrent diverticulitis, now presenting to the emergency department for evaluation of left lower quadrant abdominal pain.  The patient reports that he developed left lower quadrant abdominal pain on 04/09/2022, consistent with his prior experiences with sigmoid diverticulitis.  He has had nausea and 1 episode of vomiting near the time of symptom onset.  He had a normal bowel movement on 04/09/2022 and has continued to pass flatus.  Denies melena or hematochezia.   ED Course: Upon arrival to the ED, patient is found to be afebrile and saturating well on room air with elevated blood pressure.  CT of the abdomen and pelvis is notable for diffuse colonic distention with transition point in the mid sigmoid colon where there is wall thickening and surrounding small lymph nodes.  Surgery was consulted   Assessment and Plan: No notes have been filed under this hospital service. Service: Hospitalist 1. Acute diverticulitis  - Pt with hx of recurrent sigmoid diverticulitis p/w LLQ pain and found to have wall-thickening and inflammation with proximal distension raising question of developing SBO  - General Surgery following -Abx changed to zosyn -Increased abd pains overnight. Per Surgery, if still no significant improvement, then may need sigmoid resection with possible colostomy   2. Hypertensive urgency  - Presenting BP 170-190/110-120 in ED  -cont with analgesia as needed -Cont PRN labetalol, have also ordered PRN hydralazine -Pt on ARB and norvasc prior to admit, increased enaliprat to 1.25mg       General exam: Conversant, in no acute distress Respiratory system: normal chest rise, clear, no audible wheezing Cardiovascular system: regular rhythm,  s1-s2 Gastrointestinal system: Nondistended, nontender, pos BS Central nervous system: No seizures, no tremors Extremities: No cyanosis, no joint deformities Skin: No rashes, no pallor Psychiatry: Affect normal // no auditory hallucinations   Physical Exam: Vitals:   04/13/22 0448 04/13/22 0546 04/13/22 1146 04/13/22 1325  BP: (!) 185/110 (!) 184/112 (!) 176/77 (!) 182/108  Pulse: 64   61  Resp: 12   16  Temp: 97.7 F (36.5 C)   98.4 F (36.9 C)  TempSrc: Oral     SpO2: 97%   96%  Weight:      Height:         Data Reviewed:  Labs reviewed: Na 135, WBC 12.9   Family Communication: Pt in room, family at bedside  Disposition: Status is: Inpatient Remains inpatient appropriate because: Severity of illness   Planned Discharge Destination: Home     Author: Rickey Barbara, MD 04/13/2022 4:32 PM  For on call review [email or web address].

## 2022-04-14 DIAGNOSIS — I10 Essential (primary) hypertension: Secondary | ICD-10-CM | POA: Diagnosis not present

## 2022-04-14 DIAGNOSIS — M1A0720 Idiopathic chronic gout, left ankle and foot, without tophus (tophi): Secondary | ICD-10-CM

## 2022-04-14 DIAGNOSIS — K56600 Partial intestinal obstruction, unspecified as to cause: Secondary | ICD-10-CM

## 2022-04-14 DIAGNOSIS — K5732 Diverticulitis of large intestine without perforation or abscess without bleeding: Secondary | ICD-10-CM

## 2022-04-14 DIAGNOSIS — L723 Sebaceous cyst: Secondary | ICD-10-CM | POA: Insufficient documentation

## 2022-04-14 LAB — CBC
HCT: 47.3 % (ref 39.0–52.0)
Hemoglobin: 17.1 g/dL — ABNORMAL HIGH (ref 13.0–17.0)
MCH: 31.3 pg (ref 26.0–34.0)
MCHC: 36.2 g/dL — ABNORMAL HIGH (ref 30.0–36.0)
MCV: 86.5 fL (ref 80.0–100.0)
Platelets: 333 10*3/uL (ref 150–400)
RBC: 5.47 MIL/uL (ref 4.22–5.81)
RDW: 12 % (ref 11.5–15.5)
WBC: 13 10*3/uL — ABNORMAL HIGH (ref 4.0–10.5)
nRBC: 0 % (ref 0.0–0.2)

## 2022-04-14 LAB — COMPREHENSIVE METABOLIC PANEL
ALT: 17 U/L (ref 0–44)
AST: 18 U/L (ref 15–41)
Albumin: 3.4 g/dL — ABNORMAL LOW (ref 3.5–5.0)
Alkaline Phosphatase: 69 U/L (ref 38–126)
Anion gap: 9 (ref 5–15)
BUN: 11 mg/dL (ref 6–20)
CO2: 29 mmol/L (ref 22–32)
Calcium: 8.9 mg/dL (ref 8.9–10.3)
Chloride: 97 mmol/L — ABNORMAL LOW (ref 98–111)
Creatinine, Ser: 0.89 mg/dL (ref 0.61–1.24)
GFR, Estimated: >60 mL/min (ref >60)
Glucose, Bld: 106 mg/dL — ABNORMAL HIGH (ref 70–99)
Potassium: 4.4 mmol/L (ref 3.5–5.1)
Sodium: 135 mmol/L (ref 135–145)
Total Bilirubin: 1.1 mg/dL (ref 0.3–1.2)
Total Protein: 6.7 g/dL (ref 6.5–8.1)

## 2022-04-14 LAB — CK: Total CK: 66 U/L (ref 49–397)

## 2022-04-14 MED ORDER — PROCHLORPERAZINE EDISYLATE 10 MG/2ML IJ SOLN: 5.0000 mg | INTRAMUSCULAR | Status: DC | PRN

## 2022-04-14 MED ORDER — DIPHENHYDRAMINE HCL 50 MG/ML IJ SOLN
12.5000 mg | Freq: Four times a day (QID) | INTRAMUSCULAR | Status: DC | PRN
  Filled 2022-04-14: qty 1

## 2022-04-14 MED ORDER — ALUM & MAG HYDROXIDE-SIMETH 200-200-20 MG/5ML PO SUSP: 30.0000 mL | Freq: Four times a day (QID) | ORAL | Status: DC | PRN

## 2022-04-14 MED ORDER — SODIUM CHLORIDE 0.9 % IV SOLN
8.0000 mg | Freq: Four times a day (QID) | INTRAVENOUS | Status: DC | PRN
  Administered 2022-04-16: 8 mg via INTRAVENOUS
  Filled 2022-04-14: qty 8

## 2022-04-14 MED ORDER — SIMETHICONE 80 MG PO CHEW
80.0000 mg | CHEWABLE_TABLET | Freq: Four times a day (QID) | ORAL | Status: DC
  Administered 2022-04-14 – 2022-04-16 (×10): 80 mg via ORAL
  Filled 2022-04-14 (×11): qty 1

## 2022-04-14 MED ORDER — LIP MEDEX EX OINT
1.0000 "application " | TOPICAL_OINTMENT | Freq: Two times a day (BID) | CUTANEOUS | Status: DC
  Administered 2022-04-14 – 2022-04-21 (×14): 1 via TOPICAL
  Filled 2022-04-14: qty 7

## 2022-04-14 MED ORDER — PHENOL 1.4 % MT LIQD
2.0000 | OROMUCOSAL | Status: DC | PRN
Start: 2022-04-14 — End: 2022-04-22

## 2022-04-14 MED ORDER — PIPERACILLIN-TAZOBACTAM 3.375 G IVPB
3.3750 g | Freq: Three times a day (TID) | INTRAVENOUS | Status: DC
  Administered 2022-04-14 – 2022-04-22 (×23): 3.375 g via INTRAVENOUS
  Filled 2022-04-14 (×23): qty 50

## 2022-04-14 MED ORDER — MENTHOL 3 MG MT LOZG: 1.0000 | LOZENGE | OROMUCOSAL | Status: DC | PRN

## 2022-04-14 MED ORDER — MAGIC MOUTHWASH
15.0000 mL | Freq: Four times a day (QID) | ORAL | Status: DC | PRN
Start: 2022-04-14 — End: 2022-04-22

## 2022-04-14 MED ORDER — ONDANSETRON HCL 4 MG/2ML IJ SOLN
4.0000 mg | Freq: Four times a day (QID) | INTRAMUSCULAR | Status: DC | PRN
  Administered 2022-04-14 – 2022-04-16 (×5): 4 mg via INTRAVENOUS
  Filled 2022-04-14 (×5): qty 2

## 2022-04-14 MED ORDER — LACTATED RINGERS IV BOLUS
1000.0000 mL | Freq: Three times a day (TID) | INTRAVENOUS | Status: AC | PRN
Start: 2022-04-14 — End: 2022-04-16

## 2022-04-14 MED ORDER — GABAPENTIN 300 MG PO CAPS
300.0000 mg | ORAL_CAPSULE | Freq: Two times a day (BID) | ORAL | Status: DC
  Administered 2022-04-14: 300 mg via ORAL
  Filled 2022-04-14: qty 1

## 2022-04-14 NOTE — Progress Notes (Signed)
  Progress Note   Patient: Paul Gilmore QZR:[id number] DOB: [birth year] DOA: 04/11/2022     3 DOS: the patient was seen and examined on 04/14/2022   Brief hospital course: 46 y.o. male with medical history significant for hypertension and recurrent diverticulitis, now presenting to the emergency department for evaluation of left lower quadrant abdominal pain.  The patient reports that he developed left lower quadrant abdominal pain on 04/09/2022, consistent with his prior experiences with sigmoid diverticulitis.  He has had nausea and 1 episode of vomiting near the time of symptom onset.  He had a normal bowel movement on 04/09/2022 and has continued to pass flatus.  Denies melena or hematochezia.   ED Course: Upon arrival to the ED, patient is found to be afebrile and saturating well on room air with elevated blood pressure.  CT of the abdomen and pelvis is notable for diffuse colonic distention with transition point in the mid sigmoid colon where there is wall thickening and surrounding small lymph nodes.  Surgery was consulted   Assessment and Plan: No notes have been filed under this hospital service. Service: Hospitalist 1. Acute diverticulitis  - Pt with hx of recurrent sigmoid diverticulitis p/w LLQ pain and found to have wall-thickening and inflammation with proximal distension raising question of developing SBO  - General Surgery following -Now on zosyn -Cont with abd pain. Pt reports abd discomfort improved with flatus. Per Surgery, if still no significant improvement, then may need sigmoid resection with possible colostomy. General Surgery to re-eval on Monday   2. Hypertensive urgency  - Presenting BP 170-190/110-120 in ED  -cont with analgesia as needed -Cont PRN labetalol, have also ordered PRN hydralazine -Pt on ARB and norvasc prior to admit, now on enaliprat to 1.25mg  -Avoiding scheduled IV beta blocker secondary to baseline bradycardia      General exam: Awake, laying in bed,  in nad Respiratory system: Normal respiratory effort, no wheezing Cardiovascular system: regular rate, s1, s2 Gastrointestinal system: Soft, nondistended, positive BS Central nervous system: CN2-12 grossly intact, strength intact Extremities: Perfused, no clubbing Skin: Normal skin turgor, no notable skin lesions seen Psychiatry: Mood normal // no visual hallucinations   Physical Exam: Vitals:   04/13/22 2109 04/13/22 2357 04/14/22 0617 04/14/22 1252  BP: (!) 191/110 (!) 197/115 (!) 189/93 (!) 175/99  Pulse: 74  60 (!) 58  Resp: 18  18 16   Temp: 98.8 F (37.1 C)  98.4 F (36.9 C) 97.6 F (36.4 C)  TempSrc: Oral  Oral Oral  SpO2: 98%  97% 96%  Weight:   119.3 kg   Height:        Data Reviewed:  Labs reviewed: Na 135, WBC 13.0   Family Communication: Pt in room, family at bedside  Disposition: Status is: Inpatient Remains inpatient appropriate because: Severity of illness   Planned Discharge Destination: Home     Author: , MD 04/14/2022 4:58 PM  For on call review [email or web address].

## 2022-04-14 NOTE — Progress Notes (Addendum)
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  [facility] Stay = 3 days)  Assessment/Plan  Sigmoid diverticulitis - afebrile, VSS - partial colon obstruction from his recurrent sigmoid diverticulitis.  He had evidence of narrowing 4 years ago on colonoscopy. -Sips and simethicone to hopefully ride things out. -Some signs of flatus a guardedly hopeful sign but still very distended -Antibiotics: Rocephin/Flagyl 5/17-5/19 switched to Zosyn 5/19-.  I would give pip/tazo antibiotics 48 hours to see if he can get this infection control and open things up. -He is going to need a colectomy.  This has been discussed with him by our group numerous times in the past several years.  I think he understands and relents to consider that ("whatever you say doc, y'all are the experts").  Hopefully we can calm this down & he will open up enough to have MIS lap/robotic rectosigmoid resection with immediate anastomosis next month.  If he does not  improve by Monday, would plan Hartmann resection with temporary ostomy to get him over this attack and colostomy takedown 3-6 months later.  I am skeptical that flex sig and stent placement in the middle of attack would be wise and GI would hesitate.  Can try minimally invasive approach but with his distention it may not be possible.  We will see.  He would like to try and see if we can avoid a temporary bag.  Long discussion with the patient and his spouse at bedside.  Questions answered.  Expressed understanding appreciation.  -Gout, hypertension, other medical issues per primary service -VTE prophylaxis- SCDs, etc -mobilize as tolerated to help recovery  Disposition: TBD -suspected will be late May      I reviewed hospitalist notes, last 24 h vitals and pain scores, last 48 h intake and output, last 24 h labs and trends, and last 24 h imaging results. I have reviewed this patient's available data, including medical history, events of note, test results, etc as part of my evaluation.  A significant portion of that time was spent in counseling.  Care during the described time interval was provided by me.  This care required moderate level of medical decision making.  04/14/2022    Subjective: (Chief complaint)  Patient feeling less bloated.  Less abdominal pain.  Passing more gas.  Nausea less  Objective:  Vital signs:  Vitals:   04/13/22 1325 04/13/22 2109 04/13/22 2357 04/14/22 0617  BP: (!) 182/108 (!) 191/110 (!) 197/115 (!) 189/93  Pulse: 61 74  60  Resp: Temp: 98.4 F (36.9 C) 98.8  F (37.1 C)  98.4 F (36.9 C)  TempSrc:  Oral  Oral  SpO2: 96% 98%  97%  Weight:    119.3 kg  Height:        Last BM Date : 04/09/22  Intake/Output   Yesterday:  05/19 0701 - 05/20 0700 In: 1401.7 [I.V.:1251.7; IV Piggyback:150] Out: 1050 [Urine:1050] This shift:  Total I/O In: 0  Out: 250 [Urine:250]  Bowel function:  Flatus: YES  BM:  No  Drain: (No  drain)   Physical Exam:  General: Pt awake/alert in no acute distress Eyes: PERRL, normal EOM.  Sclera clear.  No icterus Neuro: CN II-XII intact w/o focal sensory/motor deficits. Lymph: No head/neck/groin lymphadenopathy Psych:  No delerium/psychosis/paranoia.  Oriented x 4 HENT: Normocephalic, Mucus membranes moist.  No thrush Neck: Supple, No tracheal deviation.  No obvious thyromegaly Chest: No pain to chest wall compression.  Good respiratory excursion.  No audible wheezing CV:  Pulses intact.  Regular rhythm.  No major extremity edema MS: Normal AROM mjr joints.  No obvious deformity  Abdomen: Obese but Soft.  Moderately distended.  Tenderness at LLQ mild -no rebound tenderness nor guarding .  No evidence of peritonitis.  No incarcerated hernias.  Ext:   No deformity.  No mjr edema.  No cyanosis Skin: No petechiae / purpurea.  No major sores.  Warm and dry    Results:   Cultures: Recent Results (from the past 720 hour(s))  Resp Panel by RT-PCR (Flu A&B, Covid) Nasopharyngeal Swab     Status: None   Collection Time: 04/11/22  9:31 PM   Specimen: Nasopharyngeal Swab; Nasopharyngeal(NP) swabs in vial transport medium  Result Value Ref Range Status   SARS Coronavirus 2 by RT PCR NEGATIVE NEGATIVE Final    Comment: (NOTE) SARS-CoV-2 target nucleic acids are NOT DETECTED.  The SARS-CoV-2 RNA is generally detectable in upper respiratory specimens during the acute phase of infection. The lowest concentration of SARS-CoV-2 viral copies this assay can detect is 138 copies/mL. A negative result does not preclude SARS-Cov-2 infection and should not be used as the sole basis for treatment or other patient management decisions. A negative result may occur with  improper specimen collection/handling, submission of specimen other than nasopharyngeal swab, presence of viral mutation(s) within the areas targeted by this assay, and inadequate number of viral copies(<138 copies/mL). A  negative result must be combined with clinical observations, patient history, and epidemiological information. The expected result is Negative.  Fact Sheet for Patients:  BloggerCourse.comhttps://www.fda.gov/media/152166/download  Fact Sheet for Healthcare Providers:  SeriousBroker.ithttps://www.fda.gov/media/152162/download  This test is no t yet approved or cleared by the [place] FDA and  has been authorized for detection and/or diagnosis of SARS-CoV-2 by FDA under an Emergency Use Authorization (EUA). This EUA will remain  in effect (meaning this test can be used) for the duration of the COVID-19 declaration under Section 564(b)(1) of the Act, 21 U.S.C.section 360bbb-3(b)(1), unless the authorization is terminated  or revoked sooner.       Influenza A by PCR NEGATIVE NEGATIVE Final   Influenza B by PCR NEGATIVE NEGATIVE Final    Comment: (NOTE) The Xpert Xpress SARS-CoV-2/FLU/RSV plus assay is intended as an aid in the diagnosis of influenza from Nasopharyngeal swab specimens and should not be used as a sole basis for treatment. Nasal washings and aspirates are unacceptable for Xpert Xpress SARS-CoV-2/FLU/RSV testing.  Fact Sheet for Patients: BloggerCourse.comhttps://www.fda.gov/media/152166/download  Fact Sheet for Healthcare Providers: SeriousBroker.ithttps://www.fda.gov/media/152162/download  This test is not yet approved or  cleared by the [place] and has been authorized for detection and/or diagnosis of SARS-CoV-2 by FDA under an Emergency Use Authorization (EUA). This EUA will remain in effect (meaning this test can be used) for the duration of the COVID-19 declaration under Section 564(b)(1) of the Act, 21 U.S.C. section 360bbb-3(b)(1), unless the authorization is terminated or revoked.  Performed at [facility], [address], [place], [place] [address]     Labs: Results for orders placed or performed during the hospital encounter of 04/11/22 (from the past 48 hour(s))  Urinalysis,  Routine w reflex microscopic     Status: Abnormal   Collection Time: 04/12/22  4:38 PM  Result Value Ref Range   Color, Urine AMBER (A) YELLOW    Comment: BIOCHEMICALS MAY BE AFFECTED BY COLOR   APPearance TURBID (A) CLEAR   Specific Gravity, Urine 1.034 (H) 1.005 - 1.030   pH 5.0 5.0 - 8.0   Glucose, UA NEGATIVE NEGATIVE mg/dL   Hgb urine dipstick NEGATIVE NEGATIVE   Bilirubin Urine NEGATIVE NEGATIVE   Ketones, ur 20 (A) NEGATIVE mg/dL   Protein, ur 30 (A) NEGATIVE mg/dL   Nitrite NEGATIVE NEGATIVE   Leukocytes,Ua NEGATIVE NEGATIVE   WBC, UA 0-5 0 - 5 WBC/hpf   Bacteria, UA NONE SEEN NONE SEEN   Mucus PRESENT    Amorphous Crystal PRESENT     Comment: Performed at [facility], [address], [place], [place] [address]  Basic metabolic panel     Status: Abnormal   Collection Time: 04/13/22  4:41 AM  Result Value Ref Range   Sodium 135 135 - 145 mmol/L   Potassium 4.4 3.5 - 5.1 mmol/L   Chloride 102 98 - 111 mmol/L   CO2 25 22 - 32 mmol/L   Glucose, Bld 116 (H) 70 - 99 mg/dL    Comment: Glucose reference range applies only to samples taken after fasting for at least 8 hours.   BUN 15 6 - 20 mg/dL   Creatinine, Ser 3.90 0.61 - 1.24 mg/dL   Calcium 8.9 8.9 - 30.0 mg/dL   GFR, Estimated >92 >33 mL/min    Comment: (NOTE) Calculated using the CKD-EPI Creatinine Equation (2021)    Anion gap 8 5 - 15    Comment: Performed at [facility], [address], [place], [place] [address]  CBC     Status: Abnormal   Collection Time: 04/13/22  4:41 AM  Result Value Ref Range   WBC 12.9 (H) 4.0 - 10.5 K/uL   RBC 5.42 4.22 - 5.81 MIL/uL   Hemoglobin 17.2 (H) 13.0 - 17.0 g/dL   HCT 26.3 33.5 - 45.6 %   MCV 89.1 80.0 - 100.0 fL   MCH 31.7 26.0 - 34.0 pg   MCHC 35.6 30.0 - 36.0 g/dL   RDW 25.6 38.9 - 37.3 %   Platelets 314 150 - 400 K/uL   nRBC 0.0 0.0 - 0.2 %    Comment: Performed at [facility], [address]  [place], [place] [address]  CBC     Status: Abnormal   Collection Time: 04/14/22  5:11 AM  Result Value Ref Range   WBC 13.0 (H) 4.0 - 10.5 K/uL   RBC 5.47 4.22 - 5.81 MIL/uL   Hemoglobin 17.1 (H) 13.0 - 17.0 g/dL   HCT 81.1 57.2 - 62.0 %   MCV 86.5 80.0 - 100.0 fL   MCH 31.3 26.0 - 34.0 pg  MCHC 36.2 (H) 30.0 - 36.0 g/dL   RDW 16.1 09.6 - 04.5 %   Platelets 333 150 - 400 K/uL   nRBC 0.0 0.0 - 0.2 %    Comment: Performed at [facility], [address], [place], [place] [address]  CK     Status: None   Collection Time: 04/14/22  5:11 AM  Result Value Ref Range   Total CK 66 49 - 397 U/L    Comment: Performed at [facility], [address] [place], [place] [address]  Comprehensive metabolic panel     Status: Abnormal   Collection Time: 04/14/22  5:11 AM  Result Value Ref Range   Sodium 135 135 - 145 mmol/L   Potassium 4.4 3.5 - 5.1 mmol/L   Chloride 97 (L) 98 - 111 mmol/L   CO2 29 22 - 32 mmol/L   Glucose, Bld 106 (H) 70 - 99 mg/dL    Comment: Glucose reference range applies only to samples taken after fasting for at least 8 hours.   BUN 11 6 - 20 mg/dL   Creatinine, Ser 8.29 0.61 - 1.24 mg/dL   Calcium 8.9 8.9 - 56.2 mg/dL   Total Protein 6.7 6.5 - 8.1 g/dL   Albumin 3.4 (L) 3.5 - 5.0 g/dL   AST 18 15 - 41 U/L   ALT 17 0 - 44 U/L   Alkaline Phosphatase 69 38 - 126 U/L   Total Bilirubin 1.1 0.3 - 1.2 mg/dL   GFR, Estimated >13 >08 mL/min    Comment: (NOTE) Calculated using the CKD-EPI Creatinine Equation (2021)    Anion gap 9 5 - 15    Comment: Performed at [facility], [address], [place], [place] [address]    Imaging / Studies: DG Abd Portable 1V  Result Date: 04/13/2022 CLINICAL DATA:  Generalized abdominal pain and distension EXAM: PORTABLE ABDOMEN - 1 VIEW COMPARISON:  CT abdomen 04/11/2022 FINDINGS: Gaseous distension of the small bowel and colon as can be seen with an ileus. No bowel dilatation to  suggest obstruction. No evidence of pneumoperitoneum, portal venous gas or pneumatosis. No pathologic calcifications along the expected course of the ureters. No acute osseous abnormality. IMPRESSION: 1. Gaseous distension of the small bowel and colon as can be seen with an ileus. Electronically Signed   By: Elige Ko M.D.   On: 04/13/2022 09:26    Medications / Allergies: per chart  Antibiotics: Anti-infectives (From admission, onward)    Start     Dose/Rate Route Frequency Ordered Stop   04/14/22 1800  piperacillin-tazobactam (ZOSYN) IVPB 3.375 g       Note to Pharmacy: Pharmacy may adjust dosing strength, schedule, rate of infusion, etc as needed to optimize therapy   3.375 g 12.5 mL/hr over 240 Minutes Intravenous Every 8 hours 04/14/22 1037     04/13/22 0900  piperacillin-tazobactam (ZOSYN) IVPB 3.375 g  Status:  Discontinued        3.375 g 12.5 mL/hr over 240 Minutes Intravenous Every 8 hours 04/13/22 0814 04/14/22 1037   04/11/22 2145  cefTRIAXone (ROCEPHIN) 2 g in sodium chloride 0.9 % 100 mL IVPB  Status:  Discontinued        2 g 200 mL/hr over 30 Minutes Intravenous Every 24 hours 04/11/22 2143 04/13/22 0814   04/11/22 2145  metroNIDAZOLE (FLAGYL) IVPB 500 mg  Status:  Discontinued        500 mg 100 mL/hr over 60 Minutes Intravenous Every 12  hours 04/11/22 2143 04/13/22 7616         Note: Portions of this report may have been transcribed using voice recognition software. Every effort was made to ensure accuracy; however, inadvertent computerized transcription errors may be present.   Any transcriptional errors that result from this process are unintentional.    Ardeth Sportsman, MD, FACS, MASCRS Esophageal, Gastrointestinal & Colorectal Surgery Robotic and Minimally Invasive Surgery  [facility], [facility]  [facility]  [address] [place], [place] [address] [phone] Fax [phone] Main  CONTACT  INFORMATION:  Weekday (9AM-5PM): Call [facility] main office at [phone]  Weeknight (5PM-9AM) or Weekend/Holiday: Check [email or web address] (password " [email or web address]") for General Surgery [facility] coverage  (Please, do not use SecureChat as it is not reliable communication to operating surgeons for immediate patient care)      04/14/2022  10:40 AM

## 2022-04-15 DIAGNOSIS — K5732 Diverticulitis of large intestine without perforation or abscess without bleeding: Secondary | ICD-10-CM | POA: Diagnosis not present

## 2022-04-15 DIAGNOSIS — M1A0720 Idiopathic chronic gout, left ankle and foot, without tophus (tophi): Secondary | ICD-10-CM | POA: Diagnosis not present

## 2022-04-15 DIAGNOSIS — I10 Essential (primary) hypertension: Secondary | ICD-10-CM | POA: Diagnosis not present

## 2022-04-15 LAB — CBC
HCT: 46.5 % (ref 39.0–52.0)
Hemoglobin: 16.6 g/dL (ref 13.0–17.0)
MCH: 31.3 pg (ref 26.0–34.0)
MCHC: 35.7 g/dL (ref 30.0–36.0)
MCV: 87.6 fL (ref 80.0–100.0)
Platelets: 332 10*3/uL (ref 150–400)
RBC: 5.31 MIL/uL (ref 4.22–5.81)
RDW: 12 % (ref 11.5–15.5)
WBC: 11.4 10*3/uL — ABNORMAL HIGH (ref 4.0–10.5)
nRBC: 0 % (ref 0.0–0.2)

## 2022-04-15 LAB — COMPREHENSIVE METABOLIC PANEL
ALT: 17 U/L (ref 0–44)
AST: 17 U/L (ref 15–41)
Albumin: 3.3 g/dL — ABNORMAL LOW (ref 3.5–5.0)
Alkaline Phosphatase: 61 U/L (ref 38–126)
Anion gap: 11 (ref 5–15)
BUN: 13 mg/dL (ref 6–20)
CO2: 26 mmol/L (ref 22–32)
Calcium: 8.9 mg/dL (ref 8.9–10.3)
Chloride: 98 mmol/L (ref 98–111)
Creatinine, Ser: 0.92 mg/dL (ref 0.61–1.24)
GFR, Estimated: >60 mL/min (ref >60)
Glucose, Bld: 96 mg/dL (ref 70–99)
Potassium: 4.1 mmol/L (ref 3.5–5.1)
Sodium: 135 mmol/L (ref 135–145)
Total Bilirubin: 0.9 mg/dL (ref 0.3–1.2)
Total Protein: 6.3 g/dL — ABNORMAL LOW (ref 6.5–8.1)

## 2022-04-15 MED ORDER — LOSARTAN POTASSIUM 50 MG PO TABS
50.0000 mg | ORAL_TABLET | Freq: Every day | ORAL | Status: DC
  Administered 2022-04-16 – 2022-04-22 (×5): 50 mg via ORAL
  Filled 2022-04-15 (×6): qty 1

## 2022-04-15 MED ORDER — AMLODIPINE BESYLATE 10 MG PO TABS
10.0000 mg | ORAL_TABLET | Freq: Every day | ORAL | Status: DC
  Administered 2022-04-15 – 2022-04-22 (×6): 10 mg via ORAL
  Filled 2022-04-15 (×6): qty 1

## 2022-04-15 NOTE — Progress Notes (Signed)
  Progress Note   Patient: Paul Gilmore RZN:[id number] DOB: 05-Apr-1976 DOA: 04/11/2022     4 DOS: the patient was seen and examined on 04/15/2022   Brief hospital course: 46 y.o. male with medical history significant for hypertension and recurrent diverticulitis, now presenting to the emergency department for evaluation of left lower quadrant abdominal pain.  The patient reports that he developed left lower quadrant abdominal pain on 04/09/2022, consistent with his prior experiences with sigmoid diverticulitis.  He has had nausea and 1 episode of vomiting near the time of symptom onset.  He had a normal bowel movement on 04/09/2022 and has continued to pass flatus.  Denies melena or hematochezia.   ED Course: Upon arrival to the ED, patient is found to be afebrile and saturating well on room air with elevated blood pressure.  CT of the abdomen and pelvis is notable for diffuse colonic distention with transition point in the mid sigmoid colon where there is wall thickening and surrounding small lymph nodes.  Surgery was consulted   Assessment and Plan: No notes have been filed under this hospital service. Service: Hospitalist 1. Acute diverticulitis  - Pt with hx of recurrent sigmoid diverticulitis p/w LLQ pain and found to have wall-thickening and inflammation with proximal distension raising question of developing SBO  - General Surgery following -Patient is continued on Zosyn -Patient reports less abdominal pain overnight.  Diet has since been advanced to clears per general surgery.   2. Hypertensive urgency  - Presenting BP 170-190/110-120 in ED  -cont with analgesia as needed -Cont PRN labetalol, have also ordered PRN hydralazine -We will transition patient to home Norvasc and home losartan -Avoiding scheduled IV beta blocker secondary to baseline bradycardia      General exam: Conversant, in no acute distress Respiratory system: normal chest rise, clear, no audible  wheezing Cardiovascular system: regular rhythm, s1-s2 Gastrointestinal system: Mildly distended, nontender, decreased BS Central nervous system: No seizures, no tremors Extremities: No cyanosis, no joint deformities Skin: No rashes, no pallor Psychiatry: Affect normal // no auditory hallucinations    Physical Exam: Vitals:   04/15/22 0106 04/15/22 0615 04/15/22 1136 04/15/22 1308  BP: (!) 157/97 (!) 196/110 (!) 185/125 (!) 166/98  Pulse: (!) 58 63 80 76  Resp:  18 15 15   Temp:  98.4 F (36.9 C) 98.4 F (36.9 C) 98.1 F (36.7 C)  TempSrc:  Oral Oral Oral  SpO2:  95% 95% 94%  Weight:      Height:        Data Reviewed:  Labs reviewed: Na 135, WBC 11.4   Family Communication: Pt in room, family not at bedside  Disposition: Status is: Inpatient Remains inpatient appropriate because: Severity of illness   Planned Discharge Destination: Home     Author: , MD 04/15/2022 4:36 PM  For on call review [email or web address].04/17/2022.

## 2022-04-16 ENCOUNTER — Inpatient Hospital Stay (HOSPITAL_COMMUNITY): Payer: Commercial Managed Care - PPO

## 2022-04-16 DIAGNOSIS — K5732 Diverticulitis of large intestine without perforation or abscess without bleeding: Secondary | ICD-10-CM | POA: Diagnosis not present

## 2022-04-16 DIAGNOSIS — I10 Essential (primary) hypertension: Secondary | ICD-10-CM | POA: Diagnosis not present

## 2022-04-16 DIAGNOSIS — I160 Hypertensive urgency: Secondary | ICD-10-CM | POA: Diagnosis not present

## 2022-04-16 DIAGNOSIS — M1A0720 Idiopathic chronic gout, left ankle and foot, without tophus (tophi): Secondary | ICD-10-CM | POA: Diagnosis not present

## 2022-04-16 LAB — CBC
HCT: 49.8 % (ref 39.0–52.0)
Hemoglobin: 17.6 g/dL — ABNORMAL HIGH (ref 13.0–17.0)
MCH: 31.3 pg (ref 26.0–34.0)
MCHC: 35.3 g/dL (ref 30.0–36.0)
MCV: 88.6 fL (ref 80.0–100.0)
Platelets: 312 10*3/uL (ref 150–400)
RBC: 5.62 MIL/uL (ref 4.22–5.81)
RDW: 12.1 % (ref 11.5–15.5)
WBC: 11.8 10*3/uL — ABNORMAL HIGH (ref 4.0–10.5)
nRBC: 0 % (ref 0.0–0.2)

## 2022-04-16 LAB — COMPREHENSIVE METABOLIC PANEL
ALT: 16 U/L (ref 0–44)
AST: 18 U/L (ref 15–41)
Albumin: 3.7 g/dL (ref 3.5–5.0)
Alkaline Phosphatase: 64 U/L (ref 38–126)
Anion gap: 10 (ref 5–15)
BUN: 11 mg/dL (ref 6–20)
CO2: 28 mmol/L (ref 22–32)
Calcium: 9 mg/dL (ref 8.9–10.3)
Chloride: 98 mmol/L (ref 98–111)
Creatinine, Ser: 0.95 mg/dL (ref 0.61–1.24)
GFR, Estimated: >60 mL/min (ref >60)
Glucose, Bld: 104 mg/dL — ABNORMAL HIGH (ref 70–99)
Potassium: 4.7 mmol/L (ref 3.5–5.1)
Sodium: 136 mmol/L (ref 135–145)
Total Bilirubin: 1 mg/dL (ref 0.3–1.2)
Total Protein: 6.8 g/dL (ref 6.5–8.1)

## 2022-04-16 MED ORDER — IOHEXOL 9 MG/ML PO SOLN
500.0000 mL | ORAL | Status: AC
  Administered 2022-04-16 (×2): 500 mL via ORAL

## 2022-04-16 MED ORDER — IOHEXOL 9 MG/ML PO SOLN
ORAL | Status: AC
  Filled 2022-04-16: qty 1000

## 2022-04-16 MED ORDER — HYDRALAZINE HCL 25 MG PO TABS
25.0000 mg | ORAL_TABLET | Freq: Three times a day (TID) | ORAL | Status: DC
Start: 2022-04-16 — End: 2022-04-22
  Administered 2022-04-16 – 2022-04-22 (×11): 25 mg via ORAL
  Filled 2022-04-16 (×13): qty 1

## 2022-04-16 MED ORDER — SODIUM CHLORIDE (PF) 0.9 % IJ SOLN
INTRAMUSCULAR | Status: AC
  Filled 2022-04-16: qty 50

## 2022-04-16 MED ORDER — IOHEXOL 300 MG/ML  SOLN
100.0000 mL | Freq: Once | INTRAMUSCULAR | Status: AC | PRN
  Administered 2022-04-16: 100 mL via INTRAVENOUS

## 2022-04-16 NOTE — Progress Notes (Signed)
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  [facility] Stay = 5 days)  Assessment/Plan - afebrile, VSS - partial colon obstruction from his recurrent sigmoid diverticulitis.  He had evidence of narrowing 4 years ago on colonoscopy. -Antibiotics: Rocephin/Flagyl 5/17-5/19 switched to Zosyn 5/19-.  Continue antibiotics for now  -He is going to need a colectomy.   This has been discussed with him by our group numerous times in the past several years.  I think he understands and relents to consider that ("whatever you say doc, y'all are the experts").  Hopefully we can calm this down & he will open up enough to have MIS lap/robotic rectosigmoid resection with immediate anastomosis next month.    I am skeptical that flex sig and stent placement in the middle of attack would be wise and GI would hesitate.  CT scan today to assess inflammation, rule out abscess, assess obstruction.   If improved reassuring with no  obstruction, see if can transition to oral diet (possibly.  Liquid/pureed) and oral antibiotics with colectomy next month to minimize the risk of need for colostomy or open incision.  If there is an abscess, see if it is amenable to drainage or antibiotic adjustment.  If he cannot tolerate oral contrast or CAT scan shows worsening obstruction inflammation, would plan Hartmann resection with temporary ostomy to get him over this attack and colostomy takedown 3-6 months later.    Can try minimally invasive approach but with his distention it may not be possible.  We will see.    He would like to try and see if we can avoid a temporary bag.  Long discussion with the patient and his spouse at bedside.  Questions answered.  Expressed understanding appreciation.  -Gout, hypertension, other medical issues per primary service -VTE prophylaxis- SCDs, etc -mobilize as tolerated to help recovery  Disposition: TBD -suspected will be late May   I updated the patient's status to the patient and spouse  Recommendations were made.  Questions were answered.  They expressed understanding & appreciation.    I reviewed hospitalist notes, last 24 h vitals and pain scores, last 48 h intake and output, last 24 h labs and trends, and last 24 h imaging results. I have reviewed this patient's available data, including medical history, events of note, test results, etc as part of my evaluation.  A significant portion of that time was spent in counseling.  Care during the described time interval was provided by me.  This care required moderate level of medical decision making.  04/16/2022    Subjective: (Chief complaint)  Patient feeling less bloated.  Less abdominal pain.  Passing more gas.  Drinking CT contrast.  Has gotten through one half bottles without difficulty.  Wife in room  Objective:  Vital signs:  Vitals:   04/15/22 1308 04/15/22 1815 04/15/22 2150 04/16/22 0544  BP: (!) 166/98 (!)  185/106 (!) 159/117 (!) 159/110  Pulse: 76  86 68  Resp: 15  12 10   Temp: 98.1 F (36.7 C)  98.4 F (36.9 C) 97.9 F (36.6 C)  TempSrc: Oral  Oral Oral  SpO2: 94%  96% 96%  Weight:      Height:        Last BM Date : 04/09/22  Intake/Output   Yesterday:  05/21 0701 - 05/22 0700 In: 1543.1 [P.O.:360; I.V.:933.1; IV Piggyback:250] Out: 2100 [Urine:2100] This shift:  No intake/output data recorded.  Bowel function:  Flatus: YES  BM:  No  Drain: (No drain)   Physical Exam:  General: Pt awake/alert in no acute distress Eyes: PERRL, normal EOM.  Sclera clear.  No icterus Neuro: CN II-XII intact w/o focal sensory/motor deficits. Lymph: No head/neck/groin lymphadenopathy Psych:  No delerium/psychosis/paranoia.  Oriented x 4 HENT: Normocephalic, Mucus membranes moist.  No thrush Neck: Supple, No tracheal deviation.  No obvious thyromegaly Chest: No pain to chest wall compression.  Good respiratory excursion.  No audible wheezing CV:  Pulses intact.  Regular rhythm.  No major extremity edema MS: Normal AROM mjr joints.  No obvious deformity  Abdomen: Morbidly Obese but Soft.  Mildy distended.  Nontender.  No evidence of peritonitis.  No incarcerated hernias.  Ext:   No deformity.  No mjr edema.  No cyanosis Skin: No petechiae / purpurea.  No major sores.  Warm and dry    Results:   Cultures: Recent Results (from the past 720 hour(s))  Resp Panel by RT-PCR (Flu A&B, Covid) Nasopharyngeal Swab     Status: None   Collection Time: 04/11/22  9:31 PM   Specimen: Nasopharyngeal Swab; Nasopharyngeal(NP) swabs in vial transport medium  Result Value Ref Range Status   SARS Coronavirus 2 by RT PCR NEGATIVE NEGATIVE Final    Comment: (NOTE) SARS-CoV-2 target nucleic acids are NOT DETECTED.  The SARS-CoV-2 RNA is generally detectable in upper respiratory specimens during the acute phase of infection. The lowest concentration of SARS-CoV-2 viral copies this assay can detect  is 138 copies/mL. A negative result does not preclude SARS-Cov-2 infection and should not be used as the sole basis for treatment or other patient management decisions. A negative result may occur with  improper specimen collection/handling, submission of specimen other than nasopharyngeal swab, presence of viral mutation(s) within the areas targeted by this assay, and inadequate number of viral copies(<138 copies/mL). A negative result must be combined with clinical observations, patient history, and epidemiological information. The expected result is Negative.  Fact Sheet for Patients:  EntrepreneurPulse.com.au  Fact Sheet for Healthcare Providers:  IncredibleEmployment.be  This test is no t yet approved or cleared by the [place] FDA and  has been authorized for detection and/or diagnosis of SARS-CoV-2 by FDA under an Emergency Use Authorization (EUA). This EUA will remain  in effect (meaning this test can be used) for the duration of the COVID-19 declaration under Section 564(b)(1) of the Act, 21 U.S.C.section 360bbb-3(b)(1), unless the authorization is terminated  or revoked sooner.       Influenza A by PCR NEGATIVE NEGATIVE Final   Influenza B by PCR NEGATIVE NEGATIVE Final    Comment: (NOTE)  The Xpert Xpress SARS-CoV-2/FLU/RSV plus assay is intended as an aid in the diagnosis of influenza from Nasopharyngeal swab specimens and should not be used as a sole basis for treatment. Nasal washings and aspirates are unacceptable for Xpert Xpress SARS-CoV-2/FLU/RSV testing.  Fact Sheet for Patients: EntrepreneurPulse.com.au  Fact Sheet for Healthcare Providers: IncredibleEmployment.be  This test is not yet approved or cleared by the [place] FDA and has been authorized for detection and/or diagnosis of SARS-CoV-2 by FDA under an Emergency Use Authorization (EUA). This EUA will remain in effect  (meaning this test can be used) for the duration of the COVID-19 declaration under Section 564(b)(1) of the Act, 21 U.S.C. section 360bbb-3(b)(1), unless the authorization is terminated or revoked.  Performed at [facility], [place] [address], [place], [place] [address]     Labs: Results for orders placed or performed during the hospital encounter of 04/11/22 (from the past 48 hour(s))  Comprehensive metabolic panel     Status: Abnormal   Collection Time: 04/15/22  3:49 AM  Result Value Ref Range   Sodium 135 135 - 145 mmol/L   Potassium 4.1 3.5 - 5.1 mmol/L   Chloride 98 98 - 111 mmol/L   CO2 26 22 - 32 mmol/L   Glucose, Bld 96 70 - 99 mg/dL    Comment: Glucose reference range applies only to samples taken after fasting for at least 8 hours.   BUN 13 6 - 20 mg/dL   Creatinine, Ser 0.92 0.61 - 1.24 mg/dL   Calcium 8.9 8.9 - 10.3 mg/dL   Total Protein 6.3 (L) 6.5 - 8.1 g/dL   Albumin 3.3 (L) 3.5 - 5.0 g/dL   AST 17 15 - 41 U/L   ALT 17 0 - 44 U/L   Alkaline Phosphatase 61 38 - 126 U/L   Total Bilirubin 0.9 0.3 - 1.2 mg/dL   GFR, Estimated >60 >60 mL/min    Comment: (NOTE) Calculated using the CKD-EPI Creatinine Equation (2021)    Anion gap 11 5 - 15    Comment: Performed at [facility], [place] [address] [place], [place] [address]  CBC     Status: Abnormal   Collection Time: 04/15/22  3:49 AM  Result Value Ref Range   WBC 11.4 (H) 4.0 - 10.5 K/uL   RBC 5.31 4.22 - 5.81 MIL/uL   Hemoglobin 16.6 13.0 - 17.0 g/dL   HCT 46.5 39.0 - 52.0 %   MCV 87.6 80.0 - 100.0 fL   MCH 31.3 26.0 - 34.0 pg   MCHC 35.7 30.0 - 36.0 g/dL   RDW 12.0 11.5 - 15.5 %   Platelets 332 150 - 400 K/uL   nRBC 0.0 0.0 - 0.2 %    Comment: Performed at [facility], [place] [address], [place], [place] [address]  Comprehensive metabolic panel     Status: Abnormal   Collection Time: 04/16/22  4:13 AM  Result Value Ref Range   Sodium 136 135 - 145  mmol/L   Potassium 4.7 3.5 - 5.1 mmol/L   Chloride 98 98 - 111 mmol/L   CO2 28 22 - 32 mmol/L   Glucose, Bld 104 (H) 70 - 99 mg/dL    Comment: Glucose reference range applies only to samples taken after fasting for at least 8 hours.   BUN 11 6 - 20 mg/dL   Creatinine, Ser 0.95 0.61 - 1.24 mg/dL   Calcium 9.0 8.9 - 10.3 mg/dL   Total Protein 6.8  6.5 - 8.1 g/dL   Albumin 3.7 3.5 - 5.0 g/dL   AST 18 15 - 41 U/L   ALT 16 0 - 44 U/L   Alkaline Phosphatase 64 38 - 126 U/L   Total Bilirubin 1.0 0.3 - 1.2 mg/dL   GFR, Estimated >60 >60 mL/min    Comment: (NOTE) Calculated using the CKD-EPI Creatinine Equation (2021)    Anion gap 10 5 - 15    Comment: Performed at [facility], [place] [address] [place], [place] [address]  CBC     Status: Abnormal   Collection Time: 04/16/22  4:13 AM  Result Value Ref Range   WBC 11.8 (H) 4.0 - 10.5 K/uL   RBC 5.62 4.22 - 5.81 MIL/uL   Hemoglobin 17.6 (H) 13.0 - 17.0 g/dL   HCT 49.8 39.0 - 52.0 %   MCV 88.6 80.0 - 100.0 fL   MCH 31.3 26.0 - 34.0 pg   MCHC 35.3 30.0 - 36.0 g/dL   RDW 12.1 11.5 - 15.5 %   Platelets 312 150 - 400 K/uL   nRBC 0.0 0.0 - 0.2 %    Comment: Performed at [facility], [place] [address], [place], [place] [address]    Imaging / Studies: No results found.  Medications / Allergies: per chart  Antibiotics: Anti-infectives (From admission, onward)    Start     Dose/Rate Route Frequency Ordered Stop   04/14/22 1800  piperacillin-tazobactam (ZOSYN) IVPB 3.375 g       Note to Pharmacy: Pharmacy may adjust dosing strength, schedule, rate of infusion, etc as needed to optimize therapy   3.375 g 12.5 mL/hr over 240 Minutes Intravenous Every 8 hours 04/14/22 1037     04/13/22 0900  piperacillin-tazobactam (ZOSYN) IVPB 3.375 g  Status:  Discontinued        3.375 g 12.5 mL/hr over 240 Minutes Intravenous Every 8 hours 04/13/22 0814 04/14/22 1037   04/11/22 2145  cefTRIAXone (ROCEPHIN) 2 g in  sodium chloride 0.9 % 100 mL IVPB  Status:  Discontinued        2 g 200 mL/hr over 30 Minutes Intravenous Every 24 hours 04/11/22 2143 04/13/22 0814   04/11/22 2145  metroNIDAZOLE (FLAGYL) IVPB 500 mg  Status:  Discontinued        500 mg 100 mL/hr over 60 Minutes Intravenous Every 12 hours 04/11/22 2143 04/13/22 [id number]         Note: Portions of this report may have been transcribed using voice recognition software. Every effort was made to ensure accuracy; however, inadvertent computerized transcription errors may be present.   Any transcriptional errors that result from this process are unintentional.    Adin Hector, MD, FACS, MASCRS Esophageal, Gastrointestinal & Colorectal Surgery Robotic and Minimally Invasive Surgery  [facility], [place]  [place]. [address] [place],  [address] [phone] Fax [phone] Main  CONTACT INFORMATION:  Weekday (9AM-5PM): Call [facility] main office at [phone]  Weeknight (5PM-9AM) or Weekend/Holiday: Check [email or web address] (password " [email or web address]") for General Surgery [facility] coverage  (Please, do not use SecureChat as it is not reliable communication to operating surgeons for immediate patient care)      04/16/2022  7:57 AM

## 2022-04-16 NOTE — Progress Notes (Signed)
  Progress Note   Patient: Paul Gilmore VQM:[id number] DOB: Aug 01, 1976 DOA: 04/11/2022     5 DOS: the patient was seen and examined on 04/16/2022   Brief hospital course: 46 y.o. male with medical history significant for hypertension and recurrent diverticulitis, now presenting to the emergency department for evaluation of left lower quadrant abdominal pain.  The patient reports that he developed left lower quadrant abdominal pain on 04/09/2022, consistent with his prior experiences with sigmoid diverticulitis.  He has had nausea and 1 episode of vomiting near the time of symptom onset.  He had a normal bowel movement on 04/09/2022 and has continued to pass flatus.  Denies melena or hematochezia.   ED Course: Upon arrival to the ED, patient is found to be afebrile and saturating well on room air with elevated blood pressure.  CT of the abdomen and pelvis is notable for diffuse colonic distention with transition point in the mid sigmoid colon where there is wall thickening and surrounding small lymph nodes.  Surgery was consulted   Assessment and Plan: No notes have been filed under this hospital service. Service: Hospitalist 1. Acute diverticulitis  - Pt with hx of recurrent sigmoid diverticulitis p/w LLQ pain and found to have wall-thickening and inflammation with proximal distension raising question of developing SBO  - General Surgery following -Patient is continued on Zosyn -Patient still has abd discomfort but states it is somewhat better.  -Per general surgery, plan CT abd today. If abscess seen, then plan to drain. If improving, then possible advance diet. If worsening, then possible Hartmann resection with temporary ostomy. Pending CT results   2. Hypertensive urgency  - Presenting BP 170-190/110-120 in ED  -cont with analgesia as needed -Cont PRN labetalol, have also ordered PRN hydralazine -Transitioned patient to home Norvasc and home losartan -BP remains poorly controlled. Have added  scheduled PO hydralazine     General exam: Awake, laying in bed, in nad Respiratory system: Normal respiratory effort, no wheezing Cardiovascular system: regular rate, s1, s2 Gastrointestinal system: Soft, nondistended, positive BS Central nervous system: CN2-12 grossly intact, strength intact Extremities: Perfused, no clubbing Skin: Normal skin turgor, no notable skin lesions seen Psychiatry: Mood normal // no visual hallucinations   Physical Exam: Vitals:   04/15/22 1815 04/15/22 2150 04/16/22 0544 04/16/22 1344  BP: (!) 185/106 (!) 159/117 (!) 159/110 (!) 191/100  Pulse:  86 68 77  Resp:  12 10 20   Temp:  98.4 F (36.9 C) 97.9 F (36.6 C)   TempSrc:  Oral Oral   SpO2:  96% 96% 95%  Weight:      Height:        Data Reviewed:  Labs reviewed: Na 136, WBC 11.8   Family Communication: Pt in room, family not at bedside  Disposition: Status is: Inpatient Remains inpatient appropriate because: Severity of illness   Planned Discharge Destination: Home     Author: , MD 04/16/2022 3:07 PM  For on call review [email or web address].

## 2022-04-16 NOTE — Progress Notes (Signed)
Paul Gilmore [id number] 12/25/1975  CARE TEAM:  PCP: Myra RudeSchmitz, Jeremy E, MD  Outpatient Care Team: Patient Care Team: Myra RudeSchmitz, Jeremy E, MD as PCP - General (Family Medicine) Armbruster, Willaim RayasSteven P, MD as Consulting Physician (Gastroenterology)  Inpatient Treatment Team: Treatment Team: Attending Provider: Jerald Kiefhiu, Stephen K, MD; Consulting Physician: Montez Moritacs, Md, MD; Rounding Team: Lilyan Gilfordriadhosp, Wl6, MD; Registered Nurse: Riki SheerFrady, Hannah L, RN; Technician: Vella RaringBryant-Briggs, Belinda, NT; Utilization Review: Bedingfield, Garey HamAshley A, RN; Pharmacist: Adalberto ColeGlogovac, Nikola, [facility] CampusRPH   Problem List:   Principal Problem:   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  [facility](Hospital Stay = 5 days)  Assessment/Plan  Sigmoid diverticulitis - afebrile, VSS - partial colon obstruction from his recurrent sigmoid diverticulitis.  He had evidence of narrowing 4 years ago on colonoscopy. -With less pain/distention and better flatus, will try liquid diet and see how things go. -Some signs of flatus a guardedly hopeful sign but still very distended -Antibiotics: Rocephin/Flagyl 5/17-5/19 switched to Zosyn 5/19-.  I would give pip/tazo antibiotics 48 hours to see if he can get this infection control and open things up.  -He is going to need a colectomy.   This has been discussed with him by our group numerous times in the past several years.  I think he understands and relents to consider that ("whatever you say doc, y'all are the experts").  Hopefully we can calm this down & he will open up enough to have MIS lap/robotic rectosigmoid resection with  immediate anastomosis next month.    I am skeptical that flex sig and stent placement in the middle of attack would be wise and GI would hesitate.  If stable to improving, plan CAT scan tomorrow to assess inflammation, rule out abscess, assess obstruction.  If improved reassuring with no obstruction, see if can transition to oral diet and oral antibiotics with colectomy next month to minimize the risk of need for colostomy or open incision.  If he does not improve by Monday, would plan Hartmann resection with temporary ostomy to get him over this attack and colostomy takedown 3-6 months later.    Can try minimally invasive approach but with his distention it may not be possible.  We will see.    He would like to try and see if we can avoid a temporary bag.  Long discussion with the patient and his spouse at bedside.  Questions answered.  Expressed understanding appreciation.  -Gout, hypertension, other medical issues per primary service -VTE prophylaxis- SCDs, etc -mobilize as tolerated to help recovery  Disposition: TBD -suspected will be late May      I reviewed hospitalist notes, last 24 h vitals and pain scores, last 48 h intake and output, last 24 h labs and trends, and last 24 h imaging results. I have reviewed this patient's available data, including medical history, events of note, test results, etc as part of my evaluation.  A significant portion of that time was spent in counseling.  Care during the described time interval was provided by me.  This care required moderate level of medical decision making.  04/16/2022    Subjective: (Chief complaint)  Patient feeling less bloated.  Less abdominal pain.  Passing more gas.  Nausea less  Objective:  Vital signs:  Vitals:   04/15/22 1308 04/15/22 1815 04/15/22 2150 04/16/22 0544  BP: (!) 166/98 (!) 185/106 (!) 159/117 (!) 159/110  Pulse: 76  86 68  Resp: 15  12 10   Temp: 98.1 F (36.7 C)  98.4 F (36.9 C) 97.9 F  (36.6 C)  TempSrc: Oral  Oral Oral  SpO2: 94%  96% 96%  Weight:      Height:        Last BM Date : 04/09/22  Intake/Output   Yesterday:  05/21 0701 - 05/22 0700 In: 1543.1 [P.O.:360; I.V.:933.1; IV Piggyback:250] Out: 2100 [Urine:2100] This shift:  No intake/output data recorded.  Bowel function:  Flatus: YES  BM:  No  Drain: (No drain)   Physical Exam:  General: Pt awake/alert in no acute distress Eyes: PERRL, normal EOM.  Sclera clear.  No icterus Neuro: CN II-XII intact w/o focal sensory/motor deficits. Lymph: No head/neck/groin lymphadenopathy Psych:  No delerium/psychosis/paranoia.  Oriented x 4 HENT: Normocephalic, Mucus membranes moist.  No thrush Neck: Supple, No tracheal deviation.  No obvious thyromegaly Chest: No pain to chest wall compression.  Good respiratory excursion.  No audible wheezing CV:  Pulses intact.  Regular rhythm.  No major extremity edema MS: Normal AROM mjr joints.  No obvious deformity  Abdomen: Obese but Soft.  Moderately distended.  Tenderness at LLQ mild -no rebound tenderness nor guarding .  No evidence of peritonitis.  No incarcerated hernias.  Ext:   No deformity.  No mjr edema.  No cyanosis Skin: No petechiae / purpurea.  No major sores.  Warm and dry    Results:   Cultures: Recent Results (from the past 720 hour(s))  Resp Panel by RT-PCR (Flu A&B, Covid) Nasopharyngeal Swab     Status: None   Collection Time: 04/11/22  9:31 PM   Specimen: Nasopharyngeal Swab; Nasopharyngeal(NP) swabs in vial transport medium  Result Value Ref Range Status   SARS Coronavirus 2 by RT PCR NEGATIVE NEGATIVE Final    Comment: (NOTE) SARS-CoV-2 target nucleic acids are NOT DETECTED.  The SARS-CoV-2 RNA is generally detectable in upper respiratory specimens during the acute phase of infection. The lowest concentration of SARS-CoV-2 viral copies this assay can detect is 138 copies/mL. A negative result does not preclude SARS-Cov-2 infection  and should not be used as the sole basis for treatment or other patient management decisions. A negative result may occur with  improper specimen collection/handling, submission of specimen other than nasopharyngeal swab, presence of viral mutation(s) within the areas targeted by this assay, and inadequate number of viral copies(<138 copies/mL). A negative result must be combined with clinical observations, patient history, and epidemiological information. The expected result is Negative.  Fact Sheet for Patients:  04/13/22  Fact Sheet for Healthcare Providers:  BloggerCourse.com  This test is no t yet approved or cleared by the [place] FDA and  has been authorized for detection and/or diagnosis of SARS-CoV-2 by FDA under an Emergency Use Authorization (EUA). This EUA will remain  in effect (meaning this test can be used) for the duration of the COVID-19 declaration under Section 564(b)(1) of the Act, 21 U.S.C.section 360bbb-3(b)(1), unless the authorization is terminated  or revoked sooner.       Influenza A by PCR NEGATIVE NEGATIVE Final   Influenza B by PCR NEGATIVE NEGATIVE Final    Comment: (NOTE) The Xpert Xpress SARS-CoV-2/FLU/RSV plus assay  is intended as an aid in the diagnosis of influenza from Nasopharyngeal swab specimens and should not be used as a sole basis for treatment. Nasal washings and aspirates are unacceptable for Xpert Xpress SARS-CoV-2/FLU/RSV testing.  Fact Sheet for Patients: BloggerCourse.com  Fact Sheet for Healthcare Providers: SeriousBroker.it  This test is not yet approved or cleared by the [place] FDA and has been authorized for detection and/or diagnosis of SARS-CoV-2 by FDA under an Emergency Use Authorization (EUA). This EUA will remain in effect (meaning this test can be used) for the duration of the COVID-19 declaration  under Section 564(b)(1) of the Act, 21 U.S.C. section 360bbb-3(b)(1), unless the authorization is terminated or revoked.  Performed at [facility], [address] [place], [place] [address]     Labs: Results for orders placed or performed during the hospital encounter of 04/11/22 (from the past 48 hour(s))  Comprehensive metabolic panel     Status: Abnormal   Collection Time: 04/15/22  3:49 AM  Result Value Ref Range   Sodium 135 135 - 145 mmol/L   Potassium 4.1 3.5 - 5.1 mmol/L   Chloride 98 98 - 111 mmol/L   CO2 26 22 - 32 mmol/L   Glucose, Bld 96 70 - 99 mg/dL    Comment: Glucose reference range applies only to samples taken after fasting for at least 8 hours.   BUN 13 6 - 20 mg/dL   Creatinine, Ser 2.53 0.61 - 1.24 mg/dL   Calcium 8.9 8.9 - 66.4 mg/dL   Total Protein 6.3 (L) 6.5 - 8.1 g/dL   Albumin 3.3 (L) 3.5 - 5.0 g/dL   AST 17 15 - 41 U/L   ALT 17 0 - 44 U/L   Alkaline Phosphatase 61 38 - 126 U/L   Total Bilirubin 0.9 0.3 - 1.2 mg/dL   GFR, Estimated >40 >34 mL/min    Comment: (NOTE) Calculated using the CKD-EPI Creatinine Equation (2021)    Anion gap 11 5 - 15    Comment: Performed at [facility], [address] [place], [place] [address]  CBC     Status: Abnormal   Collection Time: 04/15/22  3:49 AM  Result Value Ref Range   WBC 11.4 (H) 4.0 - 10.5 K/uL   RBC 5.31 4.22 - 5.81 MIL/uL   Hemoglobin 16.6 13.0 - 17.0 g/dL   HCT 56.3 87.5 - 64.3 %   MCV 87.6 80.0 - 100.0 fL   MCH 31.3 26.0 - 34.0 pg   MCHC 35.7 30.0 - 36.0 g/dL   RDW 32.9 51.8 - 84.1 %   Platelets 332 150 - 400 K/uL   nRBC 0.0 0.0 - 0.2 %    Comment: Performed at [facility], [address], [place], [place] [address]  Comprehensive metabolic panel     Status: Abnormal   Collection Time: 04/16/22  4:13 AM  Result Value Ref Range   Sodium 136 135 - 145 mmol/L   Potassium 4.7 3.5 - 5.1 mmol/L   Chloride 98 98 - 111 mmol/L   CO2 28  22 - 32 mmol/L   Glucose, Bld 104 (H) 70 - 99 mg/dL    Comment: Glucose reference range applies only to samples taken after fasting for at least 8 hours.   BUN 11 6 - 20 mg/dL   Creatinine, Ser 0.16 0.61 - 1.24 mg/dL   Calcium 9.0 8.9 - 01.0 mg/dL   Total Protein 6.8 6.5 - 8.1 g/dL  Albumin 3.7 3.5 - 5.0 g/dL   AST 18 15 - 41 U/L   ALT 16 0 - 44 U/L   Alkaline Phosphatase 64 38 - 126 U/L   Total Bilirubin 1.0 0.3 - 1.2 mg/dL   GFR, Estimated >13 >24 mL/min    Comment: (NOTE) Calculated using the CKD-EPI Creatinine Equation (2021)    Anion gap 10 5 - 15    Comment: Performed at [facility], [address], [place], [place] [address]  CBC     Status: Abnormal   Collection Time: 04/16/22  4:13 AM  Result Value Ref Range   WBC 11.8 (H) 4.0 - 10.5 K/uL   RBC 5.62 4.22 - 5.81 MIL/uL   Hemoglobin 17.6 (H) 13.0 - 17.0 g/dL   HCT 72.5 36.6 - 44.0 %   MCV 88.6 80.0 - 100.0 fL   MCH 31.3 26.0 - 34.0 pg   MCHC 35.3 30.0 - 36.0 g/dL   RDW 34.7 42.5 - 95.6 %   Platelets 312 150 - 400 K/uL   nRBC 0.0 0.0 - 0.2 %    Comment: Performed at [facility], [address] [place], [place] [address]    Imaging / Studies: No results found.  Medications / Allergies: per chart  Antibiotics: Anti-infectives (From admission, onward)    Start     Dose/Rate Route Frequency Ordered Stop   04/14/22 1800  piperacillin-tazobactam (ZOSYN) IVPB 3.375 g       Note to Pharmacy: Pharmacy may adjust dosing strength, schedule, rate of infusion, etc as needed to optimize therapy   3.375 g 12.5 mL/hr over 240 Minutes Intravenous Every 8 hours 04/14/22 1037     04/13/22 0900  piperacillin-tazobactam (ZOSYN) IVPB 3.375 g  Status:  Discontinued        3.375 g 12.5 mL/hr over 240 Minutes Intravenous Every 8 hours 04/13/22 0814 04/14/22 1037   04/11/22 2145  cefTRIAXone (ROCEPHIN) 2 g in sodium chloride 0.9 % 100 mL IVPB  Status:  Discontinued        2 g 200 mL/hr over 30  Minutes Intravenous Every 24 hours 04/11/22 2143 04/13/22 0814   04/11/22 2145  metroNIDAZOLE (FLAGYL) IVPB 500 mg  Status:  Discontinued        500 mg 100 mL/hr over 60 Minutes Intravenous Every 12 hours 04/11/22 2143 04/13/22 4332         Note: Portions of this report may have been transcribed using voice recognition software. Every effort was made to ensure accuracy; however, inadvertent computerized transcription errors may be present.   Any transcriptional errors that result from this process are unintentional.    Ardeth Sportsman, MD, FACS, MASCRS Esophageal, Gastrointestinal & Colorectal Surgery Robotic and Minimally Invasive Surgery  [facility], [facility]  [facility]  [address], [address] [place], [place] [address] [phone] Fax [phone] Main  CONTACT INFORMATION:  Weekday (9AM-5PM): Call [facility] main office at [phone]  Weeknight (5PM-9AM) or Weekend/Holiday: Check [email or web address] (password " [email or web address]") for General Surgery [facility] coverage  (Please, do not use SecureChat as it is not reliable communication to operating surgeons for immediate patient care)      04/16/2022  7:54 AM

## 2022-04-17 ENCOUNTER — Encounter (HOSPITAL_COMMUNITY): Payer: Self-pay | Admitting: Family Medicine

## 2022-04-17 ENCOUNTER — Inpatient Hospital Stay (HOSPITAL_COMMUNITY): Payer: Commercial Managed Care - PPO

## 2022-04-17 ENCOUNTER — Encounter (HOSPITAL_COMMUNITY)
Admission: EM | Disposition: A | Discharge status: Home Health | Payer: Self-pay | Source: Home / Self Care | Attending: Internal Medicine

## 2022-04-17 ENCOUNTER — Inpatient Hospital Stay (HOSPITAL_COMMUNITY): Payer: Commercial Managed Care - PPO | Admitting: Certified Registered Nurse Anesthetist

## 2022-04-17 DIAGNOSIS — M1A0720 Idiopathic chronic gout, left ankle and foot, without tophus (tophi): Secondary | ICD-10-CM | POA: Diagnosis not present

## 2022-04-17 DIAGNOSIS — M1990 Unspecified osteoarthritis, unspecified site: Secondary | ICD-10-CM

## 2022-04-17 DIAGNOSIS — I10 Essential (primary) hypertension: Secondary | ICD-10-CM

## 2022-04-17 DIAGNOSIS — K56609 Unspecified intestinal obstruction, unspecified as to partial versus complete obstruction: Secondary | ICD-10-CM

## 2022-04-17 DIAGNOSIS — K5732 Diverticulitis of large intestine without perforation or abscess without bleeding: Secondary | ICD-10-CM | POA: Diagnosis not present

## 2022-04-17 DIAGNOSIS — K5792 Diverticulitis of intestine, part unspecified, without perforation or abscess without bleeding: Secondary | ICD-10-CM

## 2022-04-17 LAB — CBC
HCT: 47.6 % (ref 39.0–52.0)
Hemoglobin: 16.8 g/dL (ref 13.0–17.0)
MCH: 31.9 pg (ref 26.0–34.0)
MCHC: 35.3 g/dL (ref 30.0–36.0)
MCV: 90.3 fL (ref 80.0–100.0)
Platelets: 335 10*3/uL (ref 150–400)
RBC: 5.27 MIL/uL (ref 4.22–5.81)
RDW: 12.2 % (ref 11.5–15.5)
WBC: 11.2 10*3/uL — ABNORMAL HIGH (ref 4.0–10.5)
nRBC: 0 % (ref 0.0–0.2)

## 2022-04-17 LAB — COMPREHENSIVE METABOLIC PANEL
ALT: 20 U/L (ref 0–44)
AST: 17 U/L (ref 15–41)
Albumin: 3.4 g/dL — ABNORMAL LOW (ref 3.5–5.0)
Alkaline Phosphatase: 62 U/L (ref 38–126)
Anion gap: 11 (ref 5–15)
BUN: 11 mg/dL (ref 6–20)
CO2: 25 mmol/L (ref 22–32)
Calcium: 9 mg/dL (ref 8.9–10.3)
Chloride: 98 mmol/L (ref 98–111)
Creatinine, Ser: 0.93 mg/dL (ref 0.61–1.24)
GFR, Estimated: >60 mL/min (ref >60)
Glucose, Bld: 103 mg/dL — ABNORMAL HIGH (ref 70–99)
Potassium: 4.2 mmol/L (ref 3.5–5.1)
Sodium: 134 mmol/L — ABNORMAL LOW (ref 135–145)
Total Bilirubin: 1.1 mg/dL (ref 0.3–1.2)
Total Protein: 6.6 g/dL (ref 6.5–8.1)

## 2022-04-17 SURGERY — COLECTOMY, SIGMOID, ROBOT-ASSISTED
Anesthesia: General

## 2022-04-17 MED ORDER — GABAPENTIN 300 MG PO CAPS
300.0000 mg | ORAL_CAPSULE | ORAL | Status: AC
  Administered 2022-04-17: 300 mg via ORAL
  Filled 2022-04-17: qty 1

## 2022-04-17 MED ORDER — MIDAZOLAM HCL 5 MG/5ML IJ SOLN
INTRAMUSCULAR | Status: DC | PRN
  Administered 2022-04-17: 2 mg via INTRAVENOUS

## 2022-04-17 MED ORDER — CELECOXIB 200 MG PO CAPS
200.0000 mg | ORAL_CAPSULE | ORAL | Status: AC
  Administered 2022-04-17: 200 mg via ORAL
  Filled 2022-04-17: qty 1

## 2022-04-17 MED ORDER — KETAMINE HCL 50 MG/5ML IJ SOSY
PREFILLED_SYRINGE | INTRAMUSCULAR | Status: AC
  Filled 2022-04-17: qty 5

## 2022-04-17 MED ORDER — FENTANYL CITRATE PF 50 MCG/ML IJ SOSY
PREFILLED_SYRINGE | INTRAMUSCULAR | Status: AC
  Filled 2022-04-17: qty 3

## 2022-04-17 MED ORDER — FENTANYL CITRATE (PF) 250 MCG/5ML IJ SOLN
INTRAMUSCULAR | Status: AC
  Filled 2022-04-17: qty 5

## 2022-04-17 MED ORDER — FENTANYL CITRATE PF 50 MCG/ML IJ SOSY
25.0000 ug | PREFILLED_SYRINGE | INTRAMUSCULAR | Status: DC | PRN
  Administered 2022-04-17 (×3): 50 ug via INTRAVENOUS

## 2022-04-17 MED ORDER — HYDROMORPHONE HCL 1 MG/ML IJ SOLN
0.2500 mg | INTRAMUSCULAR | Status: DC | PRN
  Administered 2022-04-17 (×4): 0.5 mg via INTRAVENOUS

## 2022-04-17 MED ORDER — BUPIVACAINE LIPOSOME 1.3 % IJ SUSP: 20.0000 mL | Freq: Once | INTRAMUSCULAR | Status: DC

## 2022-04-17 MED ORDER — ALVIMOPAN 12 MG PO CAPS
12.0000 mg | ORAL_CAPSULE | ORAL | Status: AC
  Administered 2022-04-17: 12 mg via ORAL
  Filled 2022-04-17: qty 1

## 2022-04-17 MED ORDER — CHLORHEXIDINE GLUCONATE 0.12 % MT SOLN
15.0000 mL | Freq: Once | OROMUCOSAL | Status: AC
  Administered 2022-04-17: 15 mL via OROMUCOSAL

## 2022-04-17 MED ORDER — SUCCINYLCHOLINE CHLORIDE 200 MG/10ML IV SOSY
PREFILLED_SYRINGE | INTRAVENOUS | Status: AC
  Filled 2022-04-17: qty 10

## 2022-04-17 MED ORDER — PHENYLEPHRINE 80 MCG/ML (10ML) SYRINGE FOR IV PUSH (FOR BLOOD PRESSURE SUPPORT)
PREFILLED_SYRINGE | INTRAVENOUS | Status: DC | PRN
  Administered 2022-04-17: 120 ug via INTRAVENOUS
  Administered 2022-04-17 (×2): 80 ug via INTRAVENOUS

## 2022-04-17 MED ORDER — ORAL CARE MOUTH RINSE: 15.0000 mL | Freq: Once | OROMUCOSAL | Status: AC

## 2022-04-17 MED ORDER — METHYLENE BLUE 1 % INJ SOLN
INTRAVENOUS | Status: AC
  Filled 2022-04-17: qty 10

## 2022-04-17 MED ORDER — ACETAMINOPHEN 325 MG PO TABS
650.0000 mg | ORAL_TABLET | Freq: Four times a day (QID) | ORAL | Status: DC | PRN
  Filled 2022-04-17: qty 2

## 2022-04-17 MED ORDER — SODIUM CHLORIDE 0.9 % IV SOLN
2.0000 g | INTRAVENOUS | Status: AC
  Administered 2022-04-17: 2 g via INTRAVENOUS
  Filled 2022-04-17: qty 2

## 2022-04-17 MED ORDER — FENTANYL CITRATE PF 50 MCG/ML IJ SOSY
50.0000 ug | PREFILLED_SYRINGE | INTRAMUSCULAR | Status: AC | PRN
  Administered 2022-04-17 (×2): 50 ug via INTRAVENOUS

## 2022-04-17 MED ORDER — LACTATED RINGERS IR SOLN
Status: DC | PRN
  Administered 2022-04-17: 1000 mL

## 2022-04-17 MED ORDER — DEXAMETHASONE SODIUM PHOSPHATE 10 MG/ML IJ SOLN
INTRAMUSCULAR | Status: AC
  Filled 2022-04-17: qty 1

## 2022-04-17 MED ORDER — AMISULPRIDE (ANTIEMETIC) 5 MG/2ML IV SOLN: 10.0000 mg | Freq: Once | INTRAVENOUS | Status: DC | PRN

## 2022-04-17 MED ORDER — HYDROMORPHONE HCL 1 MG/ML IJ SOLN
INTRAMUSCULAR | Status: AC
  Filled 2022-04-17: qty 2

## 2022-04-17 MED ORDER — FENTANYL CITRATE (PF) 100 MCG/2ML IJ SOLN
INTRAMUSCULAR | Status: DC | PRN
Start: 2022-04-17 — End: 2022-04-17
  Administered 2022-04-17: 50 ug via INTRAVENOUS
  Administered 2022-04-17 (×3): 100 ug via INTRAVENOUS

## 2022-04-17 MED ORDER — ENOXAPARIN SODIUM 40 MG/0.4ML IJ SOSY
40.0000 mg | PREFILLED_SYRINGE | Freq: Once | INTRAMUSCULAR | Status: AC
  Administered 2022-04-17: 40 mg via SUBCUTANEOUS
  Filled 2022-04-17: qty 0.4

## 2022-04-17 MED ORDER — HYDRALAZINE HCL 20 MG/ML IJ SOLN
10.0000 mg | Freq: Once | INTRAMUSCULAR | Status: AC
  Administered 2022-04-17: 10 mg via INTRAVENOUS

## 2022-04-17 MED ORDER — PROPOFOL 1000 MG/100ML IV EMUL
INTRAVENOUS | Status: AC
  Filled 2022-04-17: qty 100

## 2022-04-17 MED ORDER — LIDOCAINE 2% (20 MG/ML) 5 ML SYRINGE
INTRAMUSCULAR | Status: DC | PRN
  Administered 2022-04-17: 100 mg via INTRAVENOUS

## 2022-04-17 MED ORDER — HYDRALAZINE HCL 20 MG/ML IJ SOLN
INTRAMUSCULAR | Status: AC
  Filled 2022-04-17: qty 1

## 2022-04-17 MED ORDER — BUPIVACAINE LIPOSOME 1.3 % IJ SUSP
INTRAMUSCULAR | Status: DC | PRN
  Administered 2022-04-17: 20 mL

## 2022-04-17 MED ORDER — SUCCINYLCHOLINE CHLORIDE 200 MG/10ML IV SOSY
PREFILLED_SYRINGE | INTRAVENOUS | Status: DC | PRN
  Administered 2022-04-17: 140 mg via INTRAVENOUS

## 2022-04-17 MED ORDER — ROCURONIUM BROMIDE 10 MG/ML (PF) SYRINGE
PREFILLED_SYRINGE | INTRAVENOUS | Status: AC
  Filled 2022-04-17: qty 10

## 2022-04-17 MED ORDER — BUPIVACAINE-EPINEPHRINE (PF) 0.25% -1:200000 IJ SOLN
INTRAMUSCULAR | Status: AC
  Filled 2022-04-17: qty 60

## 2022-04-17 MED ORDER — FENTANYL CITRATE PF 50 MCG/ML IJ SOSY
PREFILLED_SYRINGE | INTRAMUSCULAR | Status: AC
  Filled 2022-04-17: qty 2

## 2022-04-17 MED ORDER — STERILE WATER FOR IRRIGATION IR SOLN: Status: DC | PRN

## 2022-04-17 MED ORDER — BUPIVACAINE LIPOSOME 1.3 % IJ SUSP
INTRAMUSCULAR | Status: AC
  Filled 2022-04-17: qty 20

## 2022-04-17 MED ORDER — LACTATED RINGERS IV BOLUS
1000.0000 mL | Freq: Three times a day (TID) | INTRAVENOUS | Status: AC | PRN
Start: 2022-04-17 — End: 2022-04-19

## 2022-04-17 MED ORDER — ONDANSETRON HCL 4 MG/2ML IJ SOLN
INTRAMUSCULAR | Status: AC
  Filled 2022-04-17: qty 2

## 2022-04-17 MED ORDER — BUPIVACAINE-EPINEPHRINE (PF) 0.25% -1:200000 IJ SOLN
INTRAMUSCULAR | Status: DC | PRN
Start: 2022-04-17 — End: 2022-04-17
  Administered 2022-04-17: 60 mL

## 2022-04-17 MED ORDER — DEXAMETHASONE SODIUM PHOSPHATE 4 MG/ML IJ SOLN
INTRAMUSCULAR | Status: DC | PRN
Start: 2022-04-17 — End: 2022-04-17
  Administered 2022-04-17: 10 mg via INTRAVENOUS

## 2022-04-17 MED ORDER — MIDAZOLAM HCL 2 MG/2ML IJ SOLN
INTRAMUSCULAR | Status: AC
  Filled 2022-04-17: qty 2

## 2022-04-17 MED ORDER — LABETALOL HCL 5 MG/ML IV SOLN
INTRAVENOUS | Status: DC | PRN
  Administered 2022-04-17: 5 mg via INTRAVENOUS
  Administered 2022-04-17: 7.5 mg via INTRAVENOUS
  Administered 2022-04-17: 5 mg via INTRAVENOUS

## 2022-04-17 MED ORDER — SODIUM CHLORIDE (PF) 0.9 % IJ SOLN
INTRAMUSCULAR | Status: AC
  Filled 2022-04-17: qty 10

## 2022-04-17 MED ORDER — ONDANSETRON HCL 4 MG/2ML IJ SOLN
INTRAMUSCULAR | Status: DC | PRN
  Administered 2022-04-17: 4 mg via INTRAVENOUS

## 2022-04-17 MED ORDER — HYDROMORPHONE HCL 1 MG/ML IJ SOLN
INTRAMUSCULAR | Status: AC
  Filled 2022-04-17: qty 1

## 2022-04-17 MED ORDER — 0.9 % SODIUM CHLORIDE (POUR BTL) OPTIME
TOPICAL | Status: DC | PRN
  Administered 2022-04-17: 1000 mL

## 2022-04-17 MED ORDER — SUGAMMADEX SODIUM 200 MG/2ML IV SOLN
INTRAVENOUS | Status: DC | PRN
  Administered 2022-04-17: 300 mg via INTRAVENOUS

## 2022-04-17 MED ORDER — ENOXAPARIN SODIUM 40 MG/0.4ML IJ SOSY
40.0000 mg | PREFILLED_SYRINGE | INTRAMUSCULAR | Status: DC
  Administered 2022-04-18 – 2022-04-22 (×5): 40 mg via SUBCUTANEOUS
  Filled 2022-04-17 (×5): qty 0.4

## 2022-04-17 MED ORDER — SUGAMMADEX SODIUM 500 MG/5ML IV SOLN
INTRAVENOUS | Status: AC
  Filled 2022-04-17: qty 5

## 2022-04-17 MED ORDER — METHOCARBAMOL 1000 MG/10ML IJ SOLN
1000.0000 mg | Freq: Four times a day (QID) | INTRAMUSCULAR | Status: DC | PRN
Start: 2022-04-17 — End: 2022-04-19
  Administered 2022-04-18 – 2022-04-19 (×2): 1000 mg via INTRAVENOUS
  Filled 2022-04-17 (×2): qty 1000

## 2022-04-17 MED ORDER — PROPOFOL 500 MG/50ML IV EMUL
INTRAVENOUS | Status: DC | PRN
  Administered 2022-04-17: 50 ug/kg/min via INTRAVENOUS

## 2022-04-17 MED ORDER — LABETALOL HCL 5 MG/ML IV SOLN
INTRAVENOUS | Status: AC
  Filled 2022-04-17: qty 4

## 2022-04-17 MED ORDER — ROCURONIUM BROMIDE 10 MG/ML (PF) SYRINGE
PREFILLED_SYRINGE | INTRAVENOUS | Status: DC | PRN
  Administered 2022-04-17: 30 mg via INTRAVENOUS
  Administered 2022-04-17: 20 mg via INTRAVENOUS
  Administered 2022-04-17: 100 mg via INTRAVENOUS

## 2022-04-17 MED ORDER — FENTANYL CITRATE (PF) 100 MCG/2ML IJ SOLN
INTRAMUSCULAR | Status: AC
  Filled 2022-04-17: qty 2

## 2022-04-17 MED ORDER — PROPOFOL 10 MG/ML IV BOLUS
INTRAVENOUS | Status: AC
  Filled 2022-04-17: qty 20

## 2022-04-17 MED ORDER — PROPOFOL 10 MG/ML IV BOLUS
INTRAVENOUS | Status: DC | PRN
  Administered 2022-04-17: 200 mg via INTRAVENOUS

## 2022-04-17 MED ORDER — KETAMINE HCL 10 MG/ML IJ SOLN
INTRAMUSCULAR | Status: DC | PRN
  Administered 2022-04-17: 10 mg via INTRAVENOUS
  Administered 2022-04-17: 40 mg via INTRAVENOUS

## 2022-04-17 MED ORDER — HYDRALAZINE HCL 20 MG/ML IJ SOLN
INTRAMUSCULAR | Status: DC | PRN
Start: 2022-04-17 — End: 2022-04-17
  Administered 2022-04-17 (×3): 2.5 mg via INTRAVENOUS

## 2022-04-17 MED ORDER — LACTATED RINGERS IV SOLN: INTRAVENOUS | Status: DC

## 2022-04-17 MED ORDER — PHENYLEPHRINE 80 MCG/ML (10ML) SYRINGE FOR IV PUSH (FOR BLOOD PRESSURE SUPPORT)
PREFILLED_SYRINGE | INTRAVENOUS | Status: AC
  Filled 2022-04-17: qty 10

## 2022-04-17 MED ORDER — KETOROLAC TROMETHAMINE 15 MG/ML IJ SOLN
INTRAMUSCULAR | Status: DC | PRN
  Administered 2022-04-17: 15 mg via INTRAVENOUS

## 2022-04-17 MED ORDER — HYDROMORPHONE HCL 1 MG/ML IJ SOLN
1.0000 mg | Freq: Once | INTRAMUSCULAR | Status: AC
  Administered 2022-04-17: 1 mg via INTRAVENOUS

## 2022-04-17 MED ORDER — LIDOCAINE HCL (PF) 2 % IJ SOLN
INTRAMUSCULAR | Status: AC
  Filled 2022-04-17: qty 5

## 2022-04-17 MED ORDER — ACETAMINOPHEN 500 MG PO TABS
1000.0000 mg | ORAL_TABLET | ORAL | Status: AC
  Administered 2022-04-17: 1000 mg via ORAL
  Filled 2022-04-17: qty 2

## 2022-04-17 MED ORDER — KETOROLAC TROMETHAMINE 30 MG/ML IJ SOLN
30.0000 mg | Freq: Four times a day (QID) | INTRAMUSCULAR | Status: AC | PRN
  Administered 2022-04-17 – 2022-04-18 (×2): 30 mg via INTRAVENOUS
  Filled 2022-04-17 (×2): qty 1

## 2022-04-17 MED ORDER — KETOROLAC TROMETHAMINE 30 MG/ML IJ SOLN
INTRAMUSCULAR | Status: AC
  Filled 2022-04-17: qty 1

## 2022-04-17 SURGICAL SUPPLY — 118 items
APPLIER CLIP 5 13 M/L LIGAMAX5 (MISCELLANEOUS)
APPLIER CLIP ROT 10 11.4 M/L (STAPLE)
BAG COUNTER SPONGE SURGICOUNT (BAG) ×2 IMPLANT
BLADE EXTENDED COATED 6.5IN (ELECTRODE) IMPLANT
CANNULA REDUC XI 12-8 STAPL (CANNULA) ×1
CANNULA REDUCER 12-8 DVNC XI (CANNULA) IMPLANT
CATH MUSHROOM 32FR (CATHETERS) ×1 IMPLANT
CELLS DAT CNTRL 66122 CELL SVR (MISCELLANEOUS) IMPLANT
CHLORAPREP W/TINT 26 (MISCELLANEOUS) IMPLANT
CLIP APPLIE 5 13 M/L LIGAMAX5 (MISCELLANEOUS) IMPLANT
CLIP APPLIE ROT 10 11.4 M/L (STAPLE) IMPLANT
COVER SURGICAL LIGHT HANDLE (MISCELLANEOUS) ×4 IMPLANT
COVER TIP SHEARS 8 DVNC (MISCELLANEOUS) ×1 IMPLANT
COVER TIP SHEARS 8MM DA VINCI (MISCELLANEOUS) ×1
DEVICE TROCAR PUNCTURE CLOSURE (ENDOMECHANICALS) IMPLANT
DRAIN CHANNEL 19F RND (DRAIN) ×1 IMPLANT
DRAPE ARM DVNC X/XI (DISPOSABLE) ×4 IMPLANT
DRAPE COLUMN DVNC XI (DISPOSABLE) ×1 IMPLANT
DRAPE DA VINCI XI ARM (DISPOSABLE) ×4
DRAPE DA VINCI XI COLUMN (DISPOSABLE) ×1
DRAPE SURG IRRIG POUCH 19X23 (DRAPES) ×2 IMPLANT
DRSG OPSITE POSTOP 4X10 (GAUZE/BANDAGES/DRESSINGS) IMPLANT
DRSG OPSITE POSTOP 4X6 (GAUZE/BANDAGES/DRESSINGS) IMPLANT
DRSG OPSITE POSTOP 4X8 (GAUZE/BANDAGES/DRESSINGS) IMPLANT
DRSG TEGADERM 2-3/8X2-3/4 SM (GAUZE/BANDAGES/DRESSINGS) ×10 IMPLANT
DRSG TEGADERM 4X4.75 (GAUZE/BANDAGES/DRESSINGS) IMPLANT
ELECT PENCIL ROCKER SW 15FT (MISCELLANEOUS) ×2 IMPLANT
ELECT REM PT RETURN 15FT ADLT (MISCELLANEOUS) ×2 IMPLANT
ENDOLOOP SUT PDS II  0 18 (SUTURE)
ENDOLOOP SUT PDS II 0 18 (SUTURE) IMPLANT
EVACUATOR SILICONE 100CC (DRAIN) ×1 IMPLANT
GAUZE SPONGE 2X2 8PLY STRL LF (GAUZE/BANDAGES/DRESSINGS) ×1 IMPLANT
GLOVE ECLIPSE 8.0 STRL XLNG CF (GLOVE) ×6 IMPLANT
GLOVE INDICATOR 8.0 STRL GRN (GLOVE) ×6 IMPLANT
GOWN SRG XL LVL 4 BRTHBL STRL (GOWNS) ×1 IMPLANT
GOWN STRL NON-REIN XL LVL4 (GOWNS) ×1
GOWN STRL REUS W/ TWL XL LVL3 (GOWN DISPOSABLE) ×4 IMPLANT
GOWN STRL REUS W/TWL XL LVL3 (GOWN DISPOSABLE) ×4
GRASPER SUT TROCAR 14GX15 (MISCELLANEOUS) IMPLANT
HOLDER FOLEY CATH W/STRAP (MISCELLANEOUS) ×2 IMPLANT
IRRIG SUCT STRYKERFLOW 2 WTIP (MISCELLANEOUS) ×2
IRRIGATION SUCT STRKRFLW 2 WTP (MISCELLANEOUS) ×1 IMPLANT
KIT PROCEDURE DA VINCI SI (MISCELLANEOUS)
KIT PROCEDURE DVNC SI (MISCELLANEOUS) IMPLANT
KIT SIGMOIDOSCOPE (SET/KITS/TRAYS/PACK) IMPLANT
KIT TURNOVER KIT A (KITS) IMPLANT
NDL INSUFFLATION 14GA 120MM (NEEDLE) ×1 IMPLANT
NEEDLE INSUFFLATION 14GA 120MM (NEEDLE) ×2 IMPLANT
PACK CARDIOVASCULAR III (CUSTOM PROCEDURE TRAY) ×2 IMPLANT
PACK COLON (CUSTOM PROCEDURE TRAY) ×2 IMPLANT
PAD POSITIONING PINK XL (MISCELLANEOUS) ×2 IMPLANT
PLUG CATH AND CAP STER (CATHETERS) ×1 IMPLANT
PROTECTOR NERVE ULNAR (MISCELLANEOUS) ×4 IMPLANT
RELOAD STAPLE 45 3.5 BLU DVNC (STAPLE) IMPLANT
RELOAD STAPLE 45 4.3 GRN DVNC (STAPLE) IMPLANT
RELOAD STAPLE 60 3.5 BLU DVNC (STAPLE) IMPLANT
RELOAD STAPLE 60 4.3 GRN DVNC (STAPLE) IMPLANT
RELOAD STAPLER 3.5X45 BLU DVNC (STAPLE) IMPLANT
RELOAD STAPLER 3.5X60 BLU DVNC (STAPLE) IMPLANT
RELOAD STAPLER 4.3X45 GRN DVNC (STAPLE) IMPLANT
RELOAD STAPLER 4.3X60 GRN DVNC (STAPLE) ×1 IMPLANT
RETRACTOR WND ALEXIS 18 MED (MISCELLANEOUS) IMPLANT
RTRCTR WOUND ALEXIS 18CM MED (MISCELLANEOUS)
SCISSORS LAP 5X35 DISP (ENDOMECHANICALS) ×3 IMPLANT
SEAL CANN UNIV 5-8 DVNC XI (MISCELLANEOUS) ×3 IMPLANT
SEAL XI 5MM-8MM UNIVERSAL (MISCELLANEOUS) ×3
SEALER VESSEL DA VINCI XI (MISCELLANEOUS) ×1
SEALER VESSEL EXT DVNC XI (MISCELLANEOUS) ×1 IMPLANT
SET IRRIG Y TYPE TUR BLADDER L (SET/KITS/TRAYS/PACK) ×1 IMPLANT
SOLUTION ELECTROLUBE (MISCELLANEOUS) ×2 IMPLANT
SPIKE FLUID TRANSFER (MISCELLANEOUS) ×2 IMPLANT
SPONGE GAUZE 2X2 STER 10/PKG (GAUZE/BANDAGES/DRESSINGS) ×1
STAPLER 45 DA VINCI SURE FORM (STAPLE)
STAPLER 45 SUREFORM DVNC (STAPLE) IMPLANT
STAPLER 60 DA VINCI SURE FORM (STAPLE) ×1
STAPLER 60 SUREFORM DVNC (STAPLE) IMPLANT
STAPLER CANNULA SEAL DVNC XI (STAPLE) ×1 IMPLANT
STAPLER CANNULA SEAL XI (STAPLE) ×1
STAPLER ECHELON POWER CIR 29 (STAPLE) IMPLANT
STAPLER ECHELON POWER CIR 31 (STAPLE) IMPLANT
STAPLER RELOAD 3.5X45 BLU DVNC (STAPLE)
STAPLER RELOAD 3.5X45 BLUE (STAPLE)
STAPLER RELOAD 3.5X60 BLU DVNC (STAPLE)
STAPLER RELOAD 3.5X60 BLUE (STAPLE)
STAPLER RELOAD 4.3X45 GREEN (STAPLE)
STAPLER RELOAD 4.3X45 GRN DVNC (STAPLE)
STAPLER RELOAD 4.3X60 GREEN (STAPLE) ×1
STAPLER RELOAD 4.3X60 GRN DVNC (STAPLE) ×1
STOPCOCK 4 WAY LG BORE MALE ST (IV SETS) ×4 IMPLANT
SURGILUBE 2OZ TUBE FLIPTOP (MISCELLANEOUS) IMPLANT
SUT MNCRL AB 4-0 PS2 18 (SUTURE) ×3 IMPLANT
SUT PDS AB 1 CT1 27 (SUTURE) ×4 IMPLANT
SUT PROLENE 0 CT 2 (SUTURE) IMPLANT
SUT PROLENE 2 0 KS (SUTURE) IMPLANT
SUT PROLENE 2 0 SH DA (SUTURE) ×1 IMPLANT
SUT SILK 2 0 (SUTURE)
SUT SILK 2 0 SH CR/8 (SUTURE) IMPLANT
SUT SILK 2-0 18XBRD TIE 12 (SUTURE) IMPLANT
SUT SILK 3 0 (SUTURE)
SUT SILK 3 0 SH CR/8 (SUTURE) ×2 IMPLANT
SUT SILK 3-0 18XBRD TIE 12 (SUTURE) IMPLANT
SUT V-LOC BARB 180 2/0GR6 GS22 (SUTURE) ×4
SUT VIC AB 2-0 SH 18 (SUTURE) ×1 IMPLANT
SUT VIC AB 3-0 SH 18 (SUTURE) IMPLANT
SUT VIC AB 3-0 SH 27 (SUTURE)
SUT VIC AB 3-0 SH 27XBRD (SUTURE) IMPLANT
SUT VICRYL 0 UR6 27IN ABS (SUTURE) ×2 IMPLANT
SUTURE V-LC BRB 180 2/0GR6GS22 (SUTURE) IMPLANT
SYR 10ML ECCENTRIC (SYRINGE) ×2 IMPLANT
SYS LAPSCP GELPORT 120MM (MISCELLANEOUS)
SYS WOUND ALEXIS 18CM MED (MISCELLANEOUS) ×2
SYSTEM LAPSCP GELPORT 120MM (MISCELLANEOUS) IMPLANT
SYSTEM WOUND ALEXIS 18CM MED (MISCELLANEOUS) ×1 IMPLANT
TOWEL OR NON WOVEN STRL DISP B (DISPOSABLE) ×2 IMPLANT
TRAY FOLEY MTR SLVR 16FR STAT (SET/KITS/TRAYS/PACK) ×2 IMPLANT
TROCAR ADV FIXATION 5X100MM (TROCAR) ×2 IMPLANT
TUBING CONNECTING 10 (TUBING) ×4 IMPLANT
TUBING INSUFFLATION 10FT LAP (TUBING) ×2 IMPLANT

## 2022-04-17 NOTE — Discharge Instructions (Signed)
#######################################################  Ostomy Support Information  You've heard that people get along just fine with only one of their eyes, or one of their lungs, or one of their kidneys. But you also know that you have only one intestine and only one bladder, and that leaves you feeling awfully empty, both physically and emotionally: You think no other people go around without part of their intestine with the ends of their intestines sticking out through their abdominal walls.   YOU ARE NOT ALONE.  There are nearly three quarters of a million people in the [place] who have an ostomy; people who have had surgery to remove all or part of their colons or bladders.   There is even a national association, the [place] [employer] [place] with over 350 local affiliated support groups that are organized by volunteers who provide peer support and counseling. Paul Gilmore has a toll free telephone num-ber, [phone] and an educational, interactive website, www.ostomy.org   An ostomy is an opening in the belly (abdominal wall) made by surgery. Ostomates are people who have had this procedure. The opening (stoma) allows the kidney or bowel to grdischarge waste. An external pouch covers the stoma to collect waste. Pouches are are a simple bag and are odor free. Different companies have disposable or reusable pouches to fit one's lifestyle. An ostomy can either be temporary or permanent.   THERE ARE THREE MAIN TYPES OF OSTOMIES Colostomy. A colostomy is a surgically created opening in the large intestine (colon). Ileostomy. An ileostomy is a surgically created opening in the small intestine. Urostomy. A urostomy is a surgically created opening to divert urine away from the bladder.  OSTOMY Care  The following guidelines will make care of your colostomy easier. Keep this information close by for quick reference.  Helpful DIET hints Eat a well-balanced diet including vegetables and fresh  fruits. Eat on a regular schedule.  Drink at least 6 to 8 glasses of fluids daily. Eat slowly in a relaxed atmosphere. Chew your food thoroughly. Avoid chewing gum, smoking, and drinking from a straw. This will help decrease the amount of air you swallow, which may help reduce gas. Eating yogurt or drinking buttermilk may help reduce gas.  To control gas at night, do not eat after 8 p.m. This will give your bowel time to quiet down before you go to bed.  If gas is a problem, you can purchase Beano. Sprinkle Beano on the first bite of food before eating to reduce gas. It has no flavor and should not change the taste of your food. You can buy Beano over the counter at your local drugstore.  Foods like fish, onions, garlic, broccoli, asparagus, and cabbage produce odor. Although your pouch is odor-proof, if you eat these foods you may notice a stronger odor when emptying your pouch. If this is a concern, you may want to limit these foods in your diet.  If you have an ileostomy, you will have chronic diarrhea & need to drink more liquids to avoid getting dehydrated.  Consider antidiarrheal medicine like imodium (loperamide) or Lomotil to help slow down bowel movements / diarrhea into your ileostomy bag.  GETTING TO GOOD BOWEL HEALTH WITH AN ILEOSTOMY    With the colon bypassed & not in use, you will have small bowel diarrhea.   It is important to thicken & slow your bowel movements down.   The goal: 4-6 small BOWEL MOVEMENTS A DAY It is important to drink plenty of liquids to avoid  getting dehydrated  CONTROLLING ILEOSTOMY DIARRHEA  TAKE A FIBER SUPPLEMENT (FiberCon or Benefiner soluble fiber) twice a day - to thicken stools by absorbing excess fluid and retrain the intestines to act more normally.  Slowly increase the dose over a few weeks.  Too much fiber too soon can backfire and cause cramping & bloating.  TAKE AN IRON SUPPLEMENT twice a day to naturally constipate your bowels.  Usually  ferrous sulfate 326m twice a day)  TAKE ANTI-DIARRHEAL MEDICINES: Loperamide (Imodium) can slow down diarrhea.  Start with two tablets (= 4563m first and then try one tablet every 6 hours.  Can go up to 2 pills four times day (8 pills of 63m16max) Avoid if you are having fevers or severe pain.  If you are not better or start feeling worse, stop all medicines and call your doctor for advice LoMotil (Diphenoxylate / Atropine) is another medicine that can constipate & slow down bowel moevements Pepto Bismol (bismuth) can gently thicken bowels as well  If diarrhea is worse,: drink plenty of liquids and try simpler foods for a few days to avoid stressing your intestines further. Avoid dairy products (especially milk & ice cream) for a short time.  The intestines often can lose the ability to digest lactose when stressed. Avoid foods that cause gassiness or bloating.  Typical foods include beans and other legumes, cabbage, broccoli, and dairy foods.  Every person has some sensitivity to other foods, so listen to our body and avoid those foods that trigger problems for you.Call your doctor if you are getting worse or not better.  Sometimes further testing (cultures, endoscopy, X-ray studies, bloodwork, etc) may be needed to help diagnose and treat the cause of the diarrhea. Take extra anti-diarrheal medicines (maximum is 8 pills of 63mg66mperamide a day)   Tips for POUCHING an OSTOMY   Changing Your Pouch The best time to change your pouch is in the morning, before eating or drinking anything. Your stoma can function at any time, but it will function more after eating or drinking.   Applying the pouching system  Place all your equipment close at hand before removing your pouch.  Wash your hands.  Stand or sit in front of a mirror. Use the position that works best for you. Remember that you must keep the skin around the stoma wrinkle-free for a good seal.  Gently remove the used pouch (1-piece  system) or the pouch and old wafer (2-piece system). Empty the pouch into the toilet. Save the closure clip to use again.  Wash the stoma itself and the skin around the stoma. Your stoma may bleed a little when being washed. This is normal. Rinse and pat dry. You may use a wash cloth or soft paper towels (like Bounty), mild soap (like Dial, Safeguard, or IvorMongoliand water. Avoid soaps that contain perfumes or lotions.  For a new pouch (1-piece system) or a new wafer (2-piece system), measure your stoma using the stoma guide in each box of supplies.  Trace the shape of your stoma onto the back of the new pouch or the back of the new wafer. Cut out the opening. Remove the paper backing and set it aside.  Optional: Apply a skin barrier powder to surrounding skin if it is irritated (bare or weeping), and dust off the excess. Optional: Apply a skin-prep wipe (such as Skin Prep or All-Kare) to the skin around the stoma, and let it dry. Do not apply this solution if the  skin is irritated (red, tender, or broken) or if you have shaved around the stoma. Optional: Apply a skin barrier paste (such as Stomahesive, Coloplast, or Premium) around the opening cut in the back of the pouch or wafer. Allow it to dry for 30 to 60 seconds.  Hold the pouch (1-piece system) or wafer (2-piece system) with the sticky side toward your body. Make sure the skin around the stoma is wrinkle-free. Center the opening on the stoma, then press firmly to your abdomen (Fig. 4). Look in the mirror to check if you are placing the pouch, or wafer, in the right position. For a 2-piece system, snap the pouch onto the wafer. Make sure it snaps into place securely.  Place your hand over the stoma and the pouch or wafer for about 30 seconds. The heat from your hand can help the pouch or wafer stick to your skin.  Add deodorant (such as Super Banish or Nullo) to your pouch. Other options include food extracts such as vanilla oil and peppermint  extract. Add about 10 drops of the deodorant to the pouch. Then apply the closure clamp. Note: Do not use toxic  chemicals or commercial cleaning agents in your pouch. These substances may harm the stoma.  Optional: For extra seal, apply tape to all 4 sides around the pouch or wafer, as if you were framing a picture. You may use any brand of medical adhesive tape. Change your pouch every 5 to 7 days. Change it immediately if a leak occurs.  Wash your hands afterwards.  If you are wearing a 2-piece system, you may use 2 new pouches per week and alternate them. Rinse the pouch with mild soap and warm water and hang it to dry for the next day. Apply the fresh pouch. Alternate the 2 pouches like this for a week. After a week, change the wafer and begin with 2 new pouches. Place the old pouches in a plastic bag, and put them in the trash.   LIVING WITH AN OSTOMY  Emptying Your Pouch Empty your pouch when it is one-third full (of urine, stool, and/or gas). If you wait until your pouch is fuller than this, it will be more difficult to empty and more noticeable. When you empty your pouch, either put toilet paper in the toilet bowl first, or flush the toilet while you empty the pouch. This will reduce splashing. You can empty the pouch between your legs or to one side while sitting, or while standing or stooping. If you have a 2-piece system, you can snap off the pouch to empty it. Remember that your stoma may function during this time. If you wish to rinse your pouch after you empty it, a [place] baster can be helpful. When using a baster, squirt water up into the pouch through the opening at the bottom. With a 2-piece system, you can snap off the pouch to rinse it. After rinsing  your pouch, empty it into the toilet. When rinsing your pouch at home, put a few granules of Dreft soap in the rinse water. This helps lubricate and freshen your pouch. The inside of your pouch can be sprayed with non-stick cooking  oil (Pam spray). This may help reduce stool sticking to the inside of the pouch.  Bathing You may shower or bathe with your pouch on or off. Remember that your stoma may function during this time.  The materials you use to wash your stoma and the skin around it should be  clean, but they do not need to be sterile.  Wearing Your Pouch During hot weather, or if you perspire a lot in general, wear a cover over your pouch. This may prevent a rash on your skin under the pouch. Pouch covers are sold at ostomy supply stores. Wear the pouch inside your underwear for better support. Watch your weight. Any gain or loss of 10 to 15 pounds or more can change the way your pouch fits.  Going Away From Home A collapsible cup (like those that come in travel kits) or a soft plastic squirt bottle with a pull-up top (like a travel bottle for shampoo) can be used for rinsing your pouch when you are away from home. Tilt the opening of the pouch at an upward angle when using a cup to rinse.  Carry wet wipes or extra tissues to use in public bathrooms.  Carry an extra pouching system with you at all times.  Never keep ostomy supplies in the glove compartment of your car. Extreme heat or cold can damage the skin barriers and adhesive wafers on the pouch.  When you travel, carry your ostomy supplies with you at all times. Keep them within easy reach. Do not pack ostomy supplies in baggage that will be checked or otherwise separated from you, because your baggage might be lost. If you're traveling out of the country, it is helpful to have a letter stating that you are carrying ostomy supplies as a medical necessity.  If you need ostomy supplies while traveling, look in the yellow pages of the telephone book under "Surgical Supplies." Or call the local ostomy organization to find out where supplies are available.  Do not let your ostomy supplies get low. Always order new pouches before you use the last one.  Reducing  Odor Limit foods such as broccoli, cabbage, onions, fish, and garlic in your diet to help reduce odor. Each time you empty your pouch, carefully clean the opening of the pouch, both inside and outside, with toilet paper. Rinse your pouch 1 or 2 times daily after you empty it (see directions for emptying your pouch and going away from home). Add deodorant (such as Super Banish or Nullo) to your pouch. Use air deodorizers in your bathroom. Do not add aspirin to your pouch. Even though aspirin can help prevent odor, it could cause ulcers on your stoma.  When to call the doctor Call the doctor if you have any of the following symptoms: Purple, black, or white stoma Severe cramps lasting more than 6 hours Severe watery discharge from the stoma lasting more than 6 hours No output from the colostomy for 3 days Excessive bleeding from your stoma Swelling of your stoma to more than 1/2-inch larger than usual Pulling inward of your stoma below skin level Severe skin irritation or deep ulcers Bulging or other changes in your abdomen  When to call your ostomy nurse Call your ostomy/enterostomal therapy (WOCN) nurse if any of the following occurs: Frequent leaking of your pouching system Change in size or appearance of your stoma, causing discomfort or problems with your pouch Skin rash or rawness Weight gain or loss that causes problems with your pouch     FREQUENTLY ASKED QUESTIONS   Why haven't you met any of these folks who have an ostomy?  Well, maybe you have! You just did not recognize them because an ostomy doesn't show. It can be kept secret if you wish. Why, maybe some of your best friends, office  associates or neighbors have an ostomy ... you never can tell. People facing ostomy surgery have many quality-of-life questions like: Will you bulge? Smell? Make noises? Will you feel waste leaving your body? Will you be a captive of the toilet? Will you starve? Be a social outcast? Get/stay  married? Have babies? Easily bathe, go swimming, bend over?  OK, let's look at what you can expect:   Will you bulge?  Remember, without part of the intestine or bladder, and its contents, you should have a flatter tummy than before. You can expect to wear, with little exception, what you wore before surgery ... and this in-cludes tight clothing and bathing suits.   Will you smell?  Today, thanks to modern odor proof pouching systems, you can walk into an ostomy support group meeting and not smell anything that is foul or offensive. And, for those with an ileostomy or colostomy who are concerned about odor when emptying their pouch, there are in-pouch deodorants that can be used to eliminate any waste odors that may exist.   Will you make noises?  Everyone produces gas, especially if they are an air-swallower. But intestinal sounds that occur from time to time are no differ-ent than a gurgling tummy, and quite often your clothing will muffle any sounds.   Will you feel the waste discharges?  For those with a colostomy or ileostomy there might be a slight pressure when waste leaves your body, but understand that the intestines have no nerve endings, so there will be no unpleasant sensations. Those with a urostomy will probably be unaware of any kidney drainage.   Will you be a captive of the toilet?  Immediately post-op you will spend more time in the bathroom than you will after your body recovers from surgery. Every person is different, but on average those with an ileostomy or urostomy may empty their pouches 4 to 6 times a day; a little  less if you have a colostomy. The average wear time between pouch system changes is 3 to 5 days and the changing process should take less than 30 minutes.   Will I need to be on a special diet? Most people return to their normal diet when they have recovered from surgery. Be sure to chew your food well, eat a well-balanced diet and drink plenty of fluids. If  you experience problems with a certain food, wait a couple of weeks and try it again.  Will there be odor and noises? Pouching systems are designed to be odor-proof or odor-resistant. There are deodorants that can be used in the pouch. Medications are also available to help reduce odor. Limit gas-producing foods and carbonated beverages. You will experience less gas and fewer noises as you heal from surgery.  How much time will it take to care for my ostomy? At first, you may spend a lot of time learning about your ostomy and how to take care of it. As you become more comfortable and skilled at changing the pouching system, it will take very little time to care for it.   Will I be able to return to work? People with ostomies can perform most jobs. As soon as you have healed from surgery, you should be able to return to work. Heavy lifting (more than 10 pounds) may be discouraged.   What about intimacy? Sexual relationships and intimacy are important and fulfilling aspects of your life. They should continue after ostomy surgery. Intimacy-related concerns should be discussed openly between you and  your partner.   Can I wear regular clothing? You do not need to wear special clothing. Ostomy pouches are fairly flat and barely noticeable. Elastic undergarments will not hurt the stoma or prevent the ostomy from functioning.   Can I participate in sports? An ostomy should not limit your involvement in sports. Many people with ostomies are runners, skiers, swimmers or participate in other active lifestyles. Talk with your caregiver first before doing heavy physical activity.  Will you starve?  Not if you follow doctor's orders at each stage of your post-op adjustment. There is no such thing as an "ostomy diet". Some people with an ostomy will be able to eat and tolerate anything; others may find diffi-culty with some foods. Each person is an individual and must determine, by trial, what is best for  them. A good practice for all is to drink plenty of water.   Will you be a social outcast?  Have you met anyone who has an ostomy and is a social outcast? Why should you be the first? Only your attitude and self image will effect how you are treated. No confi-dent person is an Occupational psychologist.    PROFESSIONAL HELP   Resources are available if you need help or have questions about your ostomy.   Specially trained nurses called Wound, Ostomy Continence Nurses (WOCN) are available for consultation in most major medical centers.  Consider getting an ostomy consult at an outpatient ostomy clinic.   [facility] has an [facility] run by an Programmer, systems at the [facility].  [phone]. [facility] can help set up an appointment   The [employer] (UOA) is a group made up of many local chapters throughout the [place]. These local groups hold meetings and provide support to prospective and existing ostomates. They sponsor educational events and have qualified visitors to make personal or telephone visits. Contact the UOA for the chapter nearest you and for other educational publications.  More detailed information can be found in Colostomy Guide, a publication of the [employer] (UOA). Contact UOA at [phone] or visit their web site at [email or web address] The website contains links to other sites, suppliers and resources.  Tree surgeon Start Services: Start at the website to enlist for support.  Your Wound Ostomy (WOCN) nurse may have started this process. [email or web address]://[email or web address].[email or web address]/securestart Secure Start services are designed to support people as they live their lives with an ostomy or neurogenic bladder. Enrolling is easy and at no cost to the patient. We realize that each person's needs and life journey are different. Through Secure Start services, we want to help people live their life, their  way.  #######################################################     SURGERY: POST OP INSTRUCTIONS (Surgery for small bowel obstruction, colon resection, etc)   ######################################################################  EAT Gradually transition to a high fiber diet with a fiber supplement over the next few days after discharge  WALK Walk an hour a day.  Control your pain to do that.    CONTROL PAIN Control pain so that you can walk, sleep, tolerate sneezing/coughing, go up/down stairs.  HAVE A BOWEL MOVEMENT DAILY Keep your bowels regular to avoid problems.  OK to try a laxative to override constipation.  OK to use an antidairrheal to slow down diarrhea.  Call if not better after 2 tries  CALL IF YOU HAVE PROBLEMS/CONCERNS Call if you are still struggling despite following these instructions. Call if you have concerns not answered by these instructions  ######################################################################  DIET Follow a light diet the first few days at home.  Start with a bland diet such as soups, liquids, starchy foods, low fat foods, etc.  If you feel full, bloated, or constipated, stay on a ful liquid or pureed/blenderized diet for a few days until you feel better and no longer constipated. Be sure to drink plenty of fluids every day to avoid getting dehydrated (feeling dizzy, not urinating, etc.). Gradually add a fiber supplement to your diet over the next week.  Gradually get back to a regular solid diet.  Avoid fast food or heavy meals the first week as you are more likely to get nauseated. It is expected for your digestive tract to need a few months to get back to normal.  It is common for your bowel movements and stools to be irregular.  You will have occasional bloating and cramping that should eventually fade away.  Until you are eating solid food normally, off all pain medications, and back to regular activities; your bowels will not be  normal. Focus on eating a low-fat, high fiber diet the rest of your life (See Getting to [place], below).  CARE of your INCISION or WOUND  It is good for closed incisions and even open wounds to be washed every day.  Shower every day.  Short baths are fine.  Wash the incisions and wounds clean with soap & water.    You may leave closed incisions open to air if it is dry.   You may cover the incision with clean gauze & replace it after your daily shower for comfort.  TEGADERM:  You have clear gauze band-aid dressings over your closed incision(s).  Remove the dressings 3 days after surgery.= 5/26 Friday     If you have an open wound with a wound vac, see wound vac care instructions.    ACTIVITIES as tolerated Start light daily activities --- self-care, walking, climbing stairs-- beginning the day after surgery.  Gradually increase activities as tolerated.  Control your pain to be active.  Stop when you are tired.  Ideally, walk several times a day, eventually an hour a day.   Most people are back to most day-to-day activities in a few weeks.  It takes 4-8 weeks to get back to unrestricted, intense activity. If you can walk 30 minutes without difficulty, it is safe to try more intense activity such as jogging, treadmill, bicycling, low-impact aerobics, swimming, etc. Save the most intensive and strenuous activity for last (Usually 4-8 weeks after surgery) such as sit-ups, heavy lifting, contact sports, etc.  Refrain from any intense heavy lifting or straining until you are off narcotics for pain control.  You will have off days, but things should improve week-by-week. DO NOT PUSH THROUGH PAIN.  Let pain be your guide: If it hurts to do something, don't do it.  Pain is your body warning you to avoid that activity for another week until the pain goes down. You may drive when you are no longer taking narcotic prescription pain medication, you can comfortably wear a seatbelt, and you can  safely make sudden turns/stops to protect yourself without hesitating due to pain. You may have sexual intercourse when it is comfortable. If it hurts to do something, stop.  MEDICATIONS Take your usually prescribed home medications unless otherwise directed.   Blood thinners:  Usually you can restart any strong blood thinners after the second postoperative day.  It is OK to take aspirin right away.  If you are on strong blood thinners (warfarin/Coumadin, Plavix, Xerelto, Eliquis, Pradaxa, etc), discuss with your surgeon, medicine PCP, and/or cardiologist for instructions on when to restart the blood thinner & if blood monitoring is needed (PT/INR blood check, etc).     PAIN CONTROL Pain after surgery or related to activity is often due to strain/injury to muscle, tendon, nerves and/or incisions.  This pain is usually short-term and will improve in a few months.  To help speed the process of healing and to get back to regular activity more quickly, DO THE FOLLOWING THINGS TOGETHER: Increase activity gradually.  DO NOT PUSH THROUGH PAIN Use Ice and/or Heat Try Gentle Massage and/or Stretching Take over the counter pain medication Take Narcotic prescription pain medication for more severe pain  Good pain control = faster recovery.  It is better to take more medicine to be more active than to stay in bed all day to avoid medications.  Increase activity gradually Avoid heavy lifting at first, then increase to lifting as tolerated over the next 6 weeks. Do not "push through" the pain.  Listen to your body and avoid positions and maneuvers than reproduce the pain.  Wait a few days before trying something more intense Walking an hour a day is encouraged to help your body recover faster and more safely.  Start slowly and stop when getting sore.  If you can walk 30 minutes without stopping or pain, you can try more intense activity (running, jogging, aerobics, cycling, swimming, treadmill, sex,  sports, weightlifting, etc.) Remember: If it hurts to do it, then don't do it! Use Ice and/or Heat You will have swelling and bruising around the incisions.  This will take several weeks to resolve. Ice packs or heating pads (6-8 times a day, 30-60 minutes at a time) will help sooth soreness & bruising. Some people prefer to use ice alone, heat alone, or alternate between ice & heat.  Experiment and see what works best for you.  Consider trying ice for the first few days to help decrease swelling and bruising; then, switch to heat to help relax sore spots and speed recovery. Shower every day.  Short baths are fine.  It feels good!  Keep the incisions and wounds clean with soap & water.   Try Gentle Massage and/or Stretching Massage at the area of pain many times a day Stop if you feel pain - do not overdo it Take over the counter pain medication This helps the muscle and nerve tissues become less irritable and calm down faster Choose ONE of the following over-the-counter anti-inflammatory medications: Acetaminophen 562m tabs (Tylenol) 1-2 pills with every meal and just before bedtime (avoid if you have liver problems or if you have acetaminophen in you narcotic prescription) Naproxen 2237mtabs (ex. Aleve, Naprosyn) 1-2 pills twice a day (avoid if you have kidney, stomach, IBD, or bleeding problems) Ibuprofen 20043mabs (ex. Advil, Motrin) 3-4 pills with every meal and just before bedtime (avoid if you have kidney, stomach, IBD, or bleeding problems) Take with food/snack several times a day as directed for at least 2 weeks to help keep pain / soreness down & more manageable. Take Narcotic prescription pain medication for more severe pain A prescription for strong pain control is often given to you upon discharge (for example: oxycodone/Percocet, hydrocodone/Norco/Vicodin, or tramadol/Ultram) Take your pain medication as prescribed. Be mindful that most narcotic prescriptions contain Tylenol  (acetaminophen) as well - avoid taking too much Tylenol. If you are having problems/concerns with  the prescription medicine (does not control pain, nausea, vomiting, rash, itching, etc.), please call us [phone] to see if we need to switch you to a different pain medicine that will work better for you and/or control your side effects better. If you need a refill on your pain medication, you must call the office before 4 pm and on weekdays only.  By federal law, prescriptions for narcotics cannot be called into a pharmacy.  They must be filled out on paper & picked up from our office by the patient or authorized caretaker.  Prescriptions cannot be filled after 4 pm nor on weekends.    WHEN TO CALL us [phone] Severe uncontrolled or worsening pain  Fever over 101 F (38.5 C) Concerns with the incision: Worsening pain, redness, rash/hives, swelling, bleeding, or drainage Reactions / problems with new medications (itching, rash, hives, nausea, etc.) Nausea and/or vomiting Difficulty urinating Difficulty breathing Worsening fatigue, dizziness, lightheadedness, blurred vision Other concerns If you are not getting better after two weeks or are noticing you are getting worse, contact our office [phone] for further advice.  We may need to adjust your medications, re-evaluate you in the office, send you to the emergency room, or see what other things we can do to help. The clinic staff is available to answer your questions during regular business hours (8:30am-5pm).  Please don't hesitate to call and ask to speak to one of our nurses for clinical concerns.    A surgeon from [facility] is always on call at the hospitals 24 hours/day If you have a medical emergency, go to the nearest emergency room or call 911.  FOLLOW UP in our office One the day of your discharge from the hospital (or the next business weekday), please call [facility] to set up or confirm an  appointment to see your surgeon in the office for a follow-up appointment.  Usually it is 2-3 weeks after your surgery.   If you have skin staples at your incision(s), let the office know so we can set up a time in the office for the nurse to remove them (usually around 10 days after surgery). Make sure that you call for appointments the day of discharge (or the next business weekday) from the hospital to ensure a convenient appointment time. IF YOU HAVE DISABILITY OR FAMILY LEAVE FORMS, BRING THEM TO THE OFFICE FOR PROCESSING.  DO NOT GIVE THEM TO YOUR DOCTOR.  [facility] [address] [place], [place], [place]  [address] ? [phone] - Main [phone] - [place],  [phone] - Fax [email or web address]    GETTING TO GOOD BOWEL HEALTH. It is expected for your digestive tract to need a few months to get back to normal.  It is common for your bowel movements and stools to be irregular.  You will have occasional bloating and cramping that should eventually fade away.  Until you are eating solid food normally, off all pain medications, and back to regular activities; your bowels will not be normal.   Avoiding constipation The goal: ONE SOFT BOWEL MOVEMENT A DAY!    Drink plenty of fluids.  Choose water first. TAKE A FIBER SUPPLEMENT EVERY DAY THE REST OF YOUR LIFE During your first week back home, gradually add back a fiber supplement every day Experiment which form you can tolerate.   There are many forms such as powders, tablets, wafers, gummies, etc Psyllium bran (Metamucil), methylcellulose (Citrucel), Miralax or Glycolax,  Benefiber, Flax Seed.  Adjust the dose week-by-week (1/2 dose/day to 6 doses a day) until you are moving your bowels 1-2 times a day.  Cut back the dose or try a different fiber product if it is giving you problems such as diarrhea or bloating. Sometimes a laxative is needed to help jump-start bowels if constipated until the  fiber supplement can help regulate your bowels.  If you are tolerating eating & you are farting, it is okay to try a gentle laxative such as double dose MiraLax, prune juice, or Milk of Magnesia.  Avoid using laxatives too often. Stool softeners can sometimes help counteract the constipating effects of narcotic pain medicines.  It can also cause diarrhea, so avoid using for too long. If you are still constipated despite taking fiber daily, eating solids, and a few doses of laxatives, call our office. Controlling diarrhea Try drinking liquids and eating bland foods for a few days to avoid stressing your intestines further. Avoid dairy products (especially milk & ice cream) for a short time.  The intestines often can lose the ability to digest lactose when stressed. Avoid foods that cause gassiness or bloating.  Typical foods include beans and other legumes, cabbage, broccoli, and dairy foods.  Avoid greasy, spicy, fast foods.  Every person has some sensitivity to other foods, so listen to your body and avoid those foods that trigger problems for you. Probiotics (such as active yogurt, Align, etc) may help repopulate the intestines and colon with normal bacteria and calm down a sensitive digestive tract Adding a fiber supplement gradually can help thicken stools by absorbing excess fluid and retrain the intestines to act more normally.  Slowly increase the dose over a few weeks.  Too much fiber too soon can backfire and cause cramping & bloating. It is okay to try and slow down diarrhea with a few doses of antidiarrheal medicines.   Bismuth subsalicylate (ex. Kayopectate, Pepto Bismol) for a few doses can help control diarrhea.  Avoid if pregnant.   Loperamide (Imodium) can slow down diarrhea.  Start with one tablet (68m) first.  Avoid if you are having fevers or severe pain.  ILEOSTOMY PATIENTS WILL HAVE CHRONIC DIARRHEA since their colon is not in use.    Drink plenty of liquids.  You will need to drink  even more glasses of water/liquid a day to avoid getting dehydrated. Record output from your ileostomy.  Expect to empty the bag every 3-4 hours at first.  Most people with a permanent ileostomy empty their bag 4-6 times at the least.   Use antidiarrheal medicine (especially Imodium) several times a day to avoid getting dehydrated.  Start with a dose at bedtime & breakfast.  Adjust up or down as needed.  Increase antidiarrheal medications as directed to avoid emptying the bag more than 8 times a day (every 3 hours). Work with your wound ostomy nurse to learn care for your ostomy.  See ostomy care instructions. TROUBLESHOOTING IRREGULAR BOWELS 1) Start with a soft & bland diet. No spicy, greasy, or fried foods.  2) Avoid gluten/wheat or dairy products from diet to see if symptoms improve. 3) Miralax 17gm or flax seed mixed in [place] water or juice-daily. May use 2-4 times a day as needed. 4) Gas-X, Phazyme, etc. as needed for gas & bloating.  5) Prilosec (omeprazole) over-the-counter as needed 6)  Consider probiotics (Align, Activa, etc) to help calm the bowels down  Call your doctor if you are getting worse or not getting  better.  Sometimes further testing (cultures, endoscopy, X-ray studies, CT scans, bloodwork, etc.) may be needed to help diagnose and treat the cause of the diarrhea. [facility], [place], [place], [place], [place]  [address] [phone] - Main.    [phone]  - Toll Free.   [phone] - Fax [email or web address]

## 2022-04-17 NOTE — Progress Notes (Signed)
  Progress Note   Patient: Paul Gilmore IPJ:[id number] DOB: February 26, 1976 DOA: 04/11/2022     6 DOS: the patient was seen and examined on 04/17/2022   Brief hospital course: 46 y.o. male with medical history significant for hypertension and recurrent diverticulitis, now presenting to the emergency department for evaluation of left lower quadrant abdominal pain.  The patient reports that he developed left lower quadrant abdominal pain on 04/09/2022, consistent with his prior experiences with sigmoid diverticulitis.  He has had nausea and 1 episode of vomiting near the time of symptom onset.  He had a normal bowel movement on 04/09/2022 and has continued to pass flatus.  Denies melena or hematochezia.   ED Course: Upon arrival to the ED, patient is found to be afebrile and saturating well on room air with elevated blood pressure.  CT of the abdomen and pelvis is notable for diffuse colonic distention with transition point in the mid sigmoid colon where there is wall thickening and surrounding small lymph nodes.  Surgery was consulted   Assessment and Plan: No notes have been filed under this hospital service. Service: Hospitalist 1. 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 Hypertensive urgency  - Presenting BP 170-190/110-120 in ED  -cont with analgesia as needed -Cont PRN labetalol, have also ordered PRN hydralazine -Recently transitioned patient to home Norvasc and home losartan -Cont PRN hydralazine and PRN beta blocker     General exam: Conversant, in no acute distress Respiratory system: normal chest rise, clear, no audible  wheezing Cardiovascular system: regular rhythm, s1-s2 Gastrointestinal system: distended, decreased BS Central nervous system: No seizures, no tremors Extremities: No cyanosis, no joint deformities Skin: No rashes, no pallor Psychiatry: Affect normal // no auditory hallucinations    Physical Exam: Vitals:   04/17/22 0502 04/17/22 0559 04/17/22 0815 04/17/22 0839  BP: (!) 185/101 (!) 167/103 (!) 177/127   Pulse: 98 100 88   Resp: 18  18   Temp: 98.7 F (37.1 C)  97.9 F (36.6 C)   TempSrc: Oral  Oral   SpO2: 95%  95%   Weight:    119.3 kg  Height:    5\' 11"  (1.803 m)    Data Reviewed:  Labs reviewed: Na 134, WBC 11.2   Family Communication: Pt in room, family currently at bedside  Disposition: Status is: Inpatient Remains inpatient appropriate because: Severity of illness   Planned Discharge Destination: Home     Author: , MD 04/17/2022 1:42 PM  For on call review [email or web address].

## 2022-04-17 NOTE — Op Note (Signed)
04/17/2022  1:54 PM  PATIENT:  Paul Gilmore  46 y.o. male  Patient Care Team: [name], MD as PCP - General (Family Medicine) Armbruster, [name], MD as Consulting Physician (Gastroenterology)  PRE-OPERATIVE DIAGNOSIS: RECURRENT DIVERTICULITIS WITH COLON OBSTRUCTION   POST-OPERATIVE DIAGNOSIS:  RECURRENT DIVERTICULITIS WITH COLON OBSTRUCTION   PROCEDURE:   ROBOTIC low anterior RECTOSIGMOID RESECTION WITH END COLOSTOMY ROBOTIC LYSIS OF ADHESIONS X 2 HOURS BLADDER REPAIR TRANSVERSUS ABDOMINIS PLANE (TAP) BLOCK - BILATERAL  SURGEON:  Adin Hector, MD  ASSISTANT: RNFA    ANESTHESIA:     General  Regional TRANSVERSUS ABDOMINIS PLANE (TAP) nerve block for perioperative & postoperative pain control provided with liposomal bupivacaine (Experel) mixed with 0.25% bupivacaine as a Bilateral TAP block x 24mL each side at the level of the transverse abdominis & preperitoneal spaces along the flank at the anterior axillary line, from subcostal ridge to iliac crest under laparoscopic guidance   Local field block at port sites & extraction wound  EBL:  Total I/O In: 2190.2 [I.V.:2000; IV Piggyback:190.2] Out: 550 [Urine:250; Blood:300]  Delay start of Pharmacological VTE agent (>24hrs) due to surgical blood loss or risk of bleeding:  no  DRAINS: 19 Fr Blake drain goes to the pelvis  SPECIMEN:   RECTOSIGMOID COLON (open end proximal)  DISPOSITION OF SPECIMEN:  PATHOLOGY  COUNTS:  YES  PLAN OF CARE: Admit to inpatient   PATIENT DISPOSITION:  PACU - hemodynamically stable.  INDICATION:    Pleasant obese gentleman with recurrent episodes of diverticulitis with numerous hospitalizations.  His hospitalizations had worsening symptoms of bloating and constipation with intermittent obstipation.  Placed on bowel rest with IV antibiotics.  Some equivocal improvement.  Repeat CT scan showed worsening colon distention.  Patient had worsening bloating discomfort.  I recommended  segmental resection.  Main invasive possible open approach given distention.   The anatomy & physiology of the digestive tract was discussed.  The pathophysiology was discussed.  Natural history risks without surgery was discussed.   I worked to give an overview of the disease and the frequent need to have multispecialty involvement.  I feel the risks of no intervention will lead to serious problems that outweigh the operative risks; therefore, I recommended a partial colectomy to remove the pathology.  Laparoscopic & open techniques were discussed.   Risks such as bleeding, infection, abscess, leak, reoperation, possible ostomy, hernia, heart attack, death, and other risks were discussed.  I noted a good likelihood this will help address the problem.   Goals of post-operative recovery were discussed as well.  We will work to minimize complications.  Educational materials on the pathology had been given in the office.  Questions were answered.    The patient expressed understanding & wished to proceed with surgery.  OR FINDINGS:   Patient had very dilated colon and even small intestine with transition point at the rectosigmoid junction.  Patient had extremely dense concrete adhesions of twisted rectosigmoid colon to the midline retroperitoneum as well as the dome of the bladder and right and left lateral pelvis  Prolene sutures placed on the rectal stump which is at the proximal/mid rectal junction No obvious metastatic disease on visceral parietal peritoneum or liver.    CASE DATA:  Type of patient?: LDOW CASE (Surgical Hospitalist [facility] Inpatient)  Status of Case? URGENT Add On  Infection Present At Time Of Surgery (PATOS)?  PHLEGMON  DESCRIPTION:   Informed consent was confirmed.  The patient underwent general anaesthesia without difficulty.  The patient was positioned appropriately.  VTE prevention in place.  The patient was clipped, prepped, & draped in a sterile fashion.  Surgical  timeout confirmed our plan.  The patient was positioned in reverse Trendelenburg.  Abdominal entry was gained using Varess technique at the left subcostal ridge on the anterior abdominal wall.  No elevated EtCO2 noted.  Port placed.  Camera inspection revealed no injury.  Extra ports were carefully placed under direct laparoscopic visualization.  Upon entering the abdomen (organ space), I encountered massive small bowel and colonic distention.  However we decided to start and see how much mobilization surgery we could do robotically.  Unable to reflect the small bowel or great omentum out of the pelvis.  The Intuitive daVinci robot was docked with camera & instruments carefully placed.  I was able to find the sigmoid colon and follow it down.  It looks like it had done a clinic quite as corkscrew turn down to the retroperitoneum.  The border with the anterior lateral pelvis was obliterated with dense concrete adhesions.  Too stuck to do a medial lateral retromesenteric approach initially.  Therefore I mobilized the descending colon and proximal sigmoid colon a lateral medial fashion to get into the retromesenteric plane.  Freed the descending colon off of some Gerota's fascia although this was inflamed and somewhat stuck as well.  Came down distally along the left paracolic gutter to the left lateral pelvis.  Close to the rectosigmoid colon to avoid any injury to retroperitoneal structures.  Very thick dense adhesions.  I then alternated with try and find a window in the right lateral pelvis but that was poor as well.  Work to try and come around the anterior pelvic rim but it was quite apparent the bladder was quite stuck to it as well.  Had an alternate between vessel sealer's as well as sharp scissor dissection robotically.  With enough mobility I was able to elevate the rectosigmoid colon anteriorly to expose the mesentery.  I transected along the visceral peritoneum of the rectosigmoid mesentery on the  right side towards the pelvic gutter.  Try to elevate visualization was fair.  I therefore went higher up on the mesentery and focused on skeletonizing along the rectosigmoid colon.  Eventually freed some adhesions of the ileocecal mesentery to the rectosigmoid mesentery.  Freed off dense adhesions of the rectosigmoid off the rectosigmoid mesentery on the right side to help elevate and straighten it out somewhat.  With this, I was able to find a more clean window in the right posterior pelvic brim.  It came around and could thin out between the rectosigmoid colon and the dome of the bladder.  Worked gradually in layers.  Alternated from the left side as well.  Eventually was able to free off the dome of the bladder off the very inflamed rectosigmoid colon where the stricture was out.  It did twist itself in a clockwise fashion and a corkscrew fashion towards the right retroperitoneum.  However able to gradually come through the thickened visceral peritoneal layers and rind to untwist and straighten out and elevate it.  Of note Dr. Redmond Pulling & Dr. Rosendo Gros came in during surgery  There was a rather thickened rind on the bladder but I wanted to be safe and I asked the surgery team to insufflate the bladder with methylene blue stained fluid.  Got distention of 350 mL to get excellent bladder distention improve that there was no colovesical fistula nor cystotomy.  However given the  inflammation I decided to oversew the margin of the bladder repair and using 2 OV lock absorbable serrated suture starting from each corner on the right and left lateral domes of the bladder and coming at the midline.  Stayed away from the ureters.  I was able to find a window in the descending colon mesentery off the retroperitoneum Mansouraty's fascia.  Follow that more distally and eventually was able to transect the sigmoid and rectosigmoid mesentery close to the colon and rectum.  It was quite apparent it was twisted and contracted and  folded upon itself.  Eventually connected and it was able to get into the presacral mesorectal plane which was much softer and less inflamed.  Freed the proximal and mid rectal mesorectum off that plane.  With tha.t, we could can better pull rectosigmoid and rectum out of the pelvis.  I was able to find a window at the proximal/mid rectal junction that was not inflamed.  I did have to go more distally given the significant inflammation and stricturing at the rectosigmoid junction.  Chose a location and transected through the mesorectum.  I worked to mobilize the descending colon a lateral medial fashion up towards the splenic flexure.  I held off on being too aggressive on the more proximal mesentery and try and preserve a marginal artery to the descending colon which was rather dilated.  There is no evidence of any colonic ischemia in the cecum colon or elsewhere.  No evidence of perforation.  Small bowel distended but healthy and not injured.  We had anesthesia switch from orogastric to nasogastric tube given the fact there was significant small bowel distention as well.   I skeletonized the mesorectum and transected at the mid rectum using a robotic stapler vertically.  Oversewed the corners with 2-0 Prolene suture with long tails for identification of the future.  I confirmed we had had good mobility of the descending colon up to the splenic flexure like a reach across midline and the left superior paramedian premarked colostomy site where we had placed the staple port through.  Despite its massive distention and his obesity, I felt it should be able to reach.  Placed a surgical drain given the inflammation and phlegmon in bladder layer repair.r our planned anastomosis.  Transected the colon mesentery radially to preserve good collateral and marginal artery blood supply.  We created an extraction incision through the left upper paramedian premarked colostomy site that would put the staple port through.   Excised a 3 cm disc of skin and dermis and subcutaneous fat.  Opened up the anterior rectus fascia transversely, split through the rectus muscle and came through the posterior rectus fascia and preperitoneum vertically.  Placed a wound protector.  There is still some thickening so I did have to release the anterior fascia and rectus muscle somewhat but eventually was able to eviscerate the rectosigmoid and descending colon out the wound.   Came to area that seemed viable not frankly necrotic nor ischemic.  We secured the drain site with Prolene suture.  We have echocardiac side and closed the port sites with 4 Monocryl.  Sterile dressings applied.  I matured the colostomy in a Brooke fashion using interrupted 2-0 and 3-0 Vicryl suture.  Distal mucosa was rather ischemic site trimmed back another centimeter to much less ischemic tissue.  Inspection of the colon going through the abdominal wall was more viable.  I said not to be too aggressive on taking more colon since he was very  obese and I did not want to limit reach.  Given his massive colon since I placed a large Malecot tube into the colostomy so that the tip will be subfascial and allow this liquid stool and gas to evacuate more easily.  Appliance placed.  Patient is being extubated go to recovery room. I had discussed postop care with the patient in detail the office & in the holding area. Instructions are written. I discussed operative findings, updated the patient's status, discussed probable steps to recovery, and gave postoperative recommendations to the patient's spouse, Mykel Faron.  She had been updated during the case earlier as well.  I noted operative findings and procedure.  Recommendations were made.  Questions were answered.  She expressed understanding & appreciation.  Adin Hector, M.D., F.A.C.S. Gastrointestinal and Minimally Invasive Surgery [facility] [address] [place], [place]  [id number] [phone] Main / Paging

## 2022-04-17 NOTE — Anesthesia Postprocedure Evaluation (Signed)
Anesthesia Post Note  Patient: TYLIN FORCE  Procedure(s) Performed: ROBOTIC LOW ANTERIOR  RECTOSIGMOID RESECTION WITH END COLOSTOMY, ROBOTIC LYSIS OF ADHESIONS, BLADDER REPAIR, BILATERAL TAP BLOCK TRANSVERSUS ABDOMINIS PLANE (TAP) BLOCK - BILATERAL     Patient location during evaluation: PACU Anesthesia Type: General Level of consciousness: awake and alert Pain management: pain level controlled Vital Signs Assessment: post-procedure vital signs reviewed and stable Respiratory status: spontaneous breathing, nonlabored ventilation, respiratory function stable and patient connected to nasal cannula oxygen Cardiovascular status: blood pressure returned to baseline and stable Postop Assessment: no apparent nausea or vomiting Anesthetic complications: no   No notable events documented.  Last Vitals:  Vitals:   04/17/22 1530 04/17/22 1545  BP: (!) 161/104 (!) 155/92  Pulse: (!) 116 (!) 112  Resp: 17 12  Temp:    SpO2: 97% 96%    Last Pain:  Vitals:   04/17/22 1545  TempSrc:   PainSc: Asleep                 Carmilla Granville S

## 2022-04-17 NOTE — Transfer of Care (Signed)
Immediate Anesthesia Transfer of Care Note  Patient: SIRIUS WOODFORD  Procedure(s) Performed: XI ROBOT ASSISTED [facility] PROCEDURE  Patient Location: PACU  Anesthesia Type:General  Level of Consciousness: awake, alert , oriented and patient cooperative  Airway & Oxygen Therapy: Patient Spontanous Breathing and Patient connected to face mask  Post-op Assessment: Report given to RN and Post -op Vital signs reviewed and stable  Post vital signs: Reviewed and stable  Last Vitals:  Vitals Value Taken Time  BP 167/119 04/17/22 1352  Temp    Pulse 95 04/17/22 1356  Resp    SpO2 97 % 04/17/22 1356  Vitals shown include unvalidated device data.  Last Pain:  Vitals:   04/17/22 0815  TempSrc: Oral  PainSc:       Patients Stated Pain Goal: 2 (04/17/22 0754)  Complications: No notable events documented.

## 2022-04-17 NOTE — Anesthesia Procedure Notes (Signed)
Procedure Name: Intubation Date/Time: 04/17/2022 9:10 AM Performed by: Claudia Desanctis, CRNA Pre-anesthesia Checklist: Patient identified, Emergency Drugs available, Suction available and Patient being monitored Patient Re-evaluated:Patient Re-evaluated prior to induction Oxygen Delivery Method: Circle system utilized Preoxygenation: Pre-oxygenation with 100% oxygen Induction Type: IV induction, Rapid sequence and Cricoid Pressure applied Ventilation: Mask ventilation without difficulty Laryngoscope Size: 2 and Miller Grade View: Grade I Tube type: Oral Tube size: 7.5 mm Number of attempts: 1 Airway Equipment and Method: Stylet Placement Confirmation: ETT inserted through vocal cords under direct vision, positive ETCO2 and breath sounds checked- equal and bilateral Secured at: 24 cm Tube secured with: Tape Dental Injury: Teeth and Oropharynx as per pre-operative assessment

## 2022-04-17 NOTE — Consult Note (Addendum)
[facility] Nurse requested for preoperative stoma site marking; pt was already in the presurgical holding area on a stretcher and could not sit or stand to assess abd contours. Abd appears tight and distended and folds may appear after surgery which are currently not visible.  Discussed surgical procedure and stoma creation with patient Explained role of the [facility] nurse team.  Provided the patient with educational booklet and provided samples of pouching options.  Answered patient's questions.   Examined patient lying, in order to place the marking in the patient's visual field, away from any creases or abdominal contour issues and within the rectus muscle.  Attempted to mark below the patient's belt line, but this was not possible, since the patient wears his pants low around the hips and there is a crease which occurs if he was sitting forward which should be avoided if possible.    Marked for colostomy in the LLQ  __5__ cm to the left of the umbilicus and [id number] above the umbilicus.  Marked for ileostomy in the RLQ  __5__cm to the right of the umbilicus and  [id number] cm above the umbilicus.  [facility] Nurse team will follow up with patient after surgery for continued ostomy care and teaching.  Julien Girt MSN, RN, [place], [place], [place]

## 2022-04-17 NOTE — Progress Notes (Signed)
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 trial.  CT scan shows worsening colonic distention.  No evidence of perforation or pneumatosis.  I think we have exhausted nonoperative options and he is progressed to complete obstruction.  I think he requires surgery.  Given his distention, morbid obesity, male gender; his risk of leak is very high so I would plan Hartmann resection with colostomy.  And hopefully can do colostomy takedown in 3-6 months depending on his recovery.  While they are disappointed things do not turn around he agrees that he is feeling worse and we have exhausted options and agrees to proceed with surgery.  We will try minimally invasive approach since small bowel does not seem to be severely distended.  Robot available.  See if it is possible get colostomy marking given his obesity to minimize stomal issues in the  future  The anatomy & physiology of the digestive tract was discussed.  The pathophysiology of the colon was discussed.  Natural history risks without surgery was discussed.   I feel the risks of no intervention will lead to serious problems that outweigh the operative risks; therefore, I recommended a partial colectomy to remove the pathology.  Probable colostomy given colonic distention and obstruction in the setting of recurrent diverticulitis.  Minimally invasive (Robotic/Laparoscopic) & open techniques were discussed.   Risks such as bleeding, infection, abscess, leak, reoperation, injury to other organs, need for repair of tissues / organs, possible ostomy, hernia, heart attack, stroke, death, and other risks were discussed.  I noted a good likelihood this will help address the problem.   Goals of post-operative recovery were discussed as well.   Need for adequate nutrition, daily bowel regimen and healthy physical activity, to optimize recovery was noted as well. We will work to minimize complications.  Educational materials were available as well.  Questions were answered.  The patient expresses understanding & wishes to proceed with surgery.   -Gout, hypertension, other medical issues per primary service -VTE prophylaxis- SCDs, etc -mobilize as tolerated to help recovery  Disposition: TBD -suspected will be late May   I updated the patient's status to the patient and spouse  Recommendations were made.  Questions were answered.  They expressed understanding & appreciation.    I reviewed hospitalist notes, last 24 h vitals and pain scores, last 48 h intake and output, last 24 h labs and trends, and last 24 h imaging results. I have reviewed this patient's available data, including medical history,  events of note, test results, etc as part of my evaluation.  A significant portion of that time was spent in counseling.  Care during the described time interval was provided by me.  This care  required moderate level of medical decision making.  04/17/2022    Subjective: (Chief complaint)  CT scan done last evening.  Patient feeling bloated even with clear liquids.  More cramping.  Not having flatus anymore.  Wife in room  Objective:  Vital signs:  Vitals:   04/16/22 1344 04/16/22 2126 04/17/22 0502 04/17/22 0559  BP: (!) 191/100 (!) 162/101 (!) 185/101 (!) 167/103  Pulse: 77 92 98 100  Resp: 20 18 18    Temp: 98.6 F (37 C) 99 F (37.2 C) 98.7 F (37.1 C)   TempSrc: Oral Oral Oral   SpO2: 95% 93% 95%   Weight:      Height:        Last BM Date : 04/09/22  Intake/Output   Yesterday:  05/22 0701 - 05/23 0700 In: 2330.7 [P.O.:290; I.V.:1236.8; IV Piggyback:803.9] Out: 2225 [Urine:2225] This shift:  No intake/output data recorded.  Bowel function:  Flatus: No  BM:  No  Drain: (No drain)   Physical Exam:  General: Pt awake/alert in no acute distress Eyes: PERRL, normal EOM.  Sclera clear.  No icterus Neuro: CN II-XII intact w/o focal sensory/motor deficits. Lymph: No head/neck/groin lymphadenopathy Psych:  No delerium/psychosis/paranoia.  Oriented x 4 HENT: Normocephalic, Mucus membranes moist.  No thrush Neck: Supple, No tracheal deviation.  No obvious thyromegaly Chest: No pain to chest wall compression.  Good respiratory excursion.  No audible wheezing CV:  Pulses intact.  Regular rhythm.  No major extremity edema MS: Normal AROM mjr joints.  No obvious deformity  Abdomen: Morbidly Obese but Soft.  Moderately distended.  Tenderness at LLQ .  No evidence of peritonitis.  No incarcerated hernias.  Ext:   No deformity.  No mjr edema.  No cyanosis Skin: No petechiae / purpurea.  No major sores.  Warm and dry    Results:   Cultures: Recent Results (from the past 720 hour(s))  Resp Panel by RT-PCR (Flu A&B, Covid) Nasopharyngeal Swab     Status: None   Collection Time: 04/11/22  9:31 PM   Specimen: Nasopharyngeal Swab; Nasopharyngeal(NP)  swabs in vial transport medium  Result Value Ref Range Status   SARS Coronavirus 2 by RT PCR NEGATIVE NEGATIVE Final    Comment: (NOTE) SARS-CoV-2 target nucleic acids are NOT DETECTED.  The SARS-CoV-2 RNA is generally detectable in upper respiratory specimens during the acute phase of infection. The lowest concentration of SARS-CoV-2 viral copies this assay can detect is 138 copies/mL. A negative result does not preclude SARS-Cov-2 infection and should not be used as the sole basis for treatment or other patient management decisions. A negative result may occur with  improper specimen collection/handling, submission of specimen other than nasopharyngeal swab, presence of viral mutation(s) within the areas targeted by this assay, and inadequate number of viral copies(<138 copies/mL). A negative result must be combined with clinical observations, patient history, and epidemiological information. The expected result is Negative.  Fact Sheet for Patients:  04/13/22  Fact Sheet for Healthcare Providers:  BloggerCourse.com  This test is no t yet approved or cleared by the [place] FDA and  has been authorized for detection and/or diagnosis of SARS-CoV-2 by FDA under an Emergency Use Authorization (EUA). This EUA will remain  in effect (meaning this test can be used) for  the duration of the COVID-19 declaration under Section 564(b)(1) of the Act, 21 U.S.C.section 360bbb-3(b)(1), unless the authorization is terminated  or revoked sooner.       Influenza A by PCR NEGATIVE NEGATIVE Final   Influenza B by PCR NEGATIVE NEGATIVE Final    Comment: (NOTE) The Xpert Xpress SARS-CoV-2/FLU/RSV plus assay is intended as an aid in the diagnosis of influenza from Nasopharyngeal swab specimens and should not be used as a sole basis for treatment. Nasal washings and aspirates are unacceptable for Xpert Xpress  SARS-CoV-2/FLU/RSV testing.  Fact Sheet for Patients: BloggerCourse.com  Fact Sheet for Healthcare Providers: SeriousBroker.it  This test is not yet approved or cleared by the [place] FDA and has been authorized for detection and/or diagnosis of SARS-CoV-2 by FDA under an Emergency Use Authorization (EUA). This EUA will remain in effect (meaning this test can be used) for the duration of the COVID-19 declaration under Section 564(b)(1) of the Act, 21 U.S.C. section 360bbb-3(b)(1), unless the authorization is terminated or revoked.  Performed at [facility], [address], [place], [place] [address]     Labs: Results for orders placed or performed during the hospital encounter of 04/11/22 (from the past 48 hour(s))  Comprehensive metabolic panel     Status: Abnormal   Collection Time: 04/16/22  4:13 AM  Result Value Ref Range   Sodium 136 135 - 145 mmol/L   Potassium 4.7 3.5 - 5.1 mmol/L   Chloride 98 98 - 111 mmol/L   CO2 28 22 - 32 mmol/L   Glucose, Bld 104 (H) 70 - 99 mg/dL    Comment: Glucose reference range applies only to samples taken after fasting for at least 8 hours.   BUN 11 6 - 20 mg/dL   Creatinine, Ser 9.15 0.61 - 1.24 mg/dL   Calcium 9.0 8.9 - 05.6 mg/dL   Total Protein 6.8 6.5 - 8.1 g/dL   Albumin 3.7 3.5 - 5.0 g/dL   AST 18 15 - 41 U/L   ALT 16 0 - 44 U/L   Alkaline Phosphatase 64 38 - 126 U/L   Total Bilirubin 1.0 0.3 - 1.2 mg/dL   GFR, Estimated >97 >94 mL/min    Comment: (NOTE) Calculated using the CKD-EPI Creatinine Equation (2021)    Anion gap 10 5 - 15    Comment: Performed at [facility], [address] [place], [place] [address]  CBC     Status: Abnormal   Collection Time: 04/16/22  4:13 AM  Result Value Ref Range   WBC 11.8 (H) 4.0 - 10.5 K/uL   RBC 5.62 4.22 - 5.81 MIL/uL   Hemoglobin 17.6 (H) 13.0 - 17.0 g/dL   HCT 53.7 48.2 - 70.7 %   MCV  88.6 80.0 - 100.0 fL   MCH 31.3 26.0 - 34.0 pg   MCHC 35.3 30.0 - 36.0 g/dL   RDW 86.7 54.4 - 92.0 %   Platelets 312 150 - 400 K/uL   nRBC 0.0 0.0 - 0.2 %    Comment: Performed at [facility], [address] [place], [place] [address]  Comprehensive metabolic panel     Status: Abnormal   Collection Time: 04/17/22  4:06 AM  Result Value Ref Range   Sodium 134 (L) 135 - 145 mmol/L   Potassium 4.2 3.5 - 5.1 mmol/L   Chloride 98 98 - 111 mmol/L   CO2 25 22 - 32 mmol/L   Glucose, Bld 103 (H) 70 -  99 mg/dL    Comment: Glucose reference range applies only to samples taken after fasting for at least 8 hours.   BUN 11 6 - 20 mg/dL   Creatinine, Ser 1.610.93 0.61 - 1.24 mg/dL   Calcium 9.0 8.9 - 09.610.3 mg/dL   Total Protein 6.6 6.5 - 8.1 g/dL   Albumin 3.4 (L) 3.5 - 5.0 g/dL   AST 17 15 - 41 U/L   ALT 20 0 - 44 U/L   Alkaline Phosphatase 62 38 - 126 U/L   Total Bilirubin 1.1 0.3 - 1.2 mg/dL   GFR, Estimated >04>60 >54>60 mL/min    Comment: (NOTE) Calculated using the CKD-EPI Creatinine Equation (2021)    Anion gap 11 5 - 15    Comment: Performed at [facility], [address] [place], [place] [address]  CBC     Status: Abnormal   Collection Time: 04/17/22  4:06 AM  Result Value Ref Range   WBC 11.2 (H) 4.0 - 10.5 K/uL   RBC 5.27 4.22 - 5.81 MIL/uL   Hemoglobin 16.8 13.0 - 17.0 g/dL   HCT 91.447.6 78.239.0 - 95.652.0 %   MCV 90.3 80.0 - 100.0 fL   MCH 31.9 26.0 - 34.0 pg   MCHC 35.3 30.0 - 36.0 g/dL   RDW 21.312.2 08.611.5 - 57.815.5 %   Platelets 335 150 - 400 K/uL   nRBC 0.0 0.0 - 0.2 %    Comment: Performed at [facility], [address] [place], [place] [address]    Imaging / Studies: CT ABDOMEN PELVIS W CONTRAST  Result Date: 04/16/2022 CLINICAL DATA:  Abdominal pain EXAM: CT ABDOMEN AND PELVIS WITH CONTRAST TECHNIQUE: Multidetector CT imaging of the abdomen and pelvis was performed using the standard protocol following bolus administration of  intravenous contrast. RADIATION DOSE REDUCTION: This exam was performed according to the departmental dose-optimization program which includes automated exposure control, adjustment of the mA and/or kV according to patient size and/or use of iterative reconstruction technique. CONTRAST:  100mL OMNIPAQUE IOHEXOL 300 MG/ML  SOLN COMPARISON:  Previous studies including the CT done on 04/11/2022 FINDINGS: Lower chest: Small linear densities in the right lower lung fields may suggest subsegmental atelectasis. Hepatobiliary: No focal abnormality is seen in the liver. There is no dilation of bile ducts. Gallbladder is distended without wall thickening. This may be due to fasting state of the patient. Pancreas: No focal abnormality is seen. Spleen: Unremarkable. Adrenals/Urinary Tract: Adrenals are unremarkable. There is no hydronephrosis. There are no renal or ureteral stones. Urinary bladder is not distended. There is mild diffuse wall thickening in the bladder which may be due to incomplete distention. Less likely possibility would be cystitis. Stomach/Bowel: Stomach is not distended. There is mild dilation of distal small bowel loops measuring up torr 4 cm. Appendix is essentially unremarkable. There is air in the lumen of appendix. There is dilation of colon with interval worsening. Maximum diameter of dilated trans colon measures 9.5 cm. Sigmoid colon immediately proximal to the zone of transition measures 8 cm. There is wall thickening in the sigmoid at the level of transition. Scattered diverticula are seen in the colon without signs of focal acute diverticulitis. Distal sigmoid colon and rectum are not distended. There are few slightly enlarged lymph nodes adjacent to the sigmoid colon largest measuring 1.2 cm in diameter. Vascular/Lymphatic: Vascular structures are unremarkable. Reproductive: There are coarse calcifications in the prostate. Other: There is interval appearance of small ascites. There is no  pneumoperitoneum.  Umbilical hernia containing fat is seen. Small right inguinal hernia containing fat is seen. Musculoskeletal: There is 50% decrease in height of body of T12 vertebra with no significant change. IMPRESSION: There is significant interval worsening of colonic distention without dilation of distal sigmoid. There is possible wall thickening in the sigmoid colon at the level of transition. Findings suggest possible obstruction in the sigmoid colon due to stricture or malignant neoplasm. There are slightly enlarged lymph nodes adjacent to sigmoid colon measuring up to 1.2 cm which may suggest reactive hyperplasia or neoplastic process. Endoscopic correlation should be considered. There is interval worsening of distal small bowel dilation. There is interval appearance of small ascites. There is no pneumoperitoneum. Linear densities in the right lower lung fields suggest subsegmental atelectasis. Other findings as described in the body of the report. Electronically Signed   By: Ernie Avena M.D.   On: 04/16/2022 15:15    Medications / Allergies: per chart  Antibiotics: Anti-infectives (From admission, onward)    Start     Dose/Rate Route Frequency Ordered Stop   04/17/22 0845  cefoTEtan (CEFOTAN) 2 g in sodium chloride 0.9 % 100 mL IVPB        2 g 200 mL/hr over 30 Minutes Intravenous On call to O.R. 04/17/22 0751 04/18/22 0559   04/14/22 1800  piperacillin-tazobactam (ZOSYN) IVPB 3.375 g       Note to Pharmacy: Pharmacy may adjust dosing strength, schedule, rate of infusion, etc as needed to optimize therapy   3.375 g 12.5 mL/hr over 240 Minutes Intravenous Every 8 hours 04/14/22 1037     04/13/22 0900  piperacillin-tazobactam (ZOSYN) IVPB 3.375 g  Status:  Discontinued        3.375 g 12.5 mL/hr over 240 Minutes Intravenous Every 8 hours 04/13/22 0814 04/14/22 1037   04/11/22 2145  cefTRIAXone (ROCEPHIN) 2 g in sodium chloride 0.9 % 100 mL IVPB  Status:  Discontinued        2  g 200 mL/hr over 30 Minutes Intravenous Every 24 hours 04/11/22 2143 04/13/22 0814   04/11/22 2145  metroNIDAZOLE (FLAGYL) IVPB 500 mg  Status:  Discontinued        500 mg 100 mL/hr over 60 Minutes Intravenous Every 12 hours 04/11/22 2143 04/13/22 1610         Note: Portions of this report may have been transcribed using voice recognition software. Every effort was made to ensure accuracy; however, inadvertent computerized transcription errors may be present.   Any transcriptional errors that result from this process are unintentional.    Ardeth Sportsman, MD, FACS, MASCRS Esophageal, Gastrointestinal & Colorectal Surgery Robotic and Minimally Invasive Surgery  [facility]  [facility]  [address], [address] [place], [place] [address] [phone] Fax [phone] Main  CONTACT INFORMATION:  Weekday (9AM-5PM): Call [facility] main office at [phone]  Weeknight (5PM-9AM) or Weekend/Holiday: Check [email or web address] (password " [email or web address]") for General Surgery [facility] coverage  (Please, do not use SecureChat as it is not reliable communication to operating surgeons for immediate patient care)      04/17/2022  7:52 AM

## 2022-04-17 NOTE — Anesthesia Preprocedure Evaluation (Addendum)
Anesthesia Evaluation  Patient identified by MRN, date of birth, ID band Patient awake    Reviewed: Allergy & Precautions, NPO status , Patient's Chart, lab work & pertinent test results  Airway Mallampati: II  TM Distance: >3 FB Neck ROM: Full    Dental  (+) Dental Advisory Given   Pulmonary neg pulmonary ROS,    breath sounds clear to auscultation       Cardiovascular hypertension, Pt. on medications  Rhythm:Regular Rate:Normal     Neuro/Psych negative neurological ROS     GI/Hepatic Neg liver ROS, GERD  ,  Endo/Other  negative endocrine ROS  Renal/GU negative Renal ROS     Musculoskeletal  (+) Arthritis ,   Abdominal   Peds  Hematology negative hematology ROS (+)   Anesthesia Other Findings   Reproductive/Obstetrics                             Lab Results  Component Value Date   WBC 11.2 (H) 04/17/2022   HGB 16.8 04/17/2022   HCT 47.6 04/17/2022   MCV 90.3 04/17/2022   PLT 335 04/17/2022   Lab Results  Component Value Date   CREATININE 0.93 04/17/2022   BUN 11 04/17/2022   NA 134 (L) 04/17/2022   K 4.2 04/17/2022   CL 98 04/17/2022   CO2 25 04/17/2022    Anesthesia Physical Anesthesia Plan  ASA: 3 and emergent  Anesthesia Plan: General   Post-op Pain Management: Tylenol PO (pre-op)*, Ketamine IV*, Celebrex PO (pre-op)* and Gabapentin PO (pre-op)*   Induction: Intravenous and Rapid sequence  PONV Risk Score and Plan: 3 and Dexamethasone, Ondansetron, Midazolam and Treatment may vary due to age or medical condition  Airway Management Planned: Oral ETT  Additional Equipment: None  Intra-op Plan:   Post-operative Plan: Extubation in OR  Informed Consent: I have reviewed the patients History and Physical, chart, labs and discussed the procedure including the risks, benefits and alternatives for the proposed anesthesia with the patient or authorized representative  who has indicated his/her understanding and acceptance.     Dental advisory given  Plan Discussed with: CRNA  Anesthesia Plan Comments:        Anesthesia Quick Evaluation

## 2022-04-18 LAB — COMPREHENSIVE METABOLIC PANEL
ALT: 31 U/L (ref 0–44)
AST: 35 U/L (ref 15–41)
Albumin: 2.9 g/dL — ABNORMAL LOW (ref 3.5–5.0)
Alkaline Phosphatase: 50 U/L (ref 38–126)
Anion gap: 12 (ref 5–15)
BUN: 18 mg/dL (ref 6–20)
CO2: 23 mmol/L (ref 22–32)
Calcium: 8.4 mg/dL — ABNORMAL LOW (ref 8.9–10.3)
Chloride: 99 mmol/L (ref 98–111)
Creatinine, Ser: 1.02 mg/dL (ref 0.61–1.24)
GFR, Estimated: >60 mL/min (ref >60)
Glucose, Bld: 128 mg/dL — ABNORMAL HIGH (ref 70–99)
Potassium: 4.4 mmol/L (ref 3.5–5.1)
Sodium: 134 mmol/L — ABNORMAL LOW (ref 135–145)
Total Bilirubin: 1.1 mg/dL (ref 0.3–1.2)
Total Protein: 5.8 g/dL — ABNORMAL LOW (ref 6.5–8.1)

## 2022-04-18 LAB — CBC
HCT: 42.6 % (ref 39.0–52.0)
Hemoglobin: 14.9 g/dL (ref 13.0–17.0)
MCH: 31.7 pg (ref 26.0–34.0)
MCHC: 35 g/dL (ref 30.0–36.0)
MCV: 90.6 fL (ref 80.0–100.0)
Platelets: 334 10*3/uL (ref 150–400)
RBC: 4.7 MIL/uL (ref 4.22–5.81)
RDW: 12.7 % (ref 11.5–15.5)
WBC: 13.2 10*3/uL — ABNORMAL HIGH (ref 4.0–10.5)
nRBC: 0 % (ref 0.0–0.2)

## 2022-04-18 MED ORDER — ACETAMINOPHEN 10 MG/ML IV SOLN
1000.0000 mg | Freq: Four times a day (QID) | INTRAVENOUS | Status: AC
  Administered 2022-04-18 – 2022-04-19 (×4): 1000 mg via INTRAVENOUS
  Filled 2022-04-18 (×4): qty 100

## 2022-04-18 NOTE — Progress Notes (Signed)
[facility] Progress Note  1 Day Post-Op  Subjective: Feels like he has been hit by a train.  Already got up and walked last night.  NGT cannister just changed from being full.  Ostomy working well   Objective: Vital signs in last 24 hours: Temp:  [97.6 F (36.4 C)-98.1 F (36.7 C)] 97.7 F (36.5 C) (05/24 0450) Pulse Rate:  [96-141] 101 (05/24 0450) Resp:  [12-20] 19 (05/24 0450) BP: (122-180)/(75-122) 143/91 (05/24 0450) SpO2:  [95 %-100 %] 99 % (05/24 0450) Last BM Date : 04/18/22  Intake/Output from previous day: 05/23 0701 - 05/24 0700 In: 2867.6 [P.O.:150; I.V.:2076.2; IV Piggyback:641.4] Out: 3895 [Urine:1600; Emesis/NG output:550; Drains:745; Stool:700; Blood:300] Intake/Output this shift: Total I/O In: -  Out: 470 [Urine:150; Drains:20; Stool:300]  PE: Abd: Soft, appropriately tender, incisions are clean and covered, JP with serosang output.  Colostomy covered in thick pudding like stool.  Unable to see stoma or malecot tube currently.   Lab Results:  Recent Labs    04/17/22 0406 04/18/22 0422  WBC 11.2* 13.2*  HGB 16.8 14.9  HCT 47.6 42.6  PLT 335 334   BMET Recent Labs    04/17/22 0406 04/18/22 0422  NA 134* 134*  K 4.2 4.4  CL 98 99  CO2 25 23  GLUCOSE 103* 128*  BUN 11 18  CREATININE 0.93 1.02  CALCIUM 9.0 8.4*   PT/INR No results for input(s): LABPROT, INR in the last 72 hours. CMP     Component Value Date/Time   NA 134 (L) 04/18/2022 0422   K 4.4 04/18/2022 0422   CL 99 04/18/2022 0422   CO2 23 04/18/2022 0422   GLUCOSE 128 (H) 04/18/2022 0422   BUN 18 04/18/2022 0422   CREATININE 1.02 04/18/2022 0422   CALCIUM 8.4 (L) 04/18/2022 0422   PROT 5.8 (L) 04/18/2022 0422   ALBUMIN 2.9 (L) 04/18/2022 0422   AST 35 04/18/2022 0422   ALT 31 04/18/2022 0422   ALKPHOS 50 04/18/2022 0422   BILITOT 1.1 04/18/2022 0422   GFRNONAA >60 04/18/2022 0422   GFRAA >60 10/30/2018 1059   Lipase     Component Value Date/Time    LIPASE 23 19-Apr-2022 1547       Studies/Results: DG Abd 1 View  Result Date: 04/17/2022 CLINICAL DATA:  NG tube placement EXAM: ABDOMEN - 1 VIEW COMPARISON:  04/13/2022 FINDINGS: Tip of enteric tube is seen in the course of thoracic esophagus slightly below the level of the carina. There is gaseous dilation of bowel loops. Abdomen is not included in its entirety limiting evaluation. IMPRESSION: Tip of enteric tube is seen in the thoracic esophagus. It should be advanced approximately 15 cm to place the tip and side port within the stomach. Electronically Signed   By: Ernie Avena M.D.   On: 04/17/2022 15:50   CT ABDOMEN PELVIS W CONTRAST  Result Date: 04/16/2022 CLINICAL DATA:  Abdominal pain EXAM: CT ABDOMEN AND PELVIS WITH CONTRAST TECHNIQUE: Multidetector CT imaging of the abdomen and pelvis was performed using the standard protocol following bolus administration of intravenous contrast. RADIATION DOSE REDUCTION: This exam was performed according to the departmental dose-optimization program which includes automated exposure control, adjustment of the mA and/or kV according to patient size and/or use of iterative reconstruction technique. CONTRAST:  OMNIPAQUE IOHEXOL 300 MG/ML  SOLN COMPARISON:  Previous studies including the CT done on 04/19/22 FINDINGS: Lower chest: Small linear densities in the right lower lung fields may suggest subsegmental atelectasis. Hepatobiliary:  No focal abnormality is seen in the liver. There is no dilation of bile ducts. Gallbladder is distended without wall thickening. This may be due to fasting state of the patient. Pancreas: No focal abnormality is seen. Spleen: Unremarkable. Adrenals/Urinary Tract: Adrenals are unremarkable. There is no hydronephrosis. There are no renal or ureteral stones. Urinary bladder is not distended. There is mild diffuse wall thickening in the bladder which may be due to incomplete distention. Less likely possibility would be  cystitis. Stomach/Bowel: Stomach is not distended. There is mild dilation of distal small bowel loops measuring up torr 4 cm. Appendix is essentially unremarkable. There is air in the lumen of appendix. There is dilation of colon with interval worsening. Maximum diameter of dilated trans colon measures 9.5 cm. Sigmoid colon immediately proximal to the zone of transition measures 8 cm. There is wall thickening in the sigmoid at the level of transition. Scattered diverticula are seen in the colon without signs of focal acute diverticulitis. Distal sigmoid colon and rectum are not distended. There are few slightly enlarged lymph nodes adjacent to the sigmoid colon largest measuring 1.2 cm in diameter. Vascular/Lymphatic: Vascular structures are unremarkable. Reproductive: There are coarse calcifications in the prostate. Other: There is interval appearance of small ascites. There is no pneumoperitoneum. Umbilical hernia containing fat is seen. Small right inguinal hernia containing fat is seen. Musculoskeletal: There is 50% decrease in height of body of T12 vertebra with no significant change. IMPRESSION: There is significant interval worsening of colonic distention without dilation of distal sigmoid. There is possible wall thickening in the sigmoid colon at the level of transition. Findings suggest possible obstruction in the sigmoid colon due to stricture or malignant neoplasm. There are slightly enlarged lymph nodes adjacent to sigmoid colon measuring up to 1.2 cm which may suggest reactive hyperplasia or neoplastic process. Endoscopic correlation should be considered. There is interval worsening of distal small bowel dilation. There is interval appearance of small ascites. There is no pneumoperitoneum. Linear densities in the right lower lung fields suggest subsegmental atelectasis. Other findings as described in the body of the report. Electronically Signed   By: Ernie AvenaPalani  Rathinasamy M.D.   On: 04/16/2022 15:15    DG Abd Portable 1V  Result Date: 04/17/2022 CLINICAL DATA:  NG tube placement EXAM: PORTABLE ABDOMEN - 1 VIEW COMPARISON:  Study done earlier today FINDINGS: There is interval repositioning of endotracheal tube with its tip in the region of fundus of the stomach. There is dilation of small-bowel loops measuring up to 4.8 cm. Gas and stool are noted in colon. A percutaneous catheter is noted in the left side of the abdomen which is not fully evaluated. IMPRESSION: Tip of enteric tube is seen in the stomach. There is dilation of small-bowel loops suggesting ileus or partial obstruction. Electronically Signed   By: Ernie AvenaPalani  Rathinasamy M.D.   On: 04/17/2022 16:46    Anti-infectives: Anti-infectives (From admission, onward)    Start     Dose/Rate Route Frequency Ordered Stop   04/17/22 0845  cefoTEtan (CEFOTAN) 2 g in sodium chloride 0.9 % 100 mL IVPB        2 g 200 mL/hr over 30 Minutes Intravenous On call to O.R. 04/17/22 0751 04/17/22 0924   04/14/22 1800  piperacillin-tazobactam (ZOSYN) IVPB 3.375 g       Note to Pharmacy: Pharmacy may adjust dosing strength, schedule, rate of infusion, etc as needed to optimize therapy   3.375 g 12.5 mL/hr over 240 Minutes Intravenous Every 8 hours  04/14/22 1037     04/13/22 0900  piperacillin-tazobactam (ZOSYN) IVPB 3.375 g  Status:  Discontinued        3.375 g 12.5 mL/hr over 240 Minutes Intravenous Every 8 hours 04/13/22 0814 04/14/22 1037   04/11/22 2145  cefTRIAXone (ROCEPHIN) 2 g in sodium chloride 0.9 % 100 mL IVPB  Status:  Discontinued        2 g 200 mL/hr over 30 Minutes Intravenous Every 24 hours 04/11/22 2143 04/13/22 0814   04/11/22 2145  metroNIDAZOLE (FLAGYL) IVPB 500 mg  Status:  Discontinued        500 mg 100 mL/hr over 60 Minutes Intravenous Every 12 hours 04/11/22 2143 04/13/22 0814        Assessment/Plan  POD 1, s/p robotic Hartmann's procedure for Sigmoid diverticulitis with colonic obstruction from stricture, Dr. Michaell Cowing 5/23 -  afebrile, VSS, WBC 113.2 - cont JP drain -WOC consult for new colostomy.  Leave Malecot drain for a couple of days to help decompress colon where he was obstructed. -leave NGT for now secondary to likely ileus -multi-modal pain control -mobilize, pulm toilet -foley out and voiding well -we will take patient on our service if primary ok with that  FEN - NPO/NGT/IVFs VTE - lovenox ID - zosyn  HTN - prn meds ordered and has norvasc and losartan at home   LOS: 7 days   Letha Cape, [facility] Surgery Please see Amion for pager number during day hours 7:00am-4:30pm

## 2022-04-18 NOTE — TOC Initial Note (Signed)
Transition of Care [facility]) - Initial/Assessment Note   Patient Details  Name: Paul Gilmore MRN: [id number] Date of Birth: [birth year]  Transition of Care [facility]) CM/SW Contact:    Sherie Don, LCSW Phone Number: 04/18/2022, 3:40 PM  Clinical Narrative: Patient will need HHRN as he is a new ostomy. Patient referred to the following [facility] agencies:  [facility]: declined [facility]: out-of-network [facility]: declined [facility]: not accepting [facility] referrals at this time [facility]: declined [facility]: declined [facility]: declined [facility]: declined [facility]: declined  No HH agencies accepted the referral at this time. TOC to follow.  Expected Discharge Plan: [place] Barriers to Discharge: No [facility] will accept this patient  Patient Goals and CMS Choice CMS Medicare.gov Compare Post Acute Care list provided to:: Patient Choice offered to / list presented to : Patient  Expected Discharge Plan and Services Expected Discharge Plan: [place] In-house Referral: Clinical Social Work Post Acute Care Choice: [place] arrangements for the past 2 months: [place]              DME Arranged: N/A DME Agency: NA  Prior Living Arrangements/Services Living arrangements for the past 2 months: [place] Lives with:: Spouse Patient language and need for interpreter reviewed:: Yes Do you feel safe going back to the place where you live?: Yes      Need for Family Participation in Patient Care: No (Comment) Care giver support system in place?: Yes (comment) Criminal Activity/Legal Involvement Pertinent to Current Situation/Hospitalization: No - Comment as needed  Activities of Daily Living Home Assistive Devices/Equipment: None ADL Screening (condition at time of admission) Patient's cognitive ability adequate to safely complete daily activities?: Yes Is the patient deaf or have difficulty hearing?: No Does the patient have difficulty  seeing, even when wearing glasses/contacts?: No Does the patient have difficulty concentrating, remembering, or making decisions?: No Patient able to express need for assistance with ADLs?: Yes Does the patient have difficulty dressing or bathing?: No Independently performs ADLs?: Yes (appropriate for developmental age) Does the patient have difficulty walking or climbing stairs?: No Weakness of Legs: None Weakness of Arms/Hands: None  Emotional Assessment Appearance:: Appears stated age Orientation: : Oriented to Self, Oriented to Place, Oriented to  Time, Oriented to Situation Alcohol / Substance Use: Not Applicable Psych Involvement: No (comment)  Admission diagnosis:  Colitis [K52.9] Abdominal pain, LLQ [R10.32] Patient Active Problem List   Diagnosis Date Noted   Sebaceous cyst 04/14/2022   Partial obstruction of colon ([place]) 04/14/2022   Hypertensive urgency 04/12/2022   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 04/11/2022   Chronic idiopathic gout involving toe of left foot without tophus 01/26/2020   Essential hypertension 10/27/2018   Diverticulitis 10/09/2018   Thoracic back pain 04/01/2018   Diverticulitis of intestine with abscess 03/22/2018   Obesity, Class III, BMI 40-49.9 (morbid obesity) ([place]) 03/08/2018   Perforation of sigmoid colon due to diverticulitis 03/03/2018   Hyperglycemia 03/03/2018   Right foot pain 06/14/2017   Great toe pain, left 03/04/2017   Lower back injury 09/24/2016   Hand laceration 09/24/2016   Neck pain 02/08/2014   PCP:  [name], MD Pharmacy:   [facility] [phone] - [place], [place] - [address] [place] [address] Phone: [phone] Fax: [phone]  Readmission Risk Interventions     View : No data to  display.

## 2022-04-19 LAB — SURGICAL PATHOLOGY

## 2022-04-19 LAB — CREATININE, SERUM
Creatinine, Ser: 0.87 mg/dL (ref 0.61–1.24)
GFR, Estimated: >60 mL/min (ref >60)

## 2022-04-19 MED ORDER — METHOCARBAMOL 500 MG PO TABS
1000.0000 mg | ORAL_TABLET | Freq: Three times a day (TID) | ORAL | Status: DC
  Administered 2022-04-19 – 2022-04-22 (×10): 1000 mg via ORAL
  Filled 2022-04-19 (×11): qty 2

## 2022-04-19 MED ORDER — ACETAMINOPHEN 500 MG PO TABS
1000.0000 mg | ORAL_TABLET | Freq: Four times a day (QID) | ORAL | Status: DC
  Administered 2022-04-19 – 2022-04-21 (×10): 1000 mg via ORAL
  Administered 2022-04-21: 500 mg via ORAL
  Administered 2022-04-22: 1000 mg via ORAL
  Filled 2022-04-19 (×12): qty 2

## 2022-04-19 MED ORDER — OXYCODONE HCL 5 MG PO TABS
5.0000 mg | ORAL_TABLET | ORAL | Status: DC | PRN
  Administered 2022-04-19 – 2022-04-20 (×9): 10 mg via ORAL
  Administered 2022-04-20: 5 mg via ORAL
  Administered 2022-04-21 – 2022-04-22 (×6): 10 mg via ORAL
  Filled 2022-04-19 (×15): qty 2

## 2022-04-19 NOTE — Consult Note (Signed)
WOC Nurse ostomy consult note Wife at bedside and observes pouch change.   Stoma type/location: LLQ colostomy, dusky, productive.  Surgery aware. Patient informed this would likely slough off. Stomal assessment/size: 2" , flush, os at 10:00, points downward Peristomal assessment:  rounded, firm, edematous Treatment options for stomal/peristomal skin:  barrier ring and switch to 1 piece convex. Output soft brown stool Ostomy pouching: 1pc. Convex with barrier ring. Will order belt to add as well.  Education provided: POuch change performed explained rationale for 1 piece convex (flush stoma) .  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  Discussed the outpatient ostomy clinic and that he could be seen post discharge.  We discussed secure start and supplies after discharge (likely [facility]) .  They are able to roll closed.  Demonstrated emptying.   Enrolled patient in [employer] DC program: Yes today.  Will follow.  [place]le [place] MSN, RN, FNP-BC CWON Wound, Ostomy, Continence Nurse Pager [phone]

## 2022-04-19 NOTE — TOC Progression Note (Signed)
Transition of Care [facility] - Progression Note   Patient Details  Name: Paul Gilmore MRN: [id number] Date of Birth: 09/27/1976  Transition of Care [facility]) CM/SW Contact  Ewing Schlein, LCSW Phone Number: 04/19/2022, 2:12 PM  Clinical Narrative: CSW made [facility] referral to [facility] with [facility], which was accepted. HH orders placed. CSW spoke with patient's wife regarding [facility] for the new ostomy. Wife asked if [facility] was needed and CSW explained that [facility] is set up for new ostomies and recommended that patient/wife discuss any specific questions with the surgery team about how nursing will assist after discharge.  Expected Discharge Plan: Home w Home Health Services Barriers to Discharge: Continued Medical Work up  Expected Discharge Plan and Services Expected Discharge Plan: Home w Home Health Services In-house Referral: Clinical Social Work Post Acute Care Choice: Home Health Living arrangements for the past 2 months: Single Family Home           DME Arranged: N/A DME Agency: NA HH Arranged: RN HH Agency: [facility] Date HH Agency Contacted: 04/19/22 Time HH Agency Contacted: 1101 Representative spoke with at [facility] Agency: [place]  Readmission Risk Interventions     View : No data to display.

## 2022-04-19 NOTE — Progress Notes (Signed)
[facility] Progress Note  2 Days Post-Op  Subjective: Feels much better today.  Pain control issue at times just bc dilaudid wears off fast.  Ambulated yesterday.    Objective: Vital signs in last 24 hours: Temp:  [97.7 F (36.5 C)-98.2 F (36.8 C)] 97.7 F (36.5 C) (05/25 0637) Pulse Rate:  [87-100] 87 (05/25 0637) Resp:  [18] 18 (05/25 0637) BP: (139-152)/(85-96) 152/90 (05/25 0637) SpO2:  [96 %-97 %] 97 % (05/25 0637) Last BM Date : 04/18/22  Intake/Output from previous day: 05/24 0701 - 05/25 0700 In: 1833.1 [P.O.:120; I.V.:1063.3; IV Piggyback:649.8] Out: [id number] [Urine:2350; Emesis/NG output:1350; Drains:245; [id number] Intake/Output this shift: No intake/output data recorded.  PE: Abd: Soft, appropriately tender, incisions are clean and covered, JP with serosang output.  Colostomy with feculent output.  No malecot noted today.  Digital exam and unable to feel.  Stoma is dark and likely ischemic, but just beyond the exterior layer it is pink and viable.  Great BS.  NGT with some bilious output, but minimal overnight    Lab Results:  Recent Labs    04/17/22 0406 04/18/22 0422  WBC 11.2* 13.2*  HGB 16.8 14.9  HCT 47.6 42.6  PLT 335 334   BMET Recent Labs    04/17/22 0406 04/18/22 0422 04/19/22 0427  NA 134* 134*  --   K 4.2 4.4  --   CL 98 99  --   CO2 25 23  --   GLUCOSE 103* 128*  --   BUN 11 18  --   CREATININE 0.93 1.02 0.87  CALCIUM 9.0 8.4*  --    PT/INR No results for input(s): LABPROT, INR in the last 72 hours. CMP     Component Value Date/Time   NA 134 (L) 04/18/2022 0422   K 4.4 04/18/2022 0422   CL 99 04/18/2022 0422   CO2 23 04/18/2022 0422   GLUCOSE 128 (H) 04/18/2022 0422   BUN 18 04/18/2022 0422   CREATININE 0.87 04/19/2022 0427   CALCIUM 8.4 (L) 04/18/2022 0422   PROT 5.8 (L) 04/18/2022 0422   ALBUMIN 2.9 (L) 04/18/2022 0422   AST 35 04/18/2022 0422   ALT 31 04/18/2022 0422   ALKPHOS 50 04/18/2022 0422   BILITOT  1.1 04/18/2022 0422   GFRNONAA >60 04/19/2022 0427   GFRAA >60 10/30/2018 1059   Lipase     Component Value Date/Time   LIPASE 23 04/11/2022 1547       Studies/Results: DG Abd 1 View  Result Date: 04/17/2022 CLINICAL DATA:  NG tube placement EXAM: ABDOMEN - 1 VIEW COMPARISON:  04/13/2022 FINDINGS: Tip of enteric tube is seen in the course of thoracic esophagus slightly below the level of the carina. There is gaseous dilation of bowel loops. Abdomen is not included in its entirety limiting evaluation. IMPRESSION: Tip of enteric tube is seen in the thoracic esophagus. It should be advanced approximately 15 cm to place the tip and side port within the stomach. Electronically Signed   By: Elmer Picker M.D.   On: 04/17/2022 15:50   DG Abd Portable 1V  Result Date: 04/17/2022 CLINICAL DATA:  NG tube placement EXAM: PORTABLE ABDOMEN - 1 VIEW COMPARISON:  Study done earlier today FINDINGS: There is interval repositioning of endotracheal tube with its tip in the region of fundus of the stomach. There is dilation of small-bowel loops measuring up to 4.8 cm. Gas and stool are noted in colon. A percutaneous catheter is noted in the left side of  the abdomen which is not fully evaluated. IMPRESSION: Tip of enteric tube is seen in the stomach. There is dilation of small-bowel loops suggesting ileus or partial obstruction. Electronically Signed   By: Elmer Picker M.D.   On: 04/17/2022 16:46    Anti-infectives: Anti-infectives (From admission, onward)    Start     Dose/Rate Route Frequency Ordered Stop   04/17/22 0845  cefoTEtan (CEFOTAN) 2 g in sodium chloride 0.9 % 100 mL IVPB        2 g 200 mL/hr over 30 Minutes Intravenous On call to O.R. 04/17/22 [id number] 04/17/22 0924   04/14/22 1800  piperacillin-tazobactam (ZOSYN) IVPB 3.375 g       Note to Pharmacy: Pharmacy may adjust dosing strength, schedule, rate of infusion, etc as needed to optimize therapy   3.375 g 12.5 mL/hr over 240  Minutes Intravenous Every 8 hours 04/14/22 1037     04/13/22 0900  piperacillin-tazobactam (ZOSYN) IVPB 3.375 g  Status:  Discontinued        3.375 g 12.5 mL/hr over 240 Minutes Intravenous Every 8 hours 04/13/22 0814 04/14/22 1037   04/11/22 2145  cefTRIAXone (ROCEPHIN) 2 g in sodium chloride 0.9 % 100 mL IVPB  Status:  Discontinued        2 g 200 mL/hr over 30 Minutes Intravenous Every 24 hours 04/11/22 2143 04/13/22 0814   04/11/22 2145  metroNIDAZOLE (FLAGYL) IVPB 500 mg  Status:  Discontinued        500 mg 100 mL/hr over 60 Minutes Intravenous Every 12 hours 04/11/22 2143 04/13/22 0814        Assessment/Plan  POD 2, s/p robotic Hartmann's procedure for Sigmoid diverticulitis with colonic obstruction from stricture, Dr. Johney Maine 5/23 - afebrile, VSS, WBC 13.2 yesterday - cont JP drain -WOC consult for new colostomy.   -malecot drain not visible and not palpable on digit exam.  Will d/w MD -clamp NGT, has great BS and ostomy still working well, but slowed some as expected -multi-modal pain control, add orals -mobilize, pulm toilet -foley out and voiding well   FEN - NPOx ice and sips of clears/clamp NGT/IVFs VTE - lovenox ID - zosyn  HTN - prn meds ordered and has norvasc and losartan at home   LOS: 8 days   Henreitta Cea, [facility] Surgery Please see Amion for pager number during day hours 7:00am-4:30pm

## 2022-04-20 ENCOUNTER — Inpatient Hospital Stay (HOSPITAL_COMMUNITY): Payer: Commercial Managed Care - PPO

## 2022-04-20 LAB — MAGNESIUM: Magnesium: 2.1 mg/dL (ref 1.7–2.4)

## 2022-04-20 LAB — BASIC METABOLIC PANEL
Anion gap: 9 (ref 5–15)
BUN: 10 mg/dL (ref 6–20)
CO2: 29 mmol/L (ref 22–32)
Calcium: 8.7 mg/dL — ABNORMAL LOW (ref 8.9–10.3)
Chloride: 97 mmol/L — ABNORMAL LOW (ref 98–111)
Creatinine, Ser: 0.82 mg/dL (ref 0.61–1.24)
GFR, Estimated: >60 mL/min (ref >60)
Glucose, Bld: 124 mg/dL — ABNORMAL HIGH (ref 70–99)
Potassium: 3.8 mmol/L (ref 3.5–5.1)
Sodium: 135 mmol/L (ref 135–145)

## 2022-04-20 LAB — CBC
HCT: 34.7 % — ABNORMAL LOW (ref 39.0–52.0)
Hemoglobin: 11.8 g/dL — ABNORMAL LOW (ref 13.0–17.0)
MCH: 31.7 pg (ref 26.0–34.0)
MCHC: 34 g/dL (ref 30.0–36.0)
MCV: 93.3 fL (ref 80.0–100.0)
Platelets: 396 10*3/uL (ref 150–400)
RBC: 3.72 MIL/uL — ABNORMAL LOW (ref 4.22–5.81)
RDW: 12.4 % (ref 11.5–15.5)
WBC: 13.4 10*3/uL — ABNORMAL HIGH (ref 4.0–10.5)
nRBC: 0 % (ref 0.0–0.2)

## 2022-04-20 LAB — PHOSPHORUS: Phosphorus: 1.9 mg/dL — ABNORMAL LOW (ref 2.5–4.6)

## 2022-04-20 LAB — PREALBUMIN: Prealbumin: 6.4 mg/dL — ABNORMAL LOW (ref 18–38)

## 2022-04-20 MED ORDER — GABAPENTIN 300 MG PO CAPS
300.0000 mg | ORAL_CAPSULE | Freq: Every day | ORAL | Status: DC
  Administered 2022-04-20: 300 mg via ORAL
  Filled 2022-04-20 (×2): qty 1

## 2022-04-20 NOTE — Progress Notes (Signed)
Patient recovering status post urgent robotic Hartmann resection for severe rectosigmoid stricture. Path benign & consistent with diverticulitis.  No cancer or tumor. I told the pt and his wife at the bedside the good news

## 2022-04-20 NOTE — Progress Notes (Signed)
Paul Gilmore [id number] Dec 17, 1975  CARE TEAM:  PCP: Myra Rude, MD  Outpatient Care Team: Patient Care Team: Myra Rude, MD as PCP - General (Family Medicine) Armbruster, Willaim Rayas, MD as Consulting Physician (Gastroenterology) Karie Soda, MD as Consulting Physician (General Surgery)  Inpatient Treatment Team: Treatment Team: Attending Provider: Montez Morita, Md, MD; Consulting Physician: Montez Morita, Md, MD; WOC Nurse: Leanna Battles, RN; WOC Nurse: Dorthula Nettles, RN; Pharmacist: Cindi Carbon, [facility]; Utilization Review: [name], RN; Charge Nurse: Saddie Benders, RN   Problem List:   Principal Problem:   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 Active Problems:   Obesity, Class III, BMI 40-49.9 (morbid obesity) (HCC)   Essential hypertension   Chronic idiopathic gout involving toe of left foot without tophus   Hypertensive urgency   Partial obstruction of colon (HCC)   3 Days Post-Op  04/17/2022  POST-OPERATIVE DIAGNOSIS:  RECURRENT DIVERTICULITIS WITH COLON OBSTRUCTION    PROCEDURE:   ROBOTIC low anterior RECTOSIGMOID RESECTION WITH END COLOSTOMY ROBOTIC LYSIS OF ADHESIONS X 2 HOURS BLADDER REPAIR TRANSVERSUS ABDOMINIS PLANE (TAP) BLOCK - BILATERAL   SURGEON:  Ardeth Sportsman, MD  OR FINDINGS:   Patient had very dilated colon and even small intestine with transition point at the rectosigmoid junction.  Patient had extremely dense concrete adhesions of twisted rectosigmoid colon to the midline retroperitoneum as well as the dome of the bladder and right and left lateral pelvis   Prolene sutures placed on the rectal stump which is at the proximal/mid rectal junction No obvious metastatic disease on visceral parietal peritoneum or liver.           Assessment  Ileus resolving  [facility] Stay = 9 days)  Plan: -Pathology consistent with diverticulitis.  Reassuring.  Patient and spouse updated - afebrile,  VSS, -Tolerating clamping trial.  Removed nasogastric tube.  Started on dysphagia 1/full liquid diet.  If tolerates can advance to soft diet tomorrow. -Malecot drain looks like it is stuck in the swelling flexure of the colon.  Cannot feel to remove it.  Will retry next week with some MiraLAX to see if he can naturally push it out and remove it once closer to the edge.  Final option flexible sigmoidoscopy.  Hold off for now since abdominal wall colostomy somewhat ischemic and thickened and edematous. -Okay to remove surgical drain. -WOC consult for new colostomy.   -multi-modal pain control, increase oral medication.  Scheduled Tylenol.  Scheduled methocarbamol.  Gabapentin nightly.  Try and transition to oral medication.   he likes his IV Dilaudid.  Trying to start weaning off. -mobilize, pulm toilet -foley out and voiding well     FEN -his cath removed.  Advance to full liquid/dysphagia 1 diet. VTE - lovenox ID - zosyn x5 days postop.  Should finish 5/28 evening   HTN - prn meds ordered and has norvasc and losartan at home    LOS: 8 days  -VTE prophylaxis- SCDs, etc -mobilize as tolerated to help recovery  Disposition:  Disposition:  The patient is from: Home  Anticipate discharge to:  Home with Home Health  Anticipated Date of Discharge is:  June 1,2023    Barriers to discharge:  Pending Clinical improvement (more likely than not)  Patient currently is NOT MEDICALLY STABLE for discharge from the hospital from a surgery standpoint.      I reviewed nursing notes, last 24 h vitals and pain scores, last 48 h intake and output,  last 24 h labs and trends, and last 24 h imaging results. I have reviewed this patient's available data, including medical history, events of note, test results, etc as part of my evaluation.  A significant portion of that time was spent in counseling.  Care during the described time interval was provided by me.  This care required moderate level of  medical decision making.  04/20/2022    Subjective: (Chief complaint)  Patient tolerating clamping.  Moving his bowels better.  Still with soreness with activity.  Asked for Dilaudid.  Wife in room.  He remains very appreciative  Objective:  Vital signs:  Vitals:   04/19/22 0637 04/19/22 1412 04/19/22 2108 04/20/22 0607  BP: (!) 152/90 (!) 157/85 117/67 (!) 136/93  Pulse: 87 98 99 100  Resp: Temp: 97.7 F (36.5 C) 98 F (36.7 C) 98.8 F (37.1 C) 98.3 F (36.8 C)  TempSrc: Oral Oral Oral Oral  SpO2: 97% 99% 100% 97%  Weight:      Height:        Last BM Date : 04/19/22  Intake/Output   Yesterday:  05/25 0701 - 05/26 0700 In: 1855.5 [P.O.:600; I.V.:1154.2; IV Piggyback:101.3] Out: 2065 [Urine:1175; Emesis/NG output:450; Drains:90; Stool:350] This shift:  No intake/output data recorded.  Bowel function:  Flatus: YES  BM:  YES  Drain: Serosanguinous   Physical Exam:  General: Pt awake/alert in no acute distress Eyes: PERRL, normal EOM.  Sclera clear.  No icterus Neuro: CN II-XII intact w/o focal sensory/motor deficits. Lymph: No head/neck/groin lymphadenopathy Psych:  No delerium/psychosis/paranoia.  Oriented x 4 HENT: Normocephalic, Mucus membranes moist.  No thrush Neck: Supple, No tracheal deviation.  No obvious thyromegaly Chest: No pain to chest wall compression.  Good respiratory excursion.  No audible wheezing CV:  Pulses intact.  Regular rhythm.  No major extremity edema MS: Normal AROM mjr joints.  No obvious deformity  Abdomen: Morbidly obese but Soft.  Mildy distended.  Tenderness at colostomy site only.  Colostomy with some purple ischemia but viable.  I finger intubated and again cannot feel a mental account tube.  Held off on more aggressive intubation.  Ostomy with good seal. .  No evidence of peritonitis.  No incarcerated hernias.  Ext:   No deformity.  No mjr edema.  No cyanosis Skin: No petechiae / purpurea.  No major  sores.  Warm and dry    Results:   Cultures: Recent Results (from the past 720 hour(s))  Resp Panel by RT-PCR (Flu A&B, Covid) Nasopharyngeal Swab     Status: None   Collection Time: 04/11/22  9:31 PM   Specimen: Nasopharyngeal Swab; Nasopharyngeal(NP) swabs in vial transport medium  Result Value Ref Range Status   SARS Coronavirus 2 by RT PCR NEGATIVE NEGATIVE Final    Comment: (NOTE) SARS-CoV-2 target nucleic acids are NOT DETECTED.  The SARS-CoV-2 RNA is generally detectable in upper respiratory specimens during the acute phase of infection. The lowest concentration of SARS-CoV-2 viral copies this assay can detect is 138 copies/mL. A negative result does not preclude SARS-Cov-2 infection and should not be used as the sole basis for treatment or other patient management decisions. A negative result may occur with  improper specimen collection/handling, submission of specimen other than nasopharyngeal swab, presence of viral mutation(s) within the areas targeted by this assay, and inadequate number of viral copies(<138 copies/mL). A negative result must be combined with clinical observations, patient history, and epidemiological information. The expected result is Negative.  Fact Sheet for Patients:  BloggerCourse.comhttps://www.fda.gov/media/152166/download  Fact Sheet for Healthcare Providers:  SeriousBroker.ithttps://www.fda.gov/media/152162/download  This test is no t yet approved or cleared by the [place] FDA and  has been authorized for detection and/or diagnosis of SARS-CoV-2 by FDA under an Emergency Use Authorization (EUA). This EUA will remain  in effect (meaning this test can be used) for the duration of the COVID-19 declaration under Section 564(b)(1) of the Act, 21 U.S.C.section 360bbb-3(b)(1), unless the authorization is terminated  or revoked sooner.       Influenza A by PCR NEGATIVE NEGATIVE Final   Influenza B by PCR NEGATIVE NEGATIVE Final    Comment: (NOTE) The Xpert Xpress  SARS-CoV-2/FLU/RSV plus assay is intended as an aid in the diagnosis of influenza from Nasopharyngeal swab specimens and should not be used as a sole basis for treatment. Nasal washings and aspirates are unacceptable for Xpert Xpress SARS-CoV-2/FLU/RSV testing.  Fact Sheet for Patients: BloggerCourse.comhttps://www.fda.gov/media/152166/download  Fact Sheet for Healthcare Providers: SeriousBroker.ithttps://www.fda.gov/media/152162/download  This test is not yet approved or cleared by the [place] FDA and has been authorized for detection and/or diagnosis of SARS-CoV-2 by FDA under an Emergency Use Authorization (EUA). This EUA will remain in effect (meaning this test can be used) for the duration of the COVID-19 declaration under Section 564(b)(1) of the Act, 21 U.S.C. section 360bbb-3(b)(1), unless the authorization is terminated or revoked.  Performed at [facility], [address] [place], [place] [address]     Labs: Results for orders placed or performed during the hospital encounter of 04/11/22 (from the past 48 hour(s))  Creatinine, serum     Status: None   Collection Time: 04/19/22  4:27 AM  Result Value Ref Range   Creatinine, Ser 0.87 0.61 - 1.24 mg/dL   GFR, Estimated >60>60 >45>60 mL/min    Comment: (NOTE) Calculated using the CKD-EPI Creatinine Equation (2021) Performed at [facility], [address] [place], [place] [address]     Imaging / Studies: No results found.  Medications / Allergies: per chart  Antibiotics: Anti-infectives (From admission, onward)    Start     Dose/Rate Route Frequency Ordered Stop   04/17/22 0845  cefoTEtan (CEFOTAN) 2 g in sodium chloride 0.9 % 100 mL IVPB        2 g 200 mL/hr over 30 Minutes Intravenous On call to O.R. 04/17/22 0751 04/17/22 0924   04/14/22 1800  piperacillin-tazobactam (ZOSYN) IVPB 3.375 g       Note to Pharmacy: Pharmacy may adjust dosing strength, schedule, rate of infusion, etc as needed to  optimize therapy   3.375 g 12.5 mL/hr over 240 Minutes Intravenous Every 8 hours 04/14/22 1037     04/13/22 0900  piperacillin-tazobactam (ZOSYN) IVPB 3.375 g  Status:  Discontinued        3.375 g 12.5 mL/hr over 240 Minutes Intravenous Every 8 hours 04/13/22 0814 04/14/22 1037   04/11/22 2145  cefTRIAXone (ROCEPHIN) 2 g in sodium chloride 0.9 % 100 mL IVPB  Status:  Discontinued        2 g 200 mL/hr over 30 Minutes Intravenous Every 24 hours 04/11/22 2143 04/13/22 0814   04/11/22 2145  metroNIDAZOLE (FLAGYL) IVPB 500 mg  Status:  Discontinued        500 mg 100 mL/hr over 60 Minutes Intravenous Every 12 hours 04/11/22 2143 04/13/22 [id number]         Note: Portions of this report may have been transcribed using voice recognition software. Every  effort was made to ensure accuracy; however, inadvertent computerized transcription errors may be present.   Any transcriptional errors that result from this process are unintentional.    Ardeth Sportsman, MD, FACS, MASCRS Esophageal, Gastrointestinal & Colorectal Surgery Robotic and Minimally Invasive Surgery  [facility], [facility]  [facility]  [address] [place], [place] [address] [phone] Fax [phone] Main  CONTACT INFORMATION:  Weekday (9AM-5PM): Call [facility] main office at [phone]  Weeknight (5PM-9AM) or Weekend/Holiday: Check [email or web address] (password " [email or web address]") for General Surgery [facility] coverage  (Please, do not use SecureChat as it is not reliable communication to operating surgeons for immediate patient care)      04/20/2022  8:40 AM

## 2022-04-21 LAB — CREATININE, SERUM
Creatinine, Ser: 0.76 mg/dL (ref 0.61–1.24)
GFR, Estimated: >60 mL/min (ref >60)

## 2022-04-21 NOTE — Progress Notes (Signed)
4 Days Post-Op   Subjective/Chief Complaint: Tolerating liquid diet without difficulty Using some PO pain meds, but still requiring some IV Dilaudid Drain was removed, but one retained suture   Objective: Vital signs in last 24 hours: Temp:  [97.9 F (36.6 C)-98.4 F (36.9 C)] 98.4 F (36.9 C) (05/27 0626) Pulse Rate:  [72-87] 72 (05/27 0614) Resp:  [18] 18 (05/27 0614) BP: (134-148)/(79-84) 134/84 (05/27 0614) SpO2:  [94 %-99 %] 94 % (05/27 0614) Last BM Date : 04/19/22  Intake/Output from previous day: 05/26 0701 - 05/27 0700 In: 724.7 [P.O.:600; IV Piggyback:124.7] Out: 2795 [Urine:2075; Drains:20; Stool:700] Intake/Output this shift: No intake/output data recorded.  Obese Abd - soft, incisional tenderness Mild ecchymosis around incisions Drain suture removed Ostomy viable - dark stool output  Lab Results:  Recent Labs    04/20/22 0844  WBC 13.4*  HGB 11.8*  HCT 34.7*  PLT 396   BMET Recent Labs    04/20/22 0844 04/21/22 0405  NA 135  --   K 3.8  --   CL 97*  --   CO2 29  --   GLUCOSE 124*  --   BUN 10  --   CREATININE 0.82 0.76  CALCIUM 8.7*  --    PT/INR No results for input(s): LABPROT, INR in the last 72 hours. ABG No results for input(s): PHART, HCO3 in the last 72 hours.  Invalid input(s): PCO2, PO2  Studies/Results: DG Abd Portable 1V  Result Date: 04/20/2022 CLINICAL DATA:  Question Malecot tube in colon near colostomy. EXAM: PORTABLE ABDOMEN - 1 VIEW COMPARISON:  Abdominal radiograph 04/17/2022 FINDINGS: An enteric tube terminates in the region of the distal stomach. Moderate diffuse gaseous small bowel distension is similar to the prior study. A drain projects over the pelvis. An additional tube projecting over the left lower quadrant on the prior study is no longer seen. IMPRESSION: 1. Enteric tube and pelvic drain in place. Additional tube projecting over the left lower quadrant on the prior study is no longer seen. 2. Unchanged diffuse  gaseous small bowel distension, potentially ileus. Electronically Signed   By: Sebastian Ache M.D.   On: 04/20/2022 08:44    Anti-infectives: Anti-infectives (From admission, onward)    Start     Dose/Rate Route Frequency Ordered Stop   04/17/22 0845  cefoTEtan (CEFOTAN) 2 g in sodium chloride 0.9 % 100 mL IVPB        2 g 200 mL/hr over 30 Minutes Intravenous On call to O.R. 04/17/22 0751 04/17/22 0924   04/14/22 1800  piperacillin-tazobactam (ZOSYN) IVPB 3.375 g       Note to Pharmacy: Pharmacy may adjust dosing strength, schedule, rate of infusion, etc as needed to optimize therapy   3.375 g 12.5 mL/hr over 240 Minutes Intravenous Every 8 hours 04/14/22 1037 04/22/22 1800   04/13/22 0900  piperacillin-tazobactam (ZOSYN) IVPB 3.375 g  Status:  Discontinued        3.375 g 12.5 mL/hr over 240 Minutes Intravenous Every 8 hours 04/13/22 0814 04/14/22 1037   04/11/22 2145  cefTRIAXone (ROCEPHIN) 2 g in sodium chloride 0.9 % 100 mL IVPB  Status:  Discontinued        2 g 200 mL/hr over 30 Minutes Intravenous Every 24 hours 04/11/22 2143 04/13/22 0814   04/11/22 2145  metroNIDAZOLE (FLAGYL) IVPB 500 mg  Status:  Discontinued        500 mg 100 mL/hr over 60 Minutes Intravenous Every 12 hours 04/11/22 2143 04/13/22 9485  Assessment/Plan: s/p Procedure(s): ROBOTIC LOW ANTERIOR  RECTOSIGMOID RESECTION WITH END COLOSTOMY, ROBOTIC LYSIS OF ADHESIONS, BLADDER REPAIR, BILATERAL TAP BLOCK TRANSVERSUS ABDOMINIS PLANE (TAP) BLOCK - BILATERAL (N/A) Diverticulitis with stricture/ obstruction  Soft diet PO pain meds Encourage ambulation Probably ready for discharge in next 1-2 days, if HHRN set up  LOS: 10 days    Wynona Luna 04/21/2022

## 2022-04-22 MED ORDER — OXYCODONE HCL 5 MG PO TABS: 5.0000 mg | ORAL_TABLET | ORAL | 0 refills | Status: DC | PRN

## 2022-04-22 MED ORDER — METHOCARBAMOL 1000 MG PO TABS: 1000.0000 mg | ORAL_TABLET | Freq: Three times a day (TID) | ORAL | 0 refills | Status: DC

## 2022-04-22 NOTE — Discharge Summary (Signed)
Physician Discharge Summary  Patient ID: Paul Gilmore MRN: [id number] DOB/AGE: August 08, 1976 46 y.o.  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  Conservative measures were unsuccessful, so on 04/17/22 he underwent robotic low anterior resection with end colostomy.  His post-op course was slowed by his pain control issues, but he has improved to using just PO pain meds and muscle relaxant.  Pathology was benign.  He has advanced to a regular diet and is only using oral pain control.    A Malecot drain had been placed in his colostomy as a stent to allow decompression of the distended colon.  The Malecot appears to have moved up in the proximal descending colon.  No sign of obstruction.  Dr. Michaell Cowing will remove at a later date.     Treatments: surgery: as above  Discharge Exam: Blood pressure (!) 147/90, pulse 90, temperature 98 F (36.7 C), temperature source Oral, resp. rate 18, height 5\' 11"  (1.803 m), weight 119.3 kg, SpO2 98 %.   Obese Abd - soft, incisional tenderness Mild ecchymosis around incisions Drain suture removed Ostomy viable - dark stool output  Disposition: Discharge disposition: 01-Home or Self Care     Home Health RN has been set up for ostomy care.  Discharge Instructions     Call MD for:  persistant nausea and vomiting   Complete by: As directed    Call MD for:  redness, tenderness, or signs of  infection (pain, swelling, redness, odor or green/yellow discharge around incision site)   Complete by: As directed    Call MD for:  severe uncontrolled pain   Complete by: As directed    Call MD for:  temperature >100.4   Complete by: As directed    Diet general   Complete by: As directed    Driving Restrictions   Complete by: As directed    Do not drive while taking pain medications   Increase activity slowly   Complete by: As directed    May shower / Bathe   Complete by: As directed    Try to keep ostomy bag from becoming too wet in the shower, or you will need to change it.  The incisions are fine for the shower      Allergies as of 04/22/2022   No Known Allergies      Medication List     STOP taking these medications    amLODipine 10 MG tablet Commonly known as: Norvasc   hydrochlorothiazide 12.5 MG capsule Commonly known as: MICROZIDE   losartan 50 MG tablet Commonly known as: COZAAR       TAKE these medications    acetaminophen 500 MG tablet Commonly known as: TYLENOL Take 1,000 mg by mouth every 8 (eight) hours as needed for moderate pain.   albuterol 108 (90 Base) MCG/ACT inhaler Commonly known as: VENTOLIN HFA Inhale 1 puff into the lungs every 6 (six) hours as needed for wheezing or shortness of breath.   Methocarbamol 1000 MG Tabs Take 1,000 mg by  mouth 3 (three) times daily.   oxyCODONE 5 MG immediate release tablet Commonly known as: Oxy IR/ROXICODONE Take 1 tablet (5 mg total) by mouth every 4 (four) hours as needed for moderate pain.        Follow-up Information     Karie Soda, MD. Schedule an appointment as soon as possible for a visit in 3 week(s).   Specialties: General Surgery, Colon and Rectal Surgery Why: To follow up after your operation, To follow up after your hospital stay Contact information: [address] [place] [address] [phone]         [facility] Follow up.   Why: [facility] will  provide nursing services at home to assist with your new ostomy.                Signed: Wynona Luna 04/22/2022, 8:13 AM

## 2022-04-22 NOTE — Plan of Care (Signed)
  Problem: Pain Managment: Goal: General experience of comfort will improve Outcome: Progressing   Problem: Elimination: Goal: Will not experience complications related to bowel motility Outcome: Progressing Goal: Will not experience complications related to urinary retention Outcome: Progressing   

## 2022-04-24 NOTE — Progress Notes (Signed)
CSW received call from [place] with [facility] regarding HHRN orders. CSW provided [place] with the order entry date and [facility] was able to pull HH orders so start of care can be initiated. No face-to-face orders needed as patient is not a Medicare or Medicaid patient.

## 2022-04-26 HISTORY — PX: COLOSTOMY: SHX63

## 2022-05-05 ENCOUNTER — Other Ambulatory Visit: Payer: Self-pay

## 2022-05-05 ENCOUNTER — Emergency Department (HOSPITAL_COMMUNITY): Payer: Commercial Managed Care - PPO

## 2022-05-05 ENCOUNTER — Encounter (HOSPITAL_COMMUNITY): Payer: Self-pay

## 2022-05-05 ENCOUNTER — Emergency Department (HOSPITAL_COMMUNITY)
Admission: EM | Admit: 2022-05-05 | Discharge: 2022-05-06 | Disposition: A | Discharge status: Home / Self Care | Payer: Commercial Managed Care - PPO | Attending: Emergency Medicine | Admitting: Emergency Medicine

## 2022-05-05 DIAGNOSIS — D72829 Elevated white blood cell count, unspecified: Secondary | ICD-10-CM | POA: Diagnosis not present

## 2022-05-05 DIAGNOSIS — R319 Hematuria, unspecified: Secondary | ICD-10-CM | POA: Diagnosis present

## 2022-05-05 DIAGNOSIS — R310 Gross hematuria: Secondary | ICD-10-CM | POA: Diagnosis not present

## 2022-05-05 DIAGNOSIS — Z9889 Other specified postprocedural states: Secondary | ICD-10-CM | POA: Diagnosis not present

## 2022-05-05 LAB — CBC
HCT: 32.9 % — ABNORMAL LOW (ref 39.0–52.0)
Hemoglobin: 10.8 g/dL — ABNORMAL LOW (ref 13.0–17.0)
MCH: 30.3 pg (ref 26.0–34.0)
MCHC: 32.8 g/dL (ref 30.0–36.0)
MCV: 92.2 fL (ref 80.0–100.0)
Platelets: 551 10*3/uL — ABNORMAL HIGH (ref 150–400)
RBC: 3.57 MIL/uL — ABNORMAL LOW (ref 4.22–5.81)
RDW: 13.1 % (ref 11.5–15.5)
WBC: 9.9 10*3/uL (ref 4.0–10.5)
nRBC: 0 % (ref 0.0–0.2)

## 2022-05-05 LAB — PROTIME-INR
INR: 1.1 (ref 0.8–1.2)
Prothrombin Time: 13.6 seconds (ref 11.4–15.2)

## 2022-05-05 LAB — URINALYSIS, ROUTINE W REFLEX MICROSCOPIC
RBC / HPF: >50 RBC/hpf — ABNORMAL HIGH (ref 0–5)
WBC, UA: >50 WBC/hpf — ABNORMAL HIGH (ref 0–5)

## 2022-05-05 LAB — BASIC METABOLIC PANEL
Anion gap: 7 (ref 5–15)
BUN: 15 mg/dL (ref 6–20)
CO2: 26 mmol/L (ref 22–32)
Calcium: 9 mg/dL (ref 8.9–10.3)
Chloride: 107 mmol/L (ref 98–111)
Creatinine, Ser: 0.87 mg/dL (ref 0.61–1.24)
GFR, Estimated: >60 mL/min (ref >60)
Glucose, Bld: 122 mg/dL — ABNORMAL HIGH (ref 70–99)
Potassium: 4 mmol/L (ref 3.5–5.1)
Sodium: 140 mmol/L (ref 135–145)

## 2022-05-05 LAB — APTT: aPTT: 32 seconds (ref 24–36)

## 2022-05-05 LAB — TYPE AND SCREEN
ABO/RH(D): O NEG
Antibody Screen: NEGATIVE

## 2022-05-05 MED ORDER — HYDROMORPHONE HCL 1 MG/ML IJ SOLN
1.0000 mg | Freq: Once | INTRAMUSCULAR | Status: AC
  Administered 2022-05-05: 1 mg via INTRAVENOUS
  Filled 2022-05-05: qty 1

## 2022-05-05 MED ORDER — LACTATED RINGERS IV SOLN: INTRAVENOUS | Status: DC

## 2022-05-05 MED ORDER — SODIUM CHLORIDE 0.9 % IV SOLN
1.0000 g | Freq: Once | INTRAVENOUS | Status: AC
  Administered 2022-05-06: 1 g via INTRAVENOUS
  Filled 2022-05-05: qty 10

## 2022-05-05 MED ORDER — IOHEXOL 300 MG/ML  SOLN
100.0000 mL | Freq: Once | INTRAMUSCULAR | Status: AC | PRN
Start: 2022-05-05 — End: 2022-05-05
  Administered 2022-05-05: 100 mL via INTRAVENOUS

## 2022-05-05 NOTE — ED Provider Notes (Signed)
[facility]-EMERGENCY DEPT Provider Note   CSN: [id number] Arrival date & time: 05/05/22  2001     History  Chief Complaint  Patient presents with   Hematuria    Paul Gilmore is a 46 y.o. male.  HPI Patient is status post partial colectomy with colostomy done [phone] by Dr. Michaell CowingGross for complicated recurrent diverticulitis.  Patient reports he was doing fairly well at home.  He has been taking his pain medications.  For the past couple of days he has felt suprapubic pressure like he had a full bladder but he has been urinating.  He reports today he started seeing quite a bit of blood in the urine.  He denies any fevers or chills.  He reports he has been able to eat normally without pain.  Otherwise no associated symptoms.    Home Medications Prior to Admission medications   Medication Sig Start Date End Date Taking? Authorizing Provider  acetaminophen (TYLENOL) 500 MG tablet Take 1,000 mg by mouth every 8 (eight) hours as needed for moderate pain.    [provider]  albuterol (PROVENTIL HFA;VENTOLIN HFA) 108 (90 Base) MCG/ACT inhaler Inhale 1 puff into the lungs every 6 (six) hours as needed for wheezing or shortness of breath.    [provider]  methocarbamol 1000 MG TABS Take 1,000 mg by mouth 3 (three) times daily. 04/22/22   Manus Ruddsuei, Matthew, MD  oxyCODONE (OXY IR/ROXICODONE) 5 MG immediate release tablet Take 1 tablet (5 mg total) by mouth every 4 (four) hours as needed for moderate pain. 04/22/22   Manus Ruddsuei, Matthew, MD      Allergies    Patient has no known allergies.    Review of Systems   Review of Systems 10 systems reviewed negative except as per Physical Exam Updated Vital Signs BP (!) 147/90   Pulse 85   Temp 98 F (36.7 C) (Oral)   Resp 20   Ht 5\' 11"  (1.803 m)   Wt 119.3 kg   SpO2 96%   BMI 36.68 kg/m  Physical Exam Constitutional:      Comments: Alert nontoxic.  No respiratory distress  HENT:     Mouth/Throat:      Pharynx: Oropharynx is clear.  Eyes:     Extraocular Movements: Extraocular movements intact.  Cardiovascular:     Rate and Rhythm: Normal rate and regular rhythm.  Pulmonary:     Effort: Pulmonary effort is normal.     Breath sounds: Normal breath sounds.  Abdominal:     Comments: Patient has colostomy left lower abdomen.  Some tenderness in the left lower quadrant and suprapubic area.  No guarding.  Musculoskeletal:        General: No swelling or tenderness. Normal range of motion.     Right lower leg: No edema.     Left lower leg: No edema.  Skin:    General: Skin is warm and dry.  Neurological:     General: No focal deficit present.     Mental Status: He is oriented to person, place, and time.     Motor: No weakness.     Coordination: Coordination normal.  Psychiatric:        Mood and Affect: Mood normal.     ED Results / Procedures / Treatments   Labs (all labs ordered are listed, but only abnormal results are displayed) Labs Reviewed  URINALYSIS, ROUTINE W REFLEX MICROSCOPIC - Abnormal; Notable for the following components:  Result Value   Color, Urine RED (*)    APPearance TURBID (*)    Glucose, UA   (*)    Value: TEST NOT REPORTED DUE TO COLOR INTERFERENCE OF URINE PIGMENT   Hgb urine dipstick   (*)    Value: TEST NOT REPORTED DUE TO COLOR INTERFERENCE OF URINE PIGMENT   Bilirubin Urine   (*)    Value: TEST NOT REPORTED DUE TO COLOR INTERFERENCE OF URINE PIGMENT   Ketones, ur   (*)    Value: TEST NOT REPORTED DUE TO COLOR INTERFERENCE OF URINE PIGMENT   Protein, ur   (*)    Value: TEST NOT REPORTED DUE TO COLOR INTERFERENCE OF URINE PIGMENT   Nitrite   (*)    Value: TEST NOT REPORTED DUE TO COLOR INTERFERENCE OF URINE PIGMENT   Leukocytes,Ua   (*)    Value: TEST NOT REPORTED DUE TO COLOR INTERFERENCE OF URINE PIGMENT   RBC / HPF >50 (*)    WBC, UA >50 (*)    Bacteria, UA MANY (*)    Crystals PRESENT (*)    All other components within normal limits   BASIC METABOLIC PANEL - Abnormal; Notable for the following components:   Glucose, Bld 122 (*)    All other components within normal limits  CBC - Abnormal; Notable for the following components:   RBC 3.57 (*)    Hemoglobin 10.8 (*)    HCT 32.9 (*)    Platelets 551 (*)    All other components within normal limits  URINE CULTURE  CULTURE, BLOOD (ROUTINE X 2)  CULTURE, BLOOD (ROUTINE X 2)  PROTIME-INR  APTT  LACTIC ACID, PLASMA  LACTIC ACID, PLASMA  TYPE AND SCREEN  ABO/RH    EKG None  Radiology CT Abdomen Pelvis W Contrast  Result Date: 05/05/2022 CLINICAL DATA:  Abdominal pain. EXAM: CT ABDOMEN AND PELVIS WITH CONTRAST TECHNIQUE: Multidetector CT imaging of the abdomen and pelvis was performed using the standard protocol following bolus administration of intravenous contrast. RADIATION DOSE REDUCTION: This exam was performed according to the departmental dose-optimization program which includes automated exposure control, adjustment of the mA and/or kV according to patient size and/or use of iterative reconstruction technique. CONTRAST:  OMNIPAQUE IOHEXOL 300 MG/ML  SOLN COMPARISON:  CT abdomen pelvis dated 04/16/2022. FINDINGS: Evaluation is limited in the absence of oral contrast. Lower chest: The visualized lung bases are clear. No intra-abdominal free air or free fluid. Hepatobiliary: Probable mild fatty liver. No intrahepatic biliary dilatation. The gallbladder is unremarkable. Pancreas: Unremarkable. No pancreatic ductal dilatation or surrounding inflammatory changes. Spleen: Normal in size without focal abnormality. Adrenals/Urinary Tract: The adrenal glands are unremarkable. There is no hydronephrosis on either side. There is thickened appearance of the posterior wall and dome of the bladder with surrounding inflammatory changes. There is tethering of the bladder dome to loop of small bowel in the lower abdomen. There is a 7.2 x 4.0 cm somewhat loculated inflammatory  collection in the pelvis superior and posterior to the bladder which abuts the posterior bladder wall. This may represent an area of fat necrosis/infarct versus developing abscess. Underlying traumatic bladder injury and urinoma is less likely. Stomach/Bowel: Evaluation of the bowel is limited in the absence of oral contrast. Postsurgical changes of distal colon resection with a left lower quadrant colostomy. Multiple descending colon diverticula without active inflammatory changes. There is inflammatory changes surrounding several loops of small bowel in the lower abdomen and pelvis, likely postoperative. Tethering of the  loop of small bowel to the bladder dome with an inflammatory collection within the pelvis posterior and superior to the bladder as above. No evidence of bowel obstruction at this time. The appendix is normal. Vascular/Lymphatic: Mild atherosclerotic calcification of the abdominal aorta. The IVC is unremarkable. No portal venous gas. There is no adenopathy. Reproductive: The prostate and seminal vesicles are grossly unremarkable. No pelvic mass. Other: Small fat containing umbilical hernia. Linear bandlike track in the subcutaneous soft tissues of the right lower quadrant, likely sequela of prior instrumentation. Musculoskeletal: Degenerative changes of the spine. Similar appearance of compression fracture of the superior endplate of T12. No acute osseous pathology. IMPRESSION: 1. Postsurgical changes of distal colon resection with a left lower quadrant colostomy. 2. Inflammatory changes within the pelvis with a somewhat loculated inflammatory collection superior and posterior to the bladder suspicious for an area of fat necrosis versus developing abscess. There is tethering of a loop of small bowel to this inflammatory collection and bladder dome consistent with developing adhesion. No bowel obstruction, drainable fluid collection or abscess at this time. 3. Aortic Atherosclerosis (ICD10-I70.0).  Electronically Signed   By: Elgie Collard M.D.   On: 05/05/2022 22:54    Procedures Procedures    Medications Ordered in ED Medications  lactated ringers infusion ( Intravenous New Bag/Given 05/05/22 2221)  HYDROmorphone (DILAUDID) injection 1 mg (1 mg Intravenous Given 05/05/22 2222)  iohexol (OMNIPAQUE) 300 MG/ML solution 100 mL (100 mLs Intravenous Contrast Given 05/05/22 2230)    ED Course/ Medical Decision Making/ A&P                           Medical Decision Making Amount and/or Complexity of Data Reviewed Labs: ordered. Radiology: ordered.  Risk Prescription drug management.   Patient presents with suprapubic pressure and blood in the urine.  He is approximately 2 weeks postop for colectomy.  Patient reports there was significant amount of diverticular colon adherent around the bladder.  At this time with complex surgery and now prepubic discomfort and blood in the urine we will proceed with CT.  Pain control provided with Dilaudid.  Patient does have leukocytosis, urinalysis is grossly bloody.  Urine culture added as well.  CT scan reviewed by radiology indicates potential fluid collection versus abscess behind the bladder.  Will add lactic acid and blood culture.  Patient's blood pressures are stable.  He is afebrile in the emergency department.  Currently not exhibiting signs of sepsis.  Will consult general surgery to review CT findings.  Dr. Clayborne Dana assumes care at shift change to follow-up on CT results and final disposition.        Final Clinical Impression(s) / ED Diagnoses Final diagnoses:  Gross hematuria  Post-operative state    Rx / DC Orders ED Discharge Orders     None         Arby Barrette, MD 05/06/22 1801

## 2022-05-05 NOTE — ED Provider Triage Note (Signed)
Emergency Medicine Provider Triage Evaluation Note  Paul Gilmore , a 46 y.o. male  was evaluated in triage.  Pt complains of hematuria onset today.  Notes that he recently had a colostomy bag placed last week.  Notes that he has had a gradual increase in blood in his urine today.  Feels a fullness sensation to the suprapubic region.  Denies nausea, vomiting at this time.  Review of Systems  Positive: As per HPI above Negative:   Physical Exam  BP (!) 169/100 (BP Location: Right Arm)   Pulse (!) 105   Temp 98 F (36.7 C) (Oral)   Resp 20   Ht 5\' 11"  (1.803 m)   Wt 119.3 kg   SpO2 99%   BMI 36.68 kg/m  Gen:   Awake, no distress   Resp:  Normal effort  MSK:   Moves extremities without difficulty  Other:    Medical Decision Making  Medically screening exam initiated at 9:21 PM.  Appropriate orders placed.  VAL SCHIAVO was informed that the remainder of the evaluation will be completed by another provider, this initial triage assessment does not replace that evaluation, and the importance of remaining in the ED until their evaluation is complete.  Work-up initiated   Zemirah Krasinski A, PA-C 05/05/22 2122

## 2022-05-05 NOTE — ED Triage Notes (Signed)
Ambulatory to ED with c/o pain while urinating starting today with blood. Hx of urology and abd surgery.

## 2022-05-06 LAB — LACTIC ACID, PLASMA: Lactic Acid, Venous: 0.6 mmol/L (ref 0.5–1.9)

## 2022-05-06 MED ORDER — CEPHALEXIN 500 MG PO CAPS: 500.0000 mg | ORAL_CAPSULE | Freq: Four times a day (QID) | ORAL | 0 refills | Status: DC

## 2022-05-06 NOTE — ED Provider Notes (Signed)
4:41 AM Assumed care from Dr. Donnald Garre, please see their note for full history, physical and decision making until this point. In brief this is a 46 y.o. year old male who presented to the ED tonight with Hematuria     Ct concerning for possible developing abscess. Also with likely UTI. Pending surgical consult.   On my exam, patient feels very well. Abdomen benign aside from mild suprapubic ttp. Labs reassuring. VSS. Pending egs consult.   D/w Dr. Gerrit Friends. Discussed vitals, exam, CT scan and labs. Also discussed concern of ct scan findings specifically in the area of the surgery. As the patient appears well, vital signs are reassuring and labs are reassuring, Dr. Norma Fredrickson agrees that the patient is likely stable for discharge.  He will review the CT scan with the patient's primary surgeon, Dr. Michaell Cowing, in the a.m. and they will contact the patient if anything else needs to be done.  He agrees to start him on Rocephin and send him home on Keflex.  Plan discussed at length with patient and his wife.  I also discussed with them that if he develops a fever, nausea, and vomiting, worsening abdominal pain or any other concerning for symptoms ED return to ER immediately.    Discharge instructions, including strict return precautions for new or worsening symptoms, given. Patient and/or family verbalized understanding and agreement with the plan as described.   Labs, studies and imaging reviewed by myself and considered in medical decision making if ordered. Imaging interpreted by radiology.  Labs Reviewed  URINALYSIS, ROUTINE W REFLEX MICROSCOPIC - Abnormal; Notable for the following components:      Result Value   Color, Urine RED (*)    APPearance TURBID (*)    Glucose, UA   (*)    Value: TEST NOT REPORTED DUE TO COLOR INTERFERENCE OF URINE PIGMENT   Hgb urine dipstick   (*)    Value: TEST NOT REPORTED DUE TO COLOR INTERFERENCE OF URINE PIGMENT   Bilirubin Urine   (*)    Value: TEST NOT REPORTED DUE TO  COLOR INTERFERENCE OF URINE PIGMENT   Ketones, ur   (*)    Value: TEST NOT REPORTED DUE TO COLOR INTERFERENCE OF URINE PIGMENT   Protein, ur   (*)    Value: TEST NOT REPORTED DUE TO COLOR INTERFERENCE OF URINE PIGMENT   Nitrite   (*)    Value: TEST NOT REPORTED DUE TO COLOR INTERFERENCE OF URINE PIGMENT   Leukocytes,Ua   (*)    Value: TEST NOT REPORTED DUE TO COLOR INTERFERENCE OF URINE PIGMENT   RBC / HPF >50 (*)    WBC, UA >50 (*)    Bacteria, UA MANY (*)    Crystals PRESENT (*)    All other components within normal limits  BASIC METABOLIC PANEL - Abnormal; Notable for the following components:   Glucose, Bld 122 (*)    All other components within normal limits  CBC - Abnormal; Notable for the following components:   RBC 3.57 (*)    Hemoglobin 10.8 (*)    HCT 32.9 (*)    Platelets 551 (*)    All other components within normal limits  URINE CULTURE  CULTURE, BLOOD (ROUTINE X 2)  CULTURE, BLOOD (ROUTINE X 2)  PROTIME-INR  APTT  LACTIC ACID, PLASMA  LACTIC ACID, PLASMA  TYPE AND SCREEN  ABO/RH    CT Abdomen Pelvis W Contrast  Final Result      No follow-ups on file.  Bobbiejo Ishikawa, Barbara Cower, MD 05/06/22 [phone]

## 2022-05-06 NOTE — Discharge Instructions (Signed)
Your surgeon will review your CT scan in the morning and ensure there is nothing requiring more prompt follow up.  Please get a repeat urinalysis after this infection clears up to ensure the blood in your urine has improved.   Please return to the ED immediately if you have uncontrolled fevers, vomiting, worsening severe pain or other concerning signs.

## 2022-05-08 LAB — URINE CULTURE: Culture: 10000 — AB

## 2022-05-09 ENCOUNTER — Telehealth: Payer: Self-pay

## 2022-05-09 NOTE — Progress Notes (Addendum)
ED Antimicrobial Stewardship Positive Culture Follow Up   Paul Gilmore is an 46 y.o. male who presented to [facility] on 05/05/2022 with a chief complaint of  Chief Complaint  Patient presents with   Hematuria    Recent Results (from the past 720 hour(s))  Resp Panel by RT-PCR (Flu A&B, Covid) Nasopharyngeal Swab     Status: None   Collection Time: 04/11/22  9:31 PM   Specimen: Nasopharyngeal Swab; Nasopharyngeal(NP) swabs in vial transport medium  Result Value Ref Range Status   SARS Coronavirus 2 by RT PCR NEGATIVE NEGATIVE Final    Comment: (NOTE) SARS-CoV-2 target nucleic acids are NOT DETECTED.  The SARS-CoV-2 RNA is generally detectable in upper respiratory specimens during the acute phase of infection. The lowest concentration of SARS-CoV-2 viral copies this assay can detect is 138 copies/mL. A negative result does not preclude SARS-Cov-2 infection and should not be used as the sole basis for treatment or other patient management decisions. A negative result may occur with  improper specimen collection/handling, submission of specimen other than nasopharyngeal swab, presence of viral mutation(s) within the areas targeted by this assay, and inadequate number of viral copies(<138 copies/mL). A negative result must be combined with clinical observations, patient history, and epidemiological information. The expected result is Negative.  Fact Sheet for Patients:  BloggerCourse.com  Fact Sheet for Healthcare Providers:  SeriousBroker.it  This test is no t yet approved or cleared by the [place] FDA and  has been authorized for detection and/or diagnosis of SARS-CoV-2 by FDA under an Emergency Use Authorization (EUA). This EUA will remain  in effect (meaning this test can be used) for the duration of the COVID-19 declaration under Section 564(b)(1) of the Act, 21 U.S.C.section 360bbb-3(b)(1), unless the authorization is  terminated  or revoked sooner.       Influenza A by PCR NEGATIVE NEGATIVE Final   Influenza B by PCR NEGATIVE NEGATIVE Final    Comment: (NOTE) The Xpert Xpress SARS-CoV-2/FLU/RSV plus assay is intended as an aid in the diagnosis of influenza from Nasopharyngeal swab specimens and should not be used as a sole basis for treatment. Nasal washings and aspirates are unacceptable for Xpert Xpress SARS-CoV-2/FLU/RSV testing.  Fact Sheet for Patients: BloggerCourse.com  Fact Sheet for Healthcare Providers: SeriousBroker.it  This test is not yet approved or cleared by the [place] FDA and has been authorized for detection and/or diagnosis of SARS-CoV-2 by FDA under an Emergency Use Authorization (EUA). This EUA will remain in effect (meaning this test can be used) for the duration of the COVID-19 declaration under Section 564(b)(1) of the Act, 21 U.S.C. section 360bbb-3(b)(1), unless the authorization is terminated or revoked.  Performed at [facility], [address], [place], [place] [address]   Urine Culture     Status: Abnormal   Collection Time: 05/05/22  8:57 PM   Specimen: Urine, Clean Catch  Result Value Ref Range Status   Specimen Description   Final    URINE, CLEAN CATCH Performed at [facility], [address] [place], [place] [address]    Special Requests   Final    NONE Performed at [facility], [address], [place], [place] [address]    Culture 10,000 COLONIES/mL PSEUDOMONAS AERUGINOSA (A)  Final   Report Status 05/08/2022 FINAL  Final   Organism ID, Bacteria PSEUDOMONAS AERUGINOSA (A)  Final      Susceptibility   Pseudomonas aeruginosa - MIC*    CEFTAZIDIME <=  1 SENSITIVE Sensitive     CIPROFLOXACIN <=0.25 SENSITIVE Sensitive     GENTAMICIN <=1 SENSITIVE Sensitive     IMIPENEM 1 SENSITIVE Sensitive     PIP/TAZO <=4 SENSITIVE Sensitive      CEFEPIME 2 SENSITIVE Sensitive     * 10,000 COLONIES/mL PSEUDOMONAS AERUGINOSA  Culture, blood (routine x 2)     Status: None (Preliminary result)   Collection Time: 05/05/22 11:50 PM   Specimen: BLOOD  Result Value Ref Range Status   Specimen Description   Final    BLOOD LEFT ANTECUBITAL Performed at [facility], [address], [place], [place] [address]    Special Requests   Final    BOTTLES DRAWN AEROBIC AND ANAEROBIC Blood Culture adequate volume Performed at [facility], [address], [place], [place] [address]    Culture   Final    NO GROWTH 3 DAYS Performed at [facility], [address] [place], [place] [address]    Report Status PENDING  Incomplete  Culture, blood (routine x 2)     Status: None (Preliminary result)   Collection Time: 05/05/22 11:55 PM   Specimen: BLOOD  Result Value Ref Range Status   Specimen Description   Final    BLOOD BLOOD LEFT HAND Performed at [facility], [address], [place], [place] [address]    Special Requests   Final    BOTTLES DRAWN AEROBIC AND ANAEROBIC Blood Culture adequate volume Performed at [facility], [address] [place], [place] [address]    Culture   Final    NO GROWTH 3 DAYS Performed at [facility], [address], [place], [place] [address]    Report Status PENDING  Incomplete     Patient admitted on 05/05/22 w/ gross hematuria, but no dysuria, urgency or systemic symptoms. He was experiencing suprapubic fullness likely attributable to an abscess spotted on his most recent CT in his abdomen following a partial colectomy w/ colostomy on 04/20/22. UA yielded turbidity and RBC/WBC >50, RBC and WBC likely present d/t hematuria. Culture yielded pseudomonas aeruginosa 10,000 colonies/mL. Patient was discharged (05/06/22) on cephalexin 500 mg caps PO QID and reported in a phone call w/ a provider on 6/13 that his symptoms had  improved greatly.   Spoke to ED provider, recommend no new abx be given, culture report faxed to Dr. Michaell Cowing at [phone]  ED Provider: Army Melia   Zella Ball 05/09/2022, 8:38 AM Clinical Pharmacist Monday - Friday phone -  [phone] Saturday - Sunday phone - [phone]

## 2022-05-09 NOTE — Telephone Encounter (Signed)
Post ED Visit - Positive Culture Follow-up  Culture report reviewed by antimicrobial stewardship pharmacist: [facility] []  , Pharm.D. []  Enzo Bi, Pharm.D., BCPS AQ-ID []  , Pharm.D., BCPS []  Celedonio Miyamoto, Pharm.D., BCPS []  [place], Garvin Fila.D., BCPS, AAHIVP []  , Pharm.D., BCPS, AAHIVP []  Georgina Pillion, PharmD, BCPS []  , PharmD, BCPS []  [place], PharmD, BCPS []  [address], PharmD []  , PharmD, BCPS []  Estella Husk, PharmD  Pharmacy Team []  Lysle Pearl, PharmD []  , PharmD []  Phillips Climes, PharmD []  , Rph []  Agapito Games) , PharmD []  Verlan Friends, PharmD []  , PharmD []  Mervyn Gay, PharmD []  , PharmD []  Vinnie Level, PharmD []  [facility], PharmD []  , PharmD []  Len Childs, PharmD   Positive urine culture Treated with Cephalexin, improving on antibiotics that do not cover this organism. No treatment for now. Urine culture results faxed to Dr. per ED provider Greer Pickerel PA-C request.   05/09/2022, 12:11 PM

## 2022-05-11 LAB — CULTURE, BLOOD (ROUTINE X 2)
Culture: NO GROWTH
Culture: NO GROWTH
Special Requests: ADEQUATE
Special Requests: ADEQUATE

## 2022-05-14 ENCOUNTER — Ambulatory Visit: Payer: Self-pay | Admitting: Surgery

## 2022-05-14 DIAGNOSIS — R739 Hyperglycemia, unspecified: Secondary | ICD-10-CM

## 2022-05-21 ENCOUNTER — Ambulatory Visit (HOSPITAL_COMMUNITY): Payer: Commercial Managed Care - PPO | Admitting: Nurse Practitioner

## 2022-05-28 ENCOUNTER — Ambulatory Visit: Payer: Self-pay | Admitting: Surgery

## 2022-06-01 ENCOUNTER — Encounter (HOSPITAL_COMMUNITY): Payer: Self-pay | Admitting: Nurse Practitioner

## 2022-06-08 ENCOUNTER — Encounter (HOSPITAL_COMMUNITY): Payer: Self-pay | Admitting: Nurse Practitioner

## 2022-06-19 ENCOUNTER — Encounter (HOSPITAL_COMMUNITY): Payer: Self-pay | Admitting: Nurse Practitioner

## 2022-08-02 NOTE — Patient Instructions (Signed)
DUE TO COVID-19 ONLY TWO VISITORS  (aged 47 and older)  ARE ALLOWED TO COME WITH YOU AND STAY IN THE WAITING ROOM ONLY DURING PRE OP AND PROCEDURE.   **NO VISITORS ARE ALLOWED IN THE SHORT STAY AREA OR RECOVERY ROOM!!**  IF YOU WILL BE ADMITTED INTO THE HOSPITAL YOU ARE ALLOWED ONLY FOUR SUPPORT PEOPLE DURING VISITATION HOURS ONLY (7 AM -8PM)   The support person(s) must pass our screening, gel in and out, and wear a mask at all times, including in the patient's room. Patients must also wear a mask when staff or their support person are in the room. Visitors GUEST BADGE MUST BE WORN VISIBLY  One adult visitor may remain with you overnight and MUST be in the room by 8 P.M.     Your procedure is scheduled on: 08/08/22   Report to [facility] Main Entrance    Report to admitting at : 6:15 AM   Call this number if you have problems the morning of surgery [phone].   Clear liquids starting the day before surgery until : 5:30 AM DAY OF SURGERY. Drink plenty clear fluids.  Water Black Coffee (sugar ok, NO MILK/CREAM OR CREAMERS)  Tea (sugar ok, NO MILK/CREAM OR CREAMERS) regular and decaf                             Plain Jell-O (NO RED)                                           Fruit ices (not with fruit pulp, NO RED)                                     Popsicles (NO RED)                                                                  Juice: apple, WHITE grape, WHITE cranberry Sports drinks like Gatorade (NO RED)            DRINK 2 PRESURGERY ENSURE DRINKS THE NIGHT BEFORE SURGERY AT  1000 PM AND 1 PRESURGERY DRINK THE DAY OF THE PROCEDURE 3 HOURS PRIOR TO SCHEDULED SURGERY. NO SOLIDS AFTER MIDNIGHT THE DAY PRIOR TO THE SURGERY. NOTHING BY MOUTH EXCEPT CLEAR LIQUIDS UNTIL THREE HOURS PRIOR TO SCHEDULED SURGERY. PLEASE FINISH PRESURGERY ENSURE DRINK PER SURGEON ORDER 3 HOURS PRIOR TO SCHEDULED SURGERY TIME WHICH NEEDS TO BE COMPLETED AT: 5:30 AM.   The day of surgery:  Drink  ONE (1) Pre-Surgery Clear Ensure or G2 at AM the morning of surgery. Drink in one sitting. Do not sip.  This drink was given to you during your hospital  pre-op appointment visit. Nothing else to drink after completing the  Pre-Surgery Clear Ensure or G2.          If you have questions, please contact your surgeon's office.  FOLLOW BOWEL PREP AND ANY ADDITIONAL PRE OP INSTRUCTIONS YOU RECEIVED FROM YOUR SURGEON'S OFFICE!!!   Oral Hygiene is also important to reduce your  risk of infection.                                    Remember - BRUSH YOUR TEETH THE MORNING OF SURGERY WITH YOUR REGULAR TOOTHPASTE   Do NOT smoke after Midnight   Take these medicines the morning of surgery with A SIP OF WATER: N/A. Use inhalers as usual.  DO NOT TAKE ANY ORAL DIABETIC MEDICATIONS DAY OF YOUR SURGERY  Bring CPAP mask and tubing day of surgery.                              You may not have any metal on your body including hair pins, jewelry, and body piercing             Do not wear  lotions, powders, perfumes/cologne, or deodorant              Men may shave face and neck.   Do not bring valuables to the hospital. [facility] IS NOT             RESPONSIBLE   FOR VALUABLES.   Contacts, dentures or bridgework may not be worn into surgery.   Bring small overnight bag day of surgery.   DO NOT BRING YOUR HOME MEDICATIONS TO THE HOSPITAL. PHARMACY WILL DISPENSE MEDICATIONS LISTED ON YOUR MEDICATION LIST TO YOU DURING YOUR ADMISSION IN THE HOSPITAL!    Patients discharged on the day of surgery will not be allowed to drive home.  Someone NEEDS to stay with you for the first 24 hours after anesthesia.   Special Instructions: Bring a copy of your healthcare power of attorney and living will documents         the day of surgery if you haven't scanned them before.              Please read over the following fact sheets you were given: IF YOU HAVE QUESTIONS ABOUT YOUR PRE-OP INSTRUCTIONS PLEASE CALL  [phone]     [facility] - Preparing for Surgery Before surgery, you can play an important role.  Because skin is not sterile, your skin needs to be as free of germs as possible.  You can reduce the number of germs on your skin by washing with CHG (chlorahexidine gluconate) soap before surgery.  CHG is an antiseptic cleaner which kills germs and bonds with the skin to continue killing germs even after washing. Please DO NOT use if you have an allergy to CHG or antibacterial soaps.  If your skin becomes reddened/irritated stop using the CHG and inform your nurse when you arrive at Short Stay. Do not shave (including legs and underarms) for at least 48 hours prior to the first CHG shower.  You may shave your face/neck. Please follow these instructions carefully:  1.  Shower with CHG Soap the night before surgery and the  morning of Surgery.  2.  If you choose to wash your hair, wash your hair first as usual with your  normal  shampoo.  3.  After you shampoo, rinse your hair and body thoroughly to remove the  shampoo.                           4.  Use CHG as you would any other liquid soap.  You can apply chg  directly  to the skin and wash                       Gently with a scrungie or clean washcloth.  5.  Apply the CHG Soap to your body ONLY FROM THE NECK DOWN.   Do not use on face/ open                           Wound or open sores. Avoid contact with eyes, ears mouth and genitals (private parts).                       Wash face,  Genitals (private parts) with your normal soap.             6.  Wash thoroughly, paying special attention to the area where your surgery  will be performed.  7.  Thoroughly rinse your body with warm water from the neck down.  8.  DO NOT shower/wash with your normal soap after using and rinsing off  the CHG Soap.                9.  Pat yourself dry with a clean towel.            10.  Wear clean pajamas.            11.  Place clean sheets on your bed the night of your  first shower and do not  sleep with pets. Day of Surgery : Do not apply any lotions/deodorants the morning of surgery.  Please wear clean clothes to the hospital/surgery center.  FAILURE TO FOLLOW THESE INSTRUCTIONS MAY RESULT IN THE CANCELLATION OF YOUR SURGERY PATIENT SIGNATURE_________________________________  NURSE SIGNATURE__________________________________  ________________________________________________________________________   Paul Gilmore  An incentive spirometer is a tool that can help keep your lungs clear and active. This tool measures how well you are filling your lungs with each breath. Taking long deep breaths may help reverse or decrease the chance of developing breathing (pulmonary) problems (especially infection) following: A long period of time when you are unable to move or be active. BEFORE THE PROCEDURE  If the spirometer includes an indicator to show your best effort, your nurse or respiratory therapist will set it to a desired goal. If possible, sit up straight or lean slightly forward. Try not to slouch. Hold the incentive spirometer in an upright position. INSTRUCTIONS FOR USE  Sit on the edge of your bed if possible, or sit up as far as you can in bed or on a chair. Hold the incentive spirometer in an upright position. Breathe out normally. Place the mouthpiece in your mouth and seal your lips tightly around it. Breathe in slowly and as deeply as possible, raising the piston or the ball toward the top of the column. Hold your breath for 3-5 seconds or for as long as possible. Allow the piston or ball to fall to the bottom of the column. Remove the mouthpiece from your mouth and breathe out normally. Rest for a few seconds and repeat Steps 1 through 7 at least 10 times every 1-2 hours when you are awake. Take your time and take a few normal breaths between deep breaths. The spirometer may include an indicator to show your best effort. Use the indicator  as a goal to work toward during each repetition. After each set of 10 deep breaths, practice coughing to be sure your  lungs are clear. If you have an incision (the cut made at the time of surgery), support your incision when coughing by placing a pillow or rolled up towels firmly against it. Once you are able to get out of bed, walk around indoors and cough well. You may stop using the incentive spirometer when instructed by your caregiver.  RISKS AND COMPLICATIONS Take your time so you do not get dizzy or light-headed. If you are in pain, you may need to take or ask for pain medication before doing incentive spirometry. It is harder to take a deep breath if you are having pain. AFTER USE Rest and breathe slowly and easily. It can be helpful to keep track of a log of your progress. Your caregiver can provide you with a simple table to help with this. If you are using the spirometer at home, follow these instructions: [place] IF:  You are having difficultly using the spirometer. You have trouble using the spirometer as often as instructed. Your pain medication is not giving enough relief while using the spirometer. You develop fever of 100.5 F (38.1 C) or higher. SEEK IMMEDIATE MEDICAL CARE IF:  You cough up bloody sputum that had not been present before. You develop fever of 102 F (38.9 C) or greater. You develop worsening pain at or near the incision site. MAKE SURE YOU:  Understand these instructions. Will watch your condition. Will get help right away if you are not doing well or get worse. Document Released: 03/25/2007 Document Revised: 02/04/2012 Document Reviewed: 05/26/2007 [facility] Patient Information 2014 [place], [place].   ________________________________________________________________________

## 2022-08-03 ENCOUNTER — Other Ambulatory Visit: Payer: Self-pay

## 2022-08-03 ENCOUNTER — Encounter (HOSPITAL_COMMUNITY): Payer: Self-pay

## 2022-08-03 ENCOUNTER — Other Ambulatory Visit (HOSPITAL_COMMUNITY): Payer: Self-pay

## 2022-08-03 ENCOUNTER — Encounter (HOSPITAL_COMMUNITY)
Admission: RE | Admit: 2022-08-03 | Discharge: 2022-08-03 | Disposition: A | Discharge status: Home / Self Care | Payer: Commercial Managed Care - PPO | Source: Ambulatory Visit | Attending: Surgery | Admitting: Surgery

## 2022-08-03 VITALS — BP 163/109 | HR 54 | Temp 98.0°F | Ht 71.0 in | Wt 267.0 lb

## 2022-08-03 DIAGNOSIS — R739 Hyperglycemia, unspecified: Secondary | ICD-10-CM | POA: Diagnosis not present

## 2022-08-03 DIAGNOSIS — I10 Essential (primary) hypertension: Secondary | ICD-10-CM | POA: Insufficient documentation

## 2022-08-03 DIAGNOSIS — Z01818 Encounter for other preprocedural examination: Secondary | ICD-10-CM | POA: Insufficient documentation

## 2022-08-03 HISTORY — DX: Unspecified asthma, uncomplicated: J45.909

## 2022-08-03 LAB — BASIC METABOLIC PANEL
Anion gap: 7 (ref 5–15)
BUN: 22 mg/dL — ABNORMAL HIGH (ref 6–20)
CO2: 22 mmol/L (ref 22–32)
Calcium: 9.4 mg/dL (ref 8.9–10.3)
Chloride: 109 mmol/L (ref 98–111)
Creatinine, Ser: 1.06 mg/dL (ref 0.61–1.24)
GFR, Estimated: >60 mL/min (ref >60)
Glucose, Bld: 108 mg/dL — ABNORMAL HIGH (ref 70–99)
Potassium: 4.1 mmol/L (ref 3.5–5.1)
Sodium: 138 mmol/L (ref 135–145)

## 2022-08-03 LAB — CBC
HCT: 50.9 % (ref 39.0–52.0)
Hemoglobin: 17.3 g/dL — ABNORMAL HIGH (ref 13.0–17.0)
MCH: 29.8 pg (ref 26.0–34.0)
MCHC: 34 g/dL (ref 30.0–36.0)
MCV: 87.8 fL (ref 80.0–100.0)
Platelets: 345 10*3/uL (ref 150–400)
RBC: 5.8 MIL/uL (ref 4.22–5.81)
RDW: 13.2 % (ref 11.5–15.5)
WBC: 9.7 10*3/uL (ref 4.0–10.5)
nRBC: 0 % (ref 0.0–0.2)

## 2022-08-03 LAB — HEMOGLOBIN A1C
Hgb A1c MFr Bld: 5.6 % (ref 4.8–5.6)
Mean Plasma Glucose: 114.02 mg/dL

## 2022-08-03 NOTE — Progress Notes (Signed)
For Short Stay: COVID SWAB appointment date: Date of COVID positive in last 90 days:  Bowel Prep reminder:   For Anesthesia: PCP - NO PCP Cardiologist -   Chest x-ray -  EKG -  Stress Test -  ECHO -  Cardiac Cath -  Pacemaker/ICD device last checked: Pacemaker orders received: Device Rep notified:  Spinal Cord Stimulator:  Sleep Study -  CPAP -   Fasting Blood Sugar -  Checks Blood Sugar _____ times a day Date and result of last Hgb A1c-  Blood Thinner Instructions: Aspirin Instructions: Last Dose:  Activity level: Can go up a flight of stairs and activities of daily living without stopping and without chest pain and/or shortness of breath   Able to exercise without chest pain and/or shortness of breath   Unable to go up a flight of stairs without chest pain and/or shortness of breath     Anesthesia review: Hx: HTN. Pt. BP was elevated during PAT visit: 163/109. Shanda Bumps PA is aware,she recommended Pt. To go to urgent care.   Patient denies shortness of breath, fever, cough and chest pain at PAT appointment   Patient verbalized understanding of instructions that were given to them at the PAT appointment. Patient was also instructed that they will need to review over the PAT instructions again at home before surgery.

## 2022-08-03 NOTE — Progress Notes (Signed)
Pt. Called RN after PST visit, to tell the nurse that he was prescribed hydrochlorothiazide and amlodipine when he was discharge from the hospital on June.He said he will start taking those medicine to bring his BP down. RN advised pt. To only take amlodipine the morning of surgery.

## 2022-08-07 NOTE — Anesthesia Preprocedure Evaluation (Signed)
Anesthesia Evaluation  Patient identified by MRN, date of birth, ID band Patient awake    Reviewed: Allergy & Precautions, NPO status , Patient's Chart, lab work & pertinent test results  History of Anesthesia Complications Negative for: history of anesthetic complications  Airway Mallampati: III  TM Distance: >3 FB Neck ROM: Full    Dental no notable dental hx. (+) Dental Advisory Given   Pulmonary asthma ,    Pulmonary exam normal        Cardiovascular hypertension, Pt. on medications  Rhythm:Regular Rate:Tachycardia     Neuro/Psych negative neurological ROS     GI/Hepatic Neg liver ROS, GERD  ,  Endo/Other  negative endocrine ROS  Renal/GU negative Renal ROS     Musculoskeletal  (+) Arthritis ,   Abdominal   Peds  Hematology negative hematology ROS (+)   Anesthesia Other Findings   Reproductive/Obstetrics                            Lab Results  Component Value Date   WBC 9.7 08/03/2022   HGB 17.3 (H) 08/03/2022   HCT 50.9 08/03/2022   MCV 87.8 08/03/2022   PLT 345 08/03/2022   Lab Results  Component Value Date   CREATININE 1.06 08/03/2022   BUN 22 (H) 08/03/2022   NA 138 08/03/2022   K 4.1 08/03/2022   CL 109 08/03/2022   CO2 22 08/03/2022    Anesthesia Physical  Anesthesia Plan  ASA: 3 and emergent  Anesthesia Plan: General   Post-op Pain Management: Tylenol PO (pre-op)*, Ketamine IV* and Celebrex PO (pre-op)*   Induction: Intravenous  PONV Risk Score and Plan: 4 or greater and Dexamethasone, Ondansetron, Midazolam, Treatment may vary due to age or medical condition and Scopolamine patch - Pre-op  Airway Management Planned: Oral ETT  Additional Equipment: None  Intra-op Plan:   Post-operative Plan: Extubation in OR  Informed Consent: I have reviewed the patients History and Physical, chart, labs and discussed the procedure including the risks, benefits  and alternatives for the proposed anesthesia with the patient or authorized representative who has indicated his/her understanding and acceptance.     Dental advisory given  Plan Discussed with: CRNA and Anesthesiologist  Anesthesia Plan Comments:        Anesthesia Quick Evaluation

## 2022-08-08 ENCOUNTER — Other Ambulatory Visit (HOSPITAL_COMMUNITY): Payer: Self-pay

## 2022-08-08 ENCOUNTER — Inpatient Hospital Stay (HOSPITAL_COMMUNITY): Payer: Commercial Managed Care - PPO | Admitting: Physician Assistant

## 2022-08-08 ENCOUNTER — Other Ambulatory Visit: Payer: Self-pay

## 2022-08-08 ENCOUNTER — Encounter (HOSPITAL_COMMUNITY)
Admission: RE | Disposition: A | Discharge status: Home / Self Care | Payer: Self-pay | Source: Home / Self Care | Attending: Surgery

## 2022-08-08 ENCOUNTER — Encounter (HOSPITAL_COMMUNITY): Payer: Self-pay | Admitting: Surgery

## 2022-08-08 ENCOUNTER — Inpatient Hospital Stay (HOSPITAL_COMMUNITY)
Admission: RE | Admit: 2022-08-08 | Discharge: 2022-08-12 | DRG: 330 | Disposition: A | Discharge status: Home / Self Care | Payer: Commercial Managed Care - PPO | Attending: Surgery | Admitting: Surgery

## 2022-08-08 ENCOUNTER — Inpatient Hospital Stay (HOSPITAL_COMMUNITY): Payer: Commercial Managed Care - PPO | Admitting: Certified Registered Nurse Anesthetist

## 2022-08-08 DIAGNOSIS — Z79899 Other long term (current) drug therapy: Secondary | ICD-10-CM

## 2022-08-08 DIAGNOSIS — K5651 Intestinal adhesions [bands], with partial obstruction: Secondary | ICD-10-CM | POA: Diagnosis present

## 2022-08-08 DIAGNOSIS — J45909 Unspecified asthma, uncomplicated: Secondary | ICD-10-CM | POA: Diagnosis present

## 2022-08-08 DIAGNOSIS — M1A0720 Idiopathic chronic gout, left ankle and foot, without tophus (tophi): Secondary | ICD-10-CM | POA: Diagnosis present

## 2022-08-08 DIAGNOSIS — K565 Intestinal adhesions [bands], unspecified as to partial versus complete obstruction: Secondary | ICD-10-CM

## 2022-08-08 DIAGNOSIS — Z825 Family history of asthma and other chronic lower respiratory diseases: Secondary | ICD-10-CM

## 2022-08-08 DIAGNOSIS — Z433 Encounter for attention to colostomy: Secondary | ICD-10-CM | POA: Diagnosis not present

## 2022-08-08 DIAGNOSIS — F419 Anxiety disorder, unspecified: Secondary | ICD-10-CM | POA: Diagnosis present

## 2022-08-08 DIAGNOSIS — Z8249 Family history of ischemic heart disease and other diseases of the circulatory system: Secondary | ICD-10-CM | POA: Diagnosis not present

## 2022-08-08 DIAGNOSIS — E875 Hyperkalemia: Secondary | ICD-10-CM | POA: Diagnosis not present

## 2022-08-08 DIAGNOSIS — I10 Essential (primary) hypertension: Secondary | ICD-10-CM | POA: Diagnosis present

## 2022-08-08 DIAGNOSIS — M1990 Unspecified osteoarthritis, unspecified site: Secondary | ICD-10-CM | POA: Diagnosis present

## 2022-08-08 DIAGNOSIS — Z6837 Body mass index (BMI) 37.0-37.9, adult: Secondary | ICD-10-CM

## 2022-08-08 DIAGNOSIS — Z808 Family history of malignant neoplasm of other organs or systems: Secondary | ICD-10-CM

## 2022-08-08 DIAGNOSIS — Z87891 Personal history of nicotine dependence: Secondary | ICD-10-CM | POA: Diagnosis not present

## 2022-08-08 DIAGNOSIS — K5730 Diverticulosis of large intestine without perforation or abscess without bleeding: Secondary | ICD-10-CM | POA: Diagnosis present

## 2022-08-08 DIAGNOSIS — Z9049 Acquired absence of other specified parts of digestive tract: Secondary | ICD-10-CM

## 2022-08-08 DIAGNOSIS — Z800 Family history of malignant neoplasm of digestive organs: Secondary | ICD-10-CM | POA: Diagnosis not present

## 2022-08-08 DIAGNOSIS — K5732 Diverticulitis of large intestine without perforation or abscess without bleeding: Secondary | ICD-10-CM | POA: Diagnosis present

## 2022-08-08 DIAGNOSIS — K56600 Partial intestinal obstruction, unspecified as to cause: Secondary | ICD-10-CM | POA: Diagnosis present

## 2022-08-08 DIAGNOSIS — K5792 Diverticulitis of intestine, part unspecified, without perforation or abscess without bleeding: Secondary | ICD-10-CM | POA: Diagnosis not present

## 2022-08-08 DIAGNOSIS — K219 Gastro-esophageal reflux disease without esophagitis: Secondary | ICD-10-CM | POA: Diagnosis present

## 2022-08-08 DIAGNOSIS — Z811 Family history of alcohol abuse and dependence: Secondary | ICD-10-CM | POA: Diagnosis not present

## 2022-08-08 HISTORY — PX: LYSIS OF ADHESION: SHX5961

## 2022-08-08 HISTORY — PX: PROCTOSCOPY: SHX2266

## 2022-08-08 HISTORY — PX: XI ROBOTIC ASSISTED COLOSTOMY TAKEDOWN: SHX6828

## 2022-08-08 LAB — TYPE AND SCREEN
ABO/RH(D): O NEG
Antibody Screen: NEGATIVE

## 2022-08-08 SURGERY — CLOSURE, COLOSTOMY, ROBOT-ASSISTED
Anesthesia: General

## 2022-08-08 MED ORDER — ONDANSETRON HCL 4 MG PO TABS: 4.0000 mg | ORAL_TABLET | Freq: Four times a day (QID) | ORAL | Status: DC | PRN

## 2022-08-08 MED ORDER — LACTATED RINGERS IV SOLN: INTRAVENOUS | Status: DC

## 2022-08-08 MED ORDER — SIMETHICONE 80 MG PO CHEW: 40.0000 mg | CHEWABLE_TABLET | Freq: Four times a day (QID) | ORAL | Status: DC | PRN

## 2022-08-08 MED ORDER — GABAPENTIN 300 MG PO CAPS
300.0000 mg | ORAL_CAPSULE | ORAL | Status: AC
  Administered 2022-08-08: 300 mg via ORAL
  Filled 2022-08-08: qty 1

## 2022-08-08 MED ORDER — BUPIVACAINE LIPOSOME 1.3 % IJ SUSP: 20.0000 mL | Freq: Once | INTRAMUSCULAR | Status: DC

## 2022-08-08 MED ORDER — AMISULPRIDE (ANTIEMETIC) 5 MG/2ML IV SOLN: 10.0000 mg | Freq: Once | INTRAVENOUS | Status: DC | PRN

## 2022-08-08 MED ORDER — FENTANYL CITRATE PF 50 MCG/ML IJ SOSY
25.0000 ug | PREFILLED_SYRINGE | INTRAMUSCULAR | Status: DC | PRN
  Administered 2022-08-08 (×3): 50 ug via INTRAVENOUS

## 2022-08-08 MED ORDER — STERILE WATER FOR INJECTION IJ SOLN
INTRAMUSCULAR | Status: DC | PRN
  Administered 2022-08-08: 15 mL

## 2022-08-08 MED ORDER — DIPHENHYDRAMINE HCL 12.5 MG/5ML PO ELIX: 12.5000 mg | ORAL_SOLUTION | Freq: Four times a day (QID) | ORAL | Status: DC | PRN

## 2022-08-08 MED ORDER — BUPIVACAINE LIPOSOME 1.3 % IJ SUSP
INTRAMUSCULAR | Status: DC | PRN
  Administered 2022-08-08: 20 mL

## 2022-08-08 MED ORDER — FENTANYL CITRATE (PF) 100 MCG/2ML IJ SOLN
INTRAMUSCULAR | Status: DC | PRN
  Administered 2022-08-08 (×3): 50 ug via INTRAVENOUS
  Administered 2022-08-08: 100 ug via INTRAVENOUS

## 2022-08-08 MED ORDER — ALVIMOPAN 12 MG PO CAPS
12.0000 mg | ORAL_CAPSULE | ORAL | Status: AC
  Administered 2022-08-08: 12 mg via ORAL
  Filled 2022-08-08: qty 1

## 2022-08-08 MED ORDER — SODIUM CHLORIDE 0.9 % IV SOLN: Freq: Three times a day (TID) | INTRAVENOUS | Status: AC | PRN

## 2022-08-08 MED ORDER — HYDROMORPHONE HCL 1 MG/ML IJ SOLN
0.2500 mg | INTRAMUSCULAR | Status: DC | PRN
  Administered 2022-08-08 (×2): 0.5 mg via INTRAVENOUS

## 2022-08-08 MED ORDER — MIDAZOLAM HCL 2 MG/2ML IJ SOLN
INTRAMUSCULAR | Status: AC
  Filled 2022-08-08: qty 2

## 2022-08-08 MED ORDER — ENSURE PRE-SURGERY PO LIQD
296.0000 mL | Freq: Once | ORAL | Status: DC
  Filled 2022-08-08: qty 296

## 2022-08-08 MED ORDER — ENALAPRILAT 1.25 MG/ML IV SOLN: 0.6250 mg | Freq: Four times a day (QID) | INTRAVENOUS | Status: DC | PRN

## 2022-08-08 MED ORDER — TRAMADOL HCL 50 MG PO TABS
50.0000 mg | ORAL_TABLET | Freq: Four times a day (QID) | ORAL | Status: DC | PRN
  Administered 2022-08-08: 100 mg via ORAL
  Filled 2022-08-08: qty 2

## 2022-08-08 MED ORDER — NEOMYCIN SULFATE 500 MG PO TABS: 1000.0000 mg | ORAL_TABLET | ORAL | Status: DC

## 2022-08-08 MED ORDER — CALCIUM POLYCARBOPHIL 625 MG PO TABS
625.0000 mg | ORAL_TABLET | Freq: Two times a day (BID) | ORAL | Status: DC
  Administered 2022-08-08 – 2022-08-12 (×8): 625 mg via ORAL
  Filled 2022-08-08 (×8): qty 1

## 2022-08-08 MED ORDER — STERILE WATER FOR INJECTION IJ SOLN
INTRAMUSCULAR | Status: AC
  Filled 2022-08-08: qty 10

## 2022-08-08 MED ORDER — PHENYLEPHRINE HCL-NACL 20-0.9 MG/250ML-% IV SOLN
INTRAVENOUS | Status: DC | PRN
  Administered 2022-08-08: 20 ug/min via INTRAVENOUS

## 2022-08-08 MED ORDER — ONDANSETRON HCL 4 MG/2ML IJ SOLN
4.0000 mg | Freq: Four times a day (QID) | INTRAMUSCULAR | Status: DC | PRN
  Administered 2022-08-08: 4 mg via INTRAVENOUS
  Filled 2022-08-08: qty 2

## 2022-08-08 MED ORDER — CHLORHEXIDINE GLUCONATE CLOTH 2 % EX PADS: 6.0000 | MEDICATED_PAD | Freq: Once | CUTANEOUS | Status: DC

## 2022-08-08 MED ORDER — PROCHLORPERAZINE EDISYLATE 10 MG/2ML IJ SOLN
5.0000 mg | Freq: Four times a day (QID) | INTRAMUSCULAR | Status: DC | PRN
  Administered 2022-08-08: 10 mg via INTRAVENOUS
  Filled 2022-08-08: qty 2

## 2022-08-08 MED ORDER — ACETAMINOPHEN 500 MG PO TABS
ORAL_TABLET | ORAL | Status: AC
  Filled 2022-08-08: qty 2

## 2022-08-08 MED ORDER — MELATONIN 3 MG PO TABS
3.0000 mg | ORAL_TABLET | Freq: Every evening | ORAL | Status: DC | PRN
  Administered 2022-08-08 – 2022-08-11 (×4): 3 mg via ORAL
  Filled 2022-08-08 (×4): qty 1

## 2022-08-08 MED ORDER — ACETAMINOPHEN 500 MG PO TABS
1000.0000 mg | ORAL_TABLET | ORAL | Status: AC
  Administered 2022-08-08: 1000 mg via ORAL
  Filled 2022-08-08: qty 2

## 2022-08-08 MED ORDER — SODIUM CHLORIDE 0.9 % IV SOLN: 250.0000 mL | INTRAVENOUS | Status: DC | PRN

## 2022-08-08 MED ORDER — ORAL CARE MOUTH RINSE: 15.0000 mL | Freq: Once | OROMUCOSAL | Status: AC

## 2022-08-08 MED ORDER — CELECOXIB 200 MG PO CAPS
200.0000 mg | ORAL_CAPSULE | Freq: Once | ORAL | Status: AC
  Administered 2022-08-08: 200 mg via ORAL
  Filled 2022-08-08: qty 1

## 2022-08-08 MED ORDER — SODIUM CHLORIDE 0.9% FLUSH: 3.0000 mL | INTRAVENOUS | Status: DC | PRN

## 2022-08-08 MED ORDER — PROPOFOL 10 MG/ML IV BOLUS
INTRAVENOUS | Status: AC
  Filled 2022-08-08: qty 20

## 2022-08-08 MED ORDER — METHOCARBAMOL 1000 MG/10ML IJ SOLN
1000.0000 mg | Freq: Four times a day (QID) | INTRAVENOUS | Status: DC | PRN
  Administered 2022-08-08 (×2): 1000 mg via INTRAVENOUS
  Filled 2022-08-08 (×2): qty 1000

## 2022-08-08 MED ORDER — SODIUM CHLORIDE 0.9 % IV SOLN
2.0000 g | INTRAVENOUS | Status: AC
  Administered 2022-08-08: 2 g via INTRAVENOUS
  Filled 2022-08-08: qty 2

## 2022-08-08 MED ORDER — METHOCARBAMOL 500 MG IVPB - SIMPLE MED
INTRAVENOUS | Status: AC
  Filled 2022-08-08: qty 55

## 2022-08-08 MED ORDER — ENOXAPARIN SODIUM 40 MG/0.4ML IJ SOSY
40.0000 mg | PREFILLED_SYRINGE | Freq: Once | INTRAMUSCULAR | Status: AC
  Administered 2022-08-08: 40 mg via SUBCUTANEOUS
  Filled 2022-08-08: qty 0.4

## 2022-08-08 MED ORDER — POLYETHYLENE GLYCOL 3350 17 GM/SCOOP PO POWD: 1.0000 | Freq: Once | ORAL | Status: DC

## 2022-08-08 MED ORDER — HYDRALAZINE HCL 20 MG/ML IJ SOLN: 10.0000 mg | INTRAMUSCULAR | Status: DC | PRN

## 2022-08-08 MED ORDER — KETOROLAC TROMETHAMINE 15 MG/ML IJ SOLN
INTRAMUSCULAR | Status: AC
  Filled 2022-08-08: qty 1

## 2022-08-08 MED ORDER — KETAMINE HCL 10 MG/ML IJ SOLN
INTRAMUSCULAR | Status: DC | PRN
  Administered 2022-08-08 (×3): 20 mg via INTRAVENOUS

## 2022-08-08 MED ORDER — OXYCODONE HCL 5 MG PO TABS
5.0000 mg | ORAL_TABLET | Freq: Four times a day (QID) | ORAL | 0 refills | Status: DC | PRN
  Filled 2022-08-08: qty 20, 5d supply, fill #0

## 2022-08-08 MED ORDER — ALUM & MAG HYDROXIDE-SIMETH 200-200-20 MG/5ML PO SUSP: 30.0000 mL | Freq: Four times a day (QID) | ORAL | Status: DC | PRN

## 2022-08-08 MED ORDER — ENSURE PRE-SURGERY PO LIQD
592.0000 mL | Freq: Once | ORAL | Status: DC
  Filled 2022-08-08: qty 592

## 2022-08-08 MED ORDER — BUPIVACAINE-EPINEPHRINE (PF) 0.25% -1:200000 IJ SOLN
INTRAMUSCULAR | Status: AC
  Filled 2022-08-08: qty 60

## 2022-08-08 MED ORDER — ONDANSETRON HCL 4 MG/2ML IJ SOLN
INTRAMUSCULAR | Status: DC | PRN
  Administered 2022-08-08: 4 mg via INTRAVENOUS

## 2022-08-08 MED ORDER — LACTATED RINGERS IV BOLUS: 1000.0000 mL | Freq: Three times a day (TID) | INTRAVENOUS | Status: DC | PRN

## 2022-08-08 MED ORDER — LACTATED RINGERS IV SOLN: INTRAVENOUS | Status: DC | PRN

## 2022-08-08 MED ORDER — ACETAMINOPHEN 500 MG PO TABS: 1000.0000 mg | ORAL_TABLET | Freq: Once | ORAL | Status: DC

## 2022-08-08 MED ORDER — ROCURONIUM BROMIDE 10 MG/ML (PF) SYRINGE
PREFILLED_SYRINGE | INTRAVENOUS | Status: AC
  Filled 2022-08-08: qty 10

## 2022-08-08 MED ORDER — BISACODYL 5 MG PO TBEC: 20.0000 mg | DELAYED_RELEASE_TABLET | Freq: Once | ORAL | Status: DC

## 2022-08-08 MED ORDER — CHLORHEXIDINE GLUCONATE 0.12 % MT SOLN
15.0000 mL | Freq: Once | OROMUCOSAL | Status: AC
  Administered 2022-08-08: 15 mL via OROMUCOSAL

## 2022-08-08 MED ORDER — SODIUM CHLORIDE 0.9 % IR SOLN
Status: DC | PRN
  Administered 2022-08-08: 1000 mL via INTRAVESICAL

## 2022-08-08 MED ORDER — DEXAMETHASONE SODIUM PHOSPHATE 4 MG/ML IJ SOLN
INTRAMUSCULAR | Status: DC | PRN
  Administered 2022-08-08: 8 mg via INTRAVENOUS

## 2022-08-08 MED ORDER — LIDOCAINE 2% (20 MG/ML) 5 ML SYRINGE
INTRAMUSCULAR | Status: DC | PRN
  Administered 2022-08-08: 100 mg via INTRAVENOUS

## 2022-08-08 MED ORDER — PROPOFOL 500 MG/50ML IV EMUL
INTRAVENOUS | Status: AC
  Filled 2022-08-08: qty 50

## 2022-08-08 MED ORDER — FENTANYL CITRATE PF 50 MCG/ML IJ SOSY
PREFILLED_SYRINGE | INTRAMUSCULAR | Status: AC
  Filled 2022-08-08: qty 2

## 2022-08-08 MED ORDER — HYDROMORPHONE HCL 1 MG/ML IJ SOLN
0.5000 mg | INTRAMUSCULAR | Status: DC | PRN
  Administered 2022-08-08: 2 mg via INTRAVENOUS
  Administered 2022-08-08: 1 mg via INTRAVENOUS
  Administered 2022-08-09 (×6): 2 mg via INTRAVENOUS
  Administered 2022-08-10: 1 mg via INTRAVENOUS
  Administered 2022-08-10 (×2): 2 mg via INTRAVENOUS
  Administered 2022-08-10: 1 mg via INTRAVENOUS
  Administered 2022-08-10 – 2022-08-12 (×10): 2 mg via INTRAVENOUS
  Filled 2022-08-08 (×5): qty 2
  Filled 2022-08-08: qty 1
  Filled 2022-08-08: qty 2
  Filled 2022-08-08: qty 1
  Filled 2022-08-08 (×5): qty 2
  Filled 2022-08-08: qty 1
  Filled 2022-08-08 (×3): qty 2
  Filled 2022-08-08: qty 1
  Filled 2022-08-08: qty 2
  Filled 2022-08-08: qty 1
  Filled 2022-08-08: qty 2
  Filled 2022-08-08: qty 1
  Filled 2022-08-08: qty 2
  Filled 2022-08-08: qty 1

## 2022-08-08 MED ORDER — SODIUM CHLORIDE 0.9% FLUSH
3.0000 mL | Freq: Two times a day (BID) | INTRAVENOUS | Status: DC
  Administered 2022-08-08 – 2022-08-12 (×5): 3 mL via INTRAVENOUS

## 2022-08-08 MED ORDER — LABETALOL HCL 5 MG/ML IV SOLN
INTRAVENOUS | Status: DC | PRN
  Administered 2022-08-08 (×3): 5 mg via INTRAVENOUS

## 2022-08-08 MED ORDER — SODIUM CHLORIDE 0.9 % IV SOLN
2.0000 g | Freq: Two times a day (BID) | INTRAVENOUS | Status: AC
  Administered 2022-08-08: 2 g via INTRAVENOUS
  Filled 2022-08-08: qty 2

## 2022-08-08 MED ORDER — BUPIVACAINE-EPINEPHRINE (PF) 0.25% -1:200000 IJ SOLN
INTRAMUSCULAR | Status: DC | PRN
  Administered 2022-08-08: 60 mL

## 2022-08-08 MED ORDER — MAGIC MOUTHWASH: 15.0000 mL | Freq: Four times a day (QID) | ORAL | Status: DC | PRN

## 2022-08-08 MED ORDER — PROCHLORPERAZINE MALEATE 10 MG PO TABS: 10.0000 mg | ORAL_TABLET | Freq: Four times a day (QID) | ORAL | Status: DC | PRN

## 2022-08-08 MED ORDER — LIP MEDEX EX OINT
TOPICAL_OINTMENT | Freq: Two times a day (BID) | CUTANEOUS | Status: DC
  Administered 2022-08-10: 75 via TOPICAL
  Administered 2022-08-11: 1 via TOPICAL
  Filled 2022-08-08: qty 7

## 2022-08-08 MED ORDER — METRONIDAZOLE 500 MG PO TABS: 1000.0000 mg | ORAL_TABLET | ORAL | Status: DC

## 2022-08-08 MED ORDER — ENOXAPARIN SODIUM 40 MG/0.4ML IJ SOSY
40.0000 mg | PREFILLED_SYRINGE | INTRAMUSCULAR | Status: DC
  Administered 2022-08-09 – 2022-08-12 (×4): 40 mg via SUBCUTANEOUS
  Filled 2022-08-08 (×4): qty 0.4

## 2022-08-08 MED ORDER — HYDROMORPHONE HCL 1 MG/ML IJ SOLN
INTRAMUSCULAR | Status: AC
  Administered 2022-08-08: 0.5 mg via INTRAVENOUS
  Filled 2022-08-08: qty 1

## 2022-08-08 MED ORDER — FENTANYL CITRATE (PF) 250 MCG/5ML IJ SOLN
INTRAMUSCULAR | Status: AC
  Filled 2022-08-08: qty 5

## 2022-08-08 MED ORDER — KETAMINE HCL 10 MG/ML IJ SOLN
INTRAMUSCULAR | Status: AC
  Filled 2022-08-08: qty 1

## 2022-08-08 MED ORDER — DIPHENHYDRAMINE HCL 50 MG/ML IJ SOLN: 12.5000 mg | Freq: Four times a day (QID) | INTRAMUSCULAR | Status: DC | PRN

## 2022-08-08 MED ORDER — SCOPOLAMINE 1 MG/3DAYS TD PT72
1.0000 | MEDICATED_PATCH | TRANSDERMAL | Status: DC
  Administered 2022-08-08: 1.5 mg via TRANSDERMAL
  Filled 2022-08-08: qty 1

## 2022-08-08 MED ORDER — PROPOFOL 10 MG/ML IV BOLUS
INTRAVENOUS | Status: DC | PRN
  Administered 2022-08-08: 200 mg via INTRAVENOUS

## 2022-08-08 MED ORDER — ALVIMOPAN 12 MG PO CAPS: 12.0000 mg | ORAL_CAPSULE | Freq: Two times a day (BID) | ORAL | Status: DC

## 2022-08-08 MED ORDER — METHOCARBAMOL 500 MG PO TABS
1000.0000 mg | ORAL_TABLET | Freq: Three times a day (TID) | ORAL | 1 refills | Status: DC
  Filled 2022-08-08: qty 40, 7d supply, fill #0

## 2022-08-08 MED ORDER — LIDOCAINE HCL 2 % IJ SOLN
INTRAMUSCULAR | Status: AC
  Filled 2022-08-08: qty 20

## 2022-08-08 MED ORDER — METOPROLOL TARTRATE 5 MG/5ML IV SOLN: 5.0000 mg | Freq: Four times a day (QID) | INTRAVENOUS | Status: DC | PRN

## 2022-08-08 MED ORDER — SUGAMMADEX SODIUM 200 MG/2ML IV SOLN
INTRAVENOUS | Status: DC | PRN
  Administered 2022-08-08: 200 mg via INTRAVENOUS

## 2022-08-08 MED ORDER — ROCURONIUM BROMIDE 10 MG/ML (PF) SYRINGE
PREFILLED_SYRINGE | INTRAVENOUS | Status: DC | PRN
  Administered 2022-08-08: 20 mg via INTRAVENOUS
  Administered 2022-08-08: 50 mg via INTRAVENOUS
  Administered 2022-08-08: 100 mg via INTRAVENOUS

## 2022-08-08 MED ORDER — METHOCARBAMOL 500 MG PO TABS: 1000.0000 mg | ORAL_TABLET | Freq: Four times a day (QID) | ORAL | Status: DC | PRN

## 2022-08-08 MED ORDER — KETOROLAC TROMETHAMINE 15 MG/ML IJ SOLN
15.0000 mg | Freq: Once | INTRAMUSCULAR | Status: AC
  Administered 2022-08-08: 15 mg via INTRAVENOUS

## 2022-08-08 MED ORDER — ACETAMINOPHEN 500 MG PO TABS
1000.0000 mg | ORAL_TABLET | Freq: Four times a day (QID) | ORAL | Status: DC
  Administered 2022-08-08 – 2022-08-12 (×14): 1000 mg via ORAL
  Filled 2022-08-08 (×14): qty 2

## 2022-08-08 MED ORDER — ENSURE SURGERY PO LIQD
237.0000 mL | Freq: Two times a day (BID) | ORAL | Status: DC
  Administered 2022-08-10 – 2022-08-11 (×3): 237 mL via ORAL

## 2022-08-08 MED ORDER — FENTANYL CITRATE PF 50 MCG/ML IJ SOSY
PREFILLED_SYRINGE | INTRAMUSCULAR | Status: AC
  Filled 2022-08-08: qty 1

## 2022-08-08 MED ORDER — 0.9 % SODIUM CHLORIDE (POUR BTL) OPTIME
TOPICAL | Status: DC | PRN
  Administered 2022-08-08: 2000 mL

## 2022-08-08 MED ORDER — AMLODIPINE BESYLATE 5 MG PO TABS
5.0000 mg | ORAL_TABLET | Freq: Every day | ORAL | Status: DC
  Administered 2022-08-09 – 2022-08-12 (×4): 5 mg via ORAL
  Filled 2022-08-08 (×5): qty 1

## 2022-08-08 MED ORDER — BUPIVACAINE LIPOSOME 1.3 % IJ SUSP
INTRAMUSCULAR | Status: AC
  Filled 2022-08-08: qty 20

## 2022-08-08 MED ORDER — ALBUTEROL SULFATE (2.5 MG/3ML) 0.083% IN NEBU: 3.0000 mL | INHALATION_SOLUTION | Freq: Four times a day (QID) | RESPIRATORY_TRACT | Status: DC | PRN

## 2022-08-08 MED ORDER — LIDOCAINE HCL (PF) 2 % IJ SOLN
INTRAMUSCULAR | Status: DC | PRN
  Administered 2022-08-08: 1.5 mg/kg/h via INTRADERMAL

## 2022-08-08 MED ORDER — PROMETHAZINE HCL 25 MG/ML IJ SOLN: 6.2500 mg | INTRAMUSCULAR | Status: DC | PRN

## 2022-08-08 MED ORDER — MIDAZOLAM HCL 5 MG/5ML IJ SOLN
INTRAMUSCULAR | Status: DC | PRN
  Administered 2022-08-08: 2 mg via INTRAVENOUS

## 2022-08-08 SURGICAL SUPPLY — 127 items
ADAPTER GOLDBERG URETERAL (ADAPTER) IMPLANT
APPLIER CLIP 5 13 M/L LIGAMAX5 (MISCELLANEOUS)
APPLIER CLIP ROT 10 11.4 M/L (STAPLE)
BAG COUNTER SPONGE SURGICOUNT (BAG) ×1 IMPLANT
BAG URO CATCHER STRL LF (MISCELLANEOUS) ×1 IMPLANT
BLADE EXTENDED COATED 6.5IN (ELECTRODE) IMPLANT
CANNULA REDUC XI 12-8 STAPL (CANNULA)
CANNULA REDUCER 12-8 DVNC XI (CANNULA) IMPLANT
CATH URETL OPEN 5X70 (CATHETERS) IMPLANT
CELLS DAT CNTRL 66122 CELL SVR (MISCELLANEOUS) IMPLANT
CHLORAPREP W/TINT 26 (MISCELLANEOUS) IMPLANT
CLIP APPLIE 5 13 M/L LIGAMAX5 (MISCELLANEOUS) IMPLANT
CLIP APPLIE ROT 10 11.4 M/L (STAPLE) IMPLANT
CLOTH BEACON ORANGE TIMEOUT ST (SAFETY) ×1 IMPLANT
COVER SURGICAL LIGHT HANDLE (MISCELLANEOUS) ×2 IMPLANT
COVER TIP SHEARS 8 DVNC (MISCELLANEOUS) ×1 IMPLANT
COVER TIP SHEARS 8MM DA VINCI (MISCELLANEOUS) ×1
DEVICE TROCAR PUNCTURE CLOSURE (ENDOMECHANICALS) IMPLANT
DRAIN CHANNEL 19F RND (DRAIN) IMPLANT
DRAPE ARM DVNC X/XI (DISPOSABLE) ×4 IMPLANT
DRAPE COLUMN DVNC XI (DISPOSABLE) ×1 IMPLANT
DRAPE DA VINCI XI ARM (DISPOSABLE) ×4
DRAPE DA VINCI XI COLUMN (DISPOSABLE) ×1
DRAPE SURG IRRIG POUCH 19X23 (DRAPES) ×1 IMPLANT
DRSG OPSITE POSTOP 3X4 (GAUZE/BANDAGES/DRESSINGS) IMPLANT
DRSG OPSITE POSTOP 4X10 (GAUZE/BANDAGES/DRESSINGS) IMPLANT
DRSG OPSITE POSTOP 4X6 (GAUZE/BANDAGES/DRESSINGS) IMPLANT
DRSG OPSITE POSTOP 4X8 (GAUZE/BANDAGES/DRESSINGS) IMPLANT
DRSG TEGADERM 2-3/8X2-3/4 SM (GAUZE/BANDAGES/DRESSINGS) ×5 IMPLANT
DRSG TEGADERM 4X4.75 (GAUZE/BANDAGES/DRESSINGS) IMPLANT
ELECT PENCIL ROCKER SW 15FT (MISCELLANEOUS) ×1 IMPLANT
ELECT REM PT RETURN 15FT ADLT (MISCELLANEOUS) ×1 IMPLANT
ENDOLOOP SUT PDS II  0 18 (SUTURE)
ENDOLOOP SUT PDS II 0 18 (SUTURE) IMPLANT
EVACUATOR SILICONE 100CC (DRAIN) IMPLANT
GAUZE SPONGE 2X2 8PLY STRL LF (GAUZE/BANDAGES/DRESSINGS) ×1 IMPLANT
GLOVE ECLIPSE 8.0 STRL XLNG CF (GLOVE) ×3 IMPLANT
GLOVE INDICATOR 8.0 STRL GRN (GLOVE) ×3 IMPLANT
GLOVE SURG LX STRL 7.5 STRW (GLOVE) ×1 IMPLANT
GOWN SRG XL LVL 4 BRTHBL STRL (GOWNS) ×1 IMPLANT
GOWN STRL NON-REIN XL LVL4 (GOWNS) ×1
GOWN STRL REUS W/ TWL LRG LVL3 (GOWN DISPOSABLE) ×1 IMPLANT
GOWN STRL REUS W/ TWL XL LVL3 (GOWN DISPOSABLE) ×4 IMPLANT
GOWN STRL REUS W/TWL LRG LVL3 (GOWN DISPOSABLE) ×1
GOWN STRL REUS W/TWL XL LVL3 (GOWN DISPOSABLE) ×4
GRASPER SUT TROCAR 14GX15 (MISCELLANEOUS) IMPLANT
GUIDEWIRE ANG ZIPWIRE 038X150 (WIRE) IMPLANT
GUIDEWIRE STR DUAL SENSOR (WIRE) IMPLANT
HOLDER FOLEY CATH W/STRAP (MISCELLANEOUS) ×1 IMPLANT
IRRIG SUCT STRYKERFLOW 2 WTIP (MISCELLANEOUS) ×1
IRRIGATION SUCT STRKRFLW 2 WTP (MISCELLANEOUS) ×1 IMPLANT
KIT PROCEDURE DA VINCI SI (MISCELLANEOUS) ×1
KIT PROCEDURE DVNC SI (MISCELLANEOUS) ×1 IMPLANT
KIT SIGMOIDOSCOPE (SET/KITS/TRAYS/PACK) IMPLANT
KIT TURNOVER KIT A (KITS) IMPLANT
MANIFOLD NEPTUNE II (INSTRUMENTS) ×1 IMPLANT
NDL INSUFFLATION 14GA 120MM (NEEDLE) ×1 IMPLANT
NEEDLE INSUFFLATION 14GA 120MM (NEEDLE) ×1 IMPLANT
PACK CARDIOVASCULAR III (CUSTOM PROCEDURE TRAY) ×1 IMPLANT
PACK COLON (CUSTOM PROCEDURE TRAY) ×1 IMPLANT
PACK CYSTO (CUSTOM PROCEDURE TRAY) ×1 IMPLANT
PAD POSITIONING PINK XL (MISCELLANEOUS) ×1 IMPLANT
PROTECTOR NERVE ULNAR (MISCELLANEOUS) ×2 IMPLANT
RELOAD STAPLE 45 3.5 BLU DVNC (STAPLE) IMPLANT
RELOAD STAPLE 45 4.3 GRN DVNC (STAPLE) IMPLANT
RELOAD STAPLE 60 3.5 BLU DVNC (STAPLE) IMPLANT
RELOAD STAPLE 60 4.3 GRN DVNC (STAPLE) IMPLANT
RELOAD STAPLER 3.5X45 BLU DVNC (STAPLE) IMPLANT
RELOAD STAPLER 3.5X60 BLU DVNC (STAPLE) IMPLANT
RELOAD STAPLER 4.3X45 GRN DVNC (STAPLE) IMPLANT
RELOAD STAPLER 4.3X60 GRN DVNC (STAPLE) IMPLANT
RETRACTOR WND ALEXIS 18 MED (MISCELLANEOUS) IMPLANT
RTRCTR WOUND ALEXIS 18CM MED (MISCELLANEOUS)
SCISSORS LAP 5X35 DISP (ENDOMECHANICALS) ×1 IMPLANT
SEAL CANN UNIV 5-8 DVNC XI (MISCELLANEOUS) ×3 IMPLANT
SEAL XI 5MM-8MM UNIVERSAL (MISCELLANEOUS) ×3
SEALER VESSEL DA VINCI XI (MISCELLANEOUS) ×1
SEALER VESSEL EXT DVNC XI (MISCELLANEOUS) ×1 IMPLANT
SOLUTION ELECTROLUBE (MISCELLANEOUS) ×1 IMPLANT
SPIKE FLUID TRANSFER (MISCELLANEOUS) ×1 IMPLANT
STAPLER 45 DA VINCI SURE FORM (STAPLE)
STAPLER 45 SUREFORM DVNC (STAPLE) IMPLANT
STAPLER 60 DA VINCI SURE FORM (STAPLE)
STAPLER 60 SUREFORM DVNC (STAPLE) IMPLANT
STAPLER CANNULA SEAL DVNC XI (STAPLE) ×1 IMPLANT
STAPLER CANNULA SEAL XI (STAPLE) ×1
STAPLER ECHELON POWER CIR 29 (STAPLE) IMPLANT
STAPLER ECHELON POWER CIR 31 (STAPLE) IMPLANT
STAPLER RELOAD 3.5X45 BLU DVNC (STAPLE)
STAPLER RELOAD 3.5X45 BLUE (STAPLE)
STAPLER RELOAD 3.5X60 BLU DVNC (STAPLE)
STAPLER RELOAD 3.5X60 BLUE (STAPLE)
STAPLER RELOAD 4.3X45 GREEN (STAPLE)
STAPLER RELOAD 4.3X45 GRN DVNC (STAPLE)
STAPLER RELOAD 4.3X60 GREEN (STAPLE)
STAPLER RELOAD 4.3X60 GRN DVNC (STAPLE)
STOPCOCK 4 WAY LG BORE MALE ST (IV SETS) ×2 IMPLANT
SURGILUBE 2OZ TUBE FLIPTOP (MISCELLANEOUS) IMPLANT
SUT MNCRL AB 4-0 PS2 18 (SUTURE) ×1 IMPLANT
SUT PDS AB 1 CT1 27 (SUTURE) ×2 IMPLANT
SUT PROLENE 0 CT 2 (SUTURE) IMPLANT
SUT PROLENE 2 0 KS (SUTURE) IMPLANT
SUT PROLENE 2 0 SH DA (SUTURE) IMPLANT
SUT SILK 2 0 (SUTURE)
SUT SILK 2 0 SH CR/8 (SUTURE) IMPLANT
SUT SILK 2-0 18XBRD TIE 12 (SUTURE) IMPLANT
SUT SILK 3 0 (SUTURE)
SUT SILK 3 0 SH CR/8 (SUTURE) ×1 IMPLANT
SUT SILK 3-0 18XBRD TIE 12 (SUTURE) IMPLANT
SUT V-LOC BARB 180 2/0GR6 GS22 (SUTURE)
SUT VIC AB 3-0 SH 18 (SUTURE) IMPLANT
SUT VIC AB 3-0 SH 27 (SUTURE)
SUT VIC AB 3-0 SH 27XBRD (SUTURE) IMPLANT
SUT VICRYL 0 UR6 27IN ABS (SUTURE) ×1 IMPLANT
SUTURE V-LC BRB 180 2/0GR6GS22 (SUTURE) IMPLANT
SYR 10ML ECCENTRIC (SYRINGE) ×1 IMPLANT
SYS LAPSCP GELPORT 120MM (MISCELLANEOUS)
SYS WOUND ALEXIS 18CM MED (MISCELLANEOUS) ×1
SYSTEM LAPSCP GELPORT 120MM (MISCELLANEOUS) IMPLANT
SYSTEM WOUND ALEXIS 18CM MED (MISCELLANEOUS) ×1 IMPLANT
TAPE UMBILICAL 1/8X30 (MISCELLANEOUS) IMPLANT
TOWEL OR NON WOVEN STRL DISP B (DISPOSABLE) ×1 IMPLANT
TRAY FOLEY MTR SLVR 16FR STAT (SET/KITS/TRAYS/PACK) ×1 IMPLANT
TROCAR ADV FIXATION 5X100MM (TROCAR) ×1 IMPLANT
TUBING CONNECTING 10 (TUBING) ×3 IMPLANT
TUBING INSUFFLATION 10FT LAP (TUBING) ×1 IMPLANT
TUBING UROLOGY SET (TUBING) IMPLANT

## 2022-08-08 NOTE — Op Note (Signed)
08/08/2022  12:24 PM  PATIENT:  Paul Gilmore  46 y.o. 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  PROCEDURE:  XI ROBOTIC ASSISTED COLOSTOMY TAKEDOWN LYSIS OF ADHESIONS RIGID PROCTOSCOPY  SURGEON:  Paul Sportsman, MD  ASSISTANT: Paul Levee, MD, FACS, FASCRS Paul Spray, PA-S, [facility]  ANESTHESIA:   local and general  EBL:  Total I/O In: 100 [IV Piggyback:100] Out: -   Delay start of Pharmacological VTE agent (>24hrs) due to surgical blood loss or risk of bleeding:  no  DRAINS: none   SPECIMENS:   -End colostomy -Distal anastomotic ring at rectum  DISPOSITION OF SPECIMEN:  PATHOLOGY  COUNTS:  YES  PLAN OF CARE: Admit to inpatient   PATIENT DISPOSITION:  PACU - hemodynamically stable.  INDICATION: Pleasant patient status post colectomy with end ostomy.  The patient has recovered from that surgery and has understandably requested ostomy takedown.  Medically stabilized and felt reasonable to proceed.   I discussed the procedure with the patient:  The anatomy & physiology of the digestive tract was discussed.  The pathophysiology was discussed.  Possibility of remaining with an ostomy permanently was discussed.  I offered ostomy takedown.  Minimally invasive & open techniques were discussed.   Risks such as bleeding, infection, abscess, leak, reoperation, possible re-ostomy, injury to other organs, hernia, heart attack, death, and other risks were discussed.   I noted a good likelihood this will help address the problem.  Goals of post-operative recovery were discussed as well.  We will work to minimize  complications.  Questions were answered.  The patient expresses understanding & wishes to proceed with surgery.  OR FINDINGS: Moderately dense interloop adhesions of small intestine omentum to anterior abdominal wall.  Dense interloop adhesions to pelvis.  Retracted rectal stump and lower intraperitoneal pelvis.  Normal anatomy.  Patient has a 31 EEA distal descending to proximal rectum anastomosis.  It is 14 cm from the anal verge.   CASE DATA:  Type of patient?: Elective [facility] Private Case  Status of Case? Elective Scheduled  Infection Present At Time Of Surgery (PATOS)?  NO  DESCRIPTION:   Informed consent was confirmed.  The patient underwent general anaesthesia without difficulty.  The patient was positioned appropriately.  VTE prevention in place.  Patient underwent cystoscopy with ICG firefly ureteral injections by Dr. Liliane Gilmore with [facility] per my request.  Please see his separate operative report.  The patient's abdomen was clipped, prepped, & draped in a sterile fashion.  Surgical timeout confirmed our plan.  Peritoneal entry with a laparoscopic port was obtained using Varess spring needle entry technique in the left upper abdomen as the patient was positioned in reverse Trendelenburg.  I induced carbon dioxide insufflation.  No change in end tidal CO2 measurements.  Full symmetrical abdominal distention.  Initial port was carefully placed.  Camera inspection revealed no injury.  Extra ports were carefully placed under direct laparoscopic visualization.  Xi robot carefully docked & instruments placed.  We then continued with minimally invasive exploration.  I worked to free adhesions to the anterior abdominal wall parietal peritoneum.  Freed omental adhesions off the colostomy and visceral peritoneum.  Make sure to free all small bowel interloop adhesions  and adhesions to the left retroperitoneum and colostomy mesentery and pelvis.  We inspected the small intestine to ensure there  is no injury or other surprises.  This took quite some time.  A lot of internal bands.  Some partial internal herniation.  One area with some dilated bowel suspicious for a chronic partial obstruction.  However nothing too severe.  Everything opened up.  Hemostasis was good.    We focused on dissection down in the pelvis.  Work to free adhesions and identify the rectal stump.  Freed off visceral peritoneum along the right and left anterior rectal pelvic reflection.  Work to come behind the rectal stump and the presacral plane to help elevate the mesorectum off the sacral promontory to be sure we adequately identified that stump was truly rectum.  No we can see the right ureter being pulled or the midline retroperitoneum but stayed in the retroperitoneum.  Some unprotected the left ureter courses it remained in the retroperitoneum.  Passed up EEA sizers transanally and was able to get a 33 EEA sizer to come up the rectum up to the staple line gradually.  With enough mobility we felt we do not need to do any further resection of the remaining rectal stump.  Hemostasis was good.  Ran the small bowel and freed off a few interloop adhesions.  Serosa intact without injury or other concern.  Small bowel dilating up safely with no transition zones.  No stricturing.  We did reinspection and saw no injury or other concerns.  Felt it was safe to proceed with colostomy takedown.  We made an incision around the ostomy.  I got into the subcutaneous tissues.  I used careful focused right angle dissection and sharp dissection.  Some focused cautery dissection as well.  That helped to free adhesions to the subcutaneous tisses & fascia.  I was able to enter into the peritoneum focally.  I did a gentle finger sweep.  Gradually came around circumferentially and freed the bowel from remaining adhesions to the abdominal wall.  We were able eviscerate the ostomy to do inspection and assured viability and hemostasis.  I chose as distal  the location that was viable for the proximal end of the anastomosis.  I clamped the colon at the point of resection using a reusable pursestringer device.  Passed a 2-0 Keith needle. I transected at the descending/sigmoid junction with a scalpel. I got healthy bleeding mucosa.  We sent the rectosigmoid colon specimen off to go to pathology.  We sized the colon orifice.   I chose a 37mm EEA anvil stapler system.  I reinforced the prolene pursestring with interrupted silk "belt loop" sutures.  I placed the anvil to the open end of the proximal remaining colon and closed around it using the pursestring.   Returned the viscera back into the abdomen and did inspection of the abdomen.  We did copious irrigation with crystalloid solution.  Hemostasis was good.  The distal end of the colon at the handle easily reached down to the rectal stump, therefore, splenic flexure mobilization was not needed.      Dr Marcello Moores scrubbed down and did gentle anal dilation and advanced the EEA stapler up the rectal stump. The spike was brought out at the provimal end of the rectal stump under direct visualization.  I  attached the anvil of the proximal colon the spike of the stapler. Anvil was tightened down and held clamped for 60 seconds.  Orientation was confirmed such that  there is no twisting of the colon nor small bowel underneath the mesenteric defect. No concerning tension.  The EEA stapler was fired and held clamped for 30 seconds. The stapler was released & removed. Blue stitch is in the proximal ring.  Care was taken to ensure no other structures were incorporated within this either.  We noted 2 excellent anastomotic rings.  The colon proximal to the anastomosis was then gently occluded. The pelvis was filled with sterile irrigation.  Dr Maisie Fus insufflate did not cross the colorectal anastomosis transanally.  There was a negative air leak test.  They did rigid proctoscopy noted the anastomosis was at 14 cm from the anal verge  consistent with the proximal rectum.  There was no tension of mesentery or bowel at the anastomosis.   Tissues looked viable.  Ureters & bowel uninjured.  The anastomosis looked healthy. Greater omentum positioned down into the pelvis to help protect the anastomosis.    I removed CO2 gas out through the ports.  Ports and will protector and instruments removed.  We changed gown and gloves.  The patient was re-draped.  Sterile unused instruments were used from this point out per colon SSI prevention protocol.  I closed the 69mm port sites using Monocryl stitch and sterile dressing.  I closed the fascia of the abdominal wall ostomy wound using vertical 0 Vicryl suture in the posterior rectus fascia to help reapproximate the muscle as well.  Then another layer along the anterior rectus fascia transversely using running #1 PDS. I closed the skin with some interrupted Monocryl stitches. I placed antibiotic-soaked wicks into the closure at the corners x2. I placed a sterile dressing.    Patient is being extubated go to recovery room. I discussed postop care with the patient in detail the office & in the holding area. Instructions are written. I discussed operative findings, updated the patient's status, discussed probable steps to recovery, and gave postoperative recommendations to the patient's spouse, Mykel Blank.  Recommendations were made.  Questions were answered.  She expressed understanding & appreciation.    Paul Gilmore, M.D., F.A.C.S. Gastrointestinal and Minimally Invasive Surgery [facility] [address] [place], [place] [address] [phone] Main / Paging

## 2022-08-08 NOTE — H&P (Signed)
08/08/2022    PROVIDER: Jarrett Soho, MD  Patient Care Team: Myra Rude, MD as PCP - General (Family Medicine) Michaell Cowing, Shawn Route, MD as Consulting Provider (General Surgery) Armbruster, Reeves Forth, MD (Gastroenterology)  [facility] MRN: [id number] DOB: Feb 12, 1976 DATE OF ENCOUNTER: 05/28/2022  Interval History:   The patient returns to the office after undergoing urgent robotic low anterior rectosigmoid resection with colostomy for chronic diverticulitis stricture with contained abscess and perforation. 04/17/2022.  Pathology shows diverticulosis and acute diverticulitis with a pericolonic abscess consistent with perforation. Negative for malignancy.  Patient comes in for second postop visit. Walking well. Feeling well. Surgery for colostomy takedown scheduled in mid September. Appetite is better. He is adjusted his colostomy pouch.. It seems to be working well. Much less sensitive. Energy level coming back.    Labs, Imaging and Diagnostic Testing:  Located in 'Care Everywhere' section of Epic EMR chart  PRIOR [facility] CLINIC NOTES:  Located in 'Care Everywhere' section of Epic EMR chart  SURGERY NOTES:  Located in 'Care Everywhere' section of Epic EMR chart  04/17/2022  POST-OPERATIVE DIAGNOSIS: RECURRENT DIVERTICULITIS WITH COLON OBSTRUCTION   PROCEDURE:  ROBOTIC low anterior RECTOSIGMOID RESECTION WITH END COLOSTOMY ROBOTIC LYSIS OF ADHESIONS X 2 HOURS BLADDER REPAIR TRANSVERSUS ABDOMINIS PLANE (TAP) BLOCK - BILATERAL  SURGEON: Ardeth Sportsman, MD  OR FINDINGS:   Patient had very dilated colon and even small intestine with transition point at the rectosigmoid junction. Patient had extremely dense concrete adhesions of twisted rectosigmoid colon to the midline retroperitoneum as well as the dome of the bladder and right and left lateral pelvis  Prolene sutures placed on the rectal stump which is at the proximal/mid rectal junction No obvious metastatic disease  on visceral parietal peritoneum or liver.  PATHOLOGY:  Located in 'Care Everywhere' section of Epic EMR chart  Physical Examination:   There is no height or weight on file to calculate BMI.  Constitutional: Not cachectic. Hygeine adequate.  Eyes: Normal extraocular movements. Sclera nonicteric Neuro: No major focal sensory defects. No major motor deficits. Psych: No severe agitation. No severe anxiety. Judgment & insight Adequate, Oriented x4, HENT: Normocephalic, Mucus membranes moist. No thrush.  Neck: Supple, No tracheal deviation.  Chest: Good respiratory excursion. No audible wheezing CV: No major extremity edema Ext: No obvious deformity or contracture. Edema: not present. No cyanosis Skin: Warm and dry Musculoskeletal: Severe joint rigidity not present. Mobility: no assist device moving easily without restrictions  Abdomen: Obese Hernia: Not present. Incisions Clean & dry with normal healing ridge Nontender. Diastasis recti: Not present. Soft. Nondistended.  Left-sided colostomy with some thickening and stricturing within the abdominal wall itself but intact. I feel no Malecot tube.  Gen: Inguinal hernia: Not present. Inguinal lymph nodes: without lymphadenopathy.   Rectal: (Deferred)    Assessment and Plan:   Paul Gilmore is a 46 y.o. male recovering s/p robiotic rectosigmoid Hartman resection for severe rectosigmoid stricture. Pathology consistent with diverticulitis. Underwent colonoscopy 4 years ago..  There are no diagnoses linked to this encounter.   Recovering status post Hartmann resection for recurrent diverticulitis with development of complete colon obstruction due to stricturing from abscess and inflammation. 04/17/2022.  Pathology benign.    Reasonable to consider colostomy takedown today.  The anatomy & physiology of the digestive tract was discussed. The pathophysiology was discussed. Possibility of remaining with an ostomy permanently was discussed.  I offered ostomy takedown. Laparoscopic & open techniques were discussed.   Risks such as bleeding, infection, abscess,  leak, reoperation, possible re-ostomy, injury to other organs, need for repair of tissues / organs, need for further treatment, hernia, heart attack, death, and other risks were discussed. I noted a good likelihood this will help address the problem. Goals of post-operative recovery were discussed as well. We will work to minimize complications. Questions were answered. The patient expresses understanding & wishes to proceed with surgery.  His pathology was benign & he had an underwhelming colonoscopy less than 4 years ago, so most likely do not need to repeat it.    Ardeth Sportsman, MD, FACS, MASCRS Esophageal, Gastrointestinal & Colorectal Surgery Robotic and Minimally Invasive Surgery  [facility] [address] [place], [place] [address] [phone] Fax [phone] Main  CONTACT INFORMATION:  Weekday (9AM-5PM): Call [facility] main office at [phone]  Weeknight (5PM-9AM) or Weekend/Holiday: Check [email or web address] (password " [email or web address]") for General Surgery [facility] coverage  (Please, do not use SecureChat as it is not reliable communication to reach operating surgeons for immediate patient care given surgeries/outpatient duties/clinic/cross-coverage/off post-call which would lead to a delay in care.  Epic staff messaging available for outptient concerns, but may not be answered for 48 hours or more).    08/08/2022

## 2022-08-08 NOTE — Consult Note (Signed)
Urology Consult   Physician requesting consult: Dr. Michaell Cowing  Reason for consult: Firefly injection  History of Present Illness: Paul Gilmore is a 46 y.o. male with a history of diverticulitis requiring colon resection and colostomy formation.  He is here today for colostomy take down and urology has been consulted to perform cystoscopy and firefly ureteral injections to aid in ureteral identification intra-op.   The patient denies a history of voiding or storage urinary symptoms, hematuria, UTIs, STDs, urolithiasis, GU malignancy/trauma/surgery.  Past Medical History:  Diagnosis Date   Allergy    Anxiety    Asthma    Bowel obstruction (HCC)    Diverticulitis of intestine with abscess 03/22/2018   Diverticulitis of large intestine with perforation and abscess 02/2018   GERD (gastroesophageal reflux disease)    HTN (hypertension)     Past Surgical History:  Procedure Laterality Date   COLOSTOMY  04/2022    Current Hospital Medications:  Home Meds:  Current Meds  Medication Sig   amLODipine (NORVASC) 10 MG tablet Take 10 mg by mouth daily.    Scheduled Meds:  bisacodyl  20 mg Oral Once   bupivacaine liposome  20 mL Infiltration Once   Chlorhexidine Gluconate Cloth  6 each Topical Once   And   Chlorhexidine Gluconate Cloth  6 each Topical Once   [START ON 08/09/2022] feeding supplement  296 mL Oral Once   feeding supplement  592 mL Oral Once   scopolamine  1 patch Transdermal Q72H   Continuous Infusions:  cefoTEtan (CEFOTAN) IV     lactated ringers 10 mL/hr at 08/08/22 0651   PRN Meds:.  Allergies: No Known Allergies  Family History  Problem Relation Age of Onset   COPD Mother    Diverticulitis Mother    Alcohol abuse Father    Hypertension Father    Throat cancer Father    Esophageal cancer Father    Colon cancer Neg Hx    Rectal cancer Neg Hx    Stomach cancer Neg Hx     Social History:  reports that he has never smoked. He quit smokeless tobacco use about  8 years ago.  His smokeless tobacco use included chew. He reports current alcohol use. He reports current drug use. Drug: Marijuana.  ROS: A complete review of systems was performed.  All systems are negative except for pertinent findings as noted.  Physical Exam:  Vital signs in last 24 hours: Temp:  [98.1 F (36.7 C)] 98.1 F (36.7 C) (09/13 0710) Pulse Rate:  [97] 97 (09/13 0710) BP: (152)/(110) 152/110 (09/13 0710) SpO2:  [97 %] 97 % (09/13 0710) Weight:  [121.1 kg] 121.1 kg (09/13 0710) Constitutional:  Alert and oriented, No acute distress Cardiovascular: Regular rate and rhythm, No JVD Respiratory: Normal respiratory effort, Lungs clear bilaterally GI: Abdomen is soft, nontender, nondistended, no abdominal masses GU: No CVA tenderness Lymphatic: No lymphadenopathy Neurologic: Grossly intact, no focal deficits Psychiatric: Normal mood and affect  Laboratory Data:  No results for input(s): "WBC", "HGB", "HCT", "PLT" in the last 72 hours.  No results for input(s): "NA", "K", "CL", "GLUCOSE", "BUN", "CALCIUM", "CREATININE" in the last 72 hours.  Invalid input(s): "CO3"   Results for orders placed or performed during the hospital encounter of 08/08/22 (from the past 24 hour(s))  Type and screen [facility]     Status: None   Collection Time: 08/08/22  6:35 AM  Result Value Ref Range   ABO/RH(D) O NEG  Antibody Screen NEG    Sample Expiration      08/11/2022,2359 Performed at [facility], [address] [place], [place] [address]    No results found for this or any previous visit (from the past 240 hour(s)).  Renal Function: Recent Labs    08/03/22 1335  CREATININE 1.06   Estimated Creatinine Clearance: 114.1 mL/min (by C-G formula based on SCr of 1.06 mg/dL).  Radiologic Imaging: No results found.  I independently reviewed the above imaging studies.  Impression/Recommendation 46 year old male with a history of  diverticulitis who is here today for colostomy take down and reanastomosis with Dr. Michaell Cowing   -The risks, benefits and alternatives of cystoscopy with firefly injection was discussed with the patient.  He voices understanding and wishes to proceed.   Rhoderick Moody, MD [facility] 08/08/2022, 8:16 AM

## 2022-08-08 NOTE — Anesthesia Postprocedure Evaluation (Signed)
Anesthesia Post Note  Patient: Paul Gilmore  Procedure(s) Performed: XI ROBOTIC ASSISTED COLOSTOMY TAKEDOWN LYSIS OF ADHESIONS RIGID PROCTOSCOPY CYSTOSCOPY with FIREFLY INJECTION     Patient location during evaluation: PACU Anesthesia Type: General Level of consciousness: sedated Pain management: pain level controlled Vital Signs Assessment: post-procedure vital signs reviewed and stable Respiratory status: spontaneous breathing and respiratory function stable Cardiovascular status: stable Postop Assessment: no apparent nausea or vomiting Anesthetic complications: no   No notable events documented.  Last Vitals:  Vitals:   08/08/22 1430 08/08/22 1445  BP: (!) 127/94 (!) 131/94  Pulse: 84 79  Resp: 19 12  Temp:    SpO2: 96% 97%    Last Pain:  Vitals:   08/08/22 1445  TempSrc:   PainSc: Asleep                 Sigifredo Pignato DANIEL

## 2022-08-08 NOTE — Transfer of Care (Signed)
Immediate Anesthesia Transfer of Care Note  Patient: Paul Gilmore  Procedure(s) Performed: XI ROBOTIC ASSISTED COLOSTOMY TAKEDOWN LYSIS OF ADHESIONS RIGID PROCTOSCOPY CYSTOSCOPY with FIREFLY INJECTION  Patient Location: PACU  Anesthesia Type:General  Level of Consciousness: drowsy  Airway & Oxygen Therapy: Patient Spontanous Breathing and Patient connected to face mask oxygen  Post-op Assessment: Report given to RN and Post -op Vital signs reviewed and stable  Post vital signs: Reviewed and stable  Last Vitals:  Vitals Value Taken Time  BP    Temp    Pulse    Resp    SpO2      Last Pain:  Vitals:   08/08/22 0710  TempSrc: Oral  PainSc:       Patients Stated Pain Goal: 5 (08/08/22 0645)  Complications: No notable events documented.

## 2022-08-08 NOTE — Interval H&P Note (Signed)
History and Physical Interval Note:  08/08/2022 7:35 AM  Paul Gilmore  has presented today for surgery, with the diagnosis of COLOSTOMY FOR COLON RESECTION, DESIRE FOR OSTOMY TAKEDOWN.  The various methods of treatment have been discussed with the patient and family. After consideration of risks, benefits and other options for treatment, the patient has consented to  Procedure(s): XI ROBOTIC ASSISTED COLOSTOMY TAKEDOWN (N/A) LYSIS OF ADHESIONS (N/A) RIGID PROCTOSCOPY (N/A) CYSTOSCOPY with FIREFLY INJECTION (N/A) as a surgical intervention.  The patient's history has been reviewed, patient examined, no change in status, stable for surgery.  I have reviewed the patient's chart and labs.  Questions were answered to the patient's satisfaction.    I have re-reviewed the the patient's records, history, medications, and allergies.  I have re-examined the patient.  I again discussed intraoperative plans and goals of post-operative recovery.  The patient agrees to proceed.  Paul Gilmore  1976-09-15 [id number]  Patient Care Team: Myra Rude, MD as PCP - General (Family Medicine) Armbruster, Willaim Rayas, MD as Consulting Physician (Gastroenterology) Karie Soda, MD as Consulting Physician (General Surgery)  Patient Active Problem List   Diagnosis Date Noted   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 04/11/2022    Priority: High   Sebaceous cyst 04/14/2022   Partial obstruction of colon (HCC) 04/14/2022   Hypertensive urgency 04/12/2022   Chronic idiopathic gout involving toe of left foot without tophus 01/26/2020   Essential hypertension 10/27/2018   Diverticulitis 10/09/2018   Thoracic back pain 04/01/2018   Diverticulitis of intestine with abscess 03/22/2018   Obesity, Class III, BMI 40-49.9 (morbid obesity) (HCC) 03/08/2018   Perforation of sigmoid colon due to diverticulitis 03/03/2018   Hyperglycemia 03/03/2018   Right foot pain 06/14/2017   Great toe  pain, left 03/04/2017   Lower back injury 09/24/2016   Hand laceration 09/24/2016   Neck pain 02/08/2014    Past Medical History:  Diagnosis Date   Allergy    Anxiety    Asthma    Bowel obstruction (HCC)    Diverticulitis of large intestine with perforation and abscess 02/2018   GERD (gastroesophageal reflux disease)    HTN (hypertension)     Past Surgical History:  Procedure Laterality Date   COLOSTOMY  04/2022    Social History   Socioeconomic History   Marital status: Married    Spouse name: Not on file   Number of children: 3   Years of education: Not on file   Highest education level: Not on file  Occupational History   Occupation: Alarm install tech  Tobacco Use   Smoking status: Never   Smokeless tobacco: Former    Types: Chew    Quit date: 06/11/2014  Vaping Use   Vaping Use: Never used  Substance and Sexual Activity   Alcohol use: Yes    Comment: moderate. 2-3 days a week   Drug use: Yes    Types: Marijuana   Sexual activity: Yes  Other Topics Concern   Not on file  Social History Narrative   Not on file   Social Determinants of Health   Financial Resource Strain: Not on file  Food Insecurity: Not on file  Transportation Needs: Not on file  Physical Activity: Not on file  Stress: Not on file  Social Connections: Not on file  Intimate Partner Violence: Not on file    Family History  Problem Relation Age of Onset   COPD Mother    Diverticulitis Mother  Alcohol abuse Father    Hypertension Father    Throat cancer Father    Esophageal cancer Father    Colon cancer Neg Hx    Rectal cancer Neg Hx    Stomach cancer Neg Hx     Medications Prior to Admission  Medication Sig Dispense Refill Last Dose   amLODipine (NORVASC) 10 MG tablet Take 10 mg by mouth daily.   08/08/2022 at 0430   albuterol (PROVENTIL HFA;VENTOLIN HFA) 108 (90 Base) MCG/ACT inhaler Inhale 1 puff into the lungs every 6 (six) hours as needed for wheezing or shortness of  breath.   More than a month   methocarbamol 1000 MG TABS Take 1,000 mg by mouth 3 (three) times daily. (Patient not taking: Reported on 08/01/2022) 20 tablet 0 Not Taking   oxyCODONE (OXY IR/ROXICODONE) 5 MG immediate release tablet Take 1 tablet (5 mg total) by mouth every 4 (four) hours as needed for moderate pain. (Patient not taking: Reported on 08/01/2022) 30 tablet 0 Not Taking    Current Facility-Administered Medications  Medication Dose Route Frequency Provider Last Rate Last Admin   bisacodyl (DULCOLAX) EC tablet 20 mg  20 mg Oral Once Karie Soda, MD       bupivacaine liposome (EXPAREL) 1.3 % injection 266 mg  20 mL Infiltration Once Karie Soda, MD       cefoTEtan (CEFOTAN) 2 g in sodium chloride 0.9 % 100 mL IVPB  2 g Intravenous On Call to OR Karie Soda, MD       Chlorhexidine Gluconate Cloth 2 % PADS 6 each  6 each Topical Once Karie Soda, MD       And   Chlorhexidine Gluconate Cloth 2 % PADS 6 each  6 each Topical Once Karie Soda, MD       [START ON 08/09/2022] feeding supplement (ENSURE PRE-SURGERY) liquid 296 mL  296 mL Oral Once Karie Soda, MD       feeding supplement (ENSURE PRE-SURGERY) liquid 592 mL  592 mL Oral Once Karie Soda, MD       lactated ringers infusion   Intravenous Continuous Val Eagle, MD 10 mL/hr at 08/08/22 0651 New Bag at 08/08/22 0651   scopolamine (TRANSDERM-SCOP) 1 MG/3DAYS 1.5 mg  1 patch Transdermal Ernie Hew, MD   1.5 mg at 08/08/22 0648     No Known Allergies  BP (!) 152/110   Pulse 97   Temp 98.1 F (36.7 C) (Oral)   Ht 5\' 11"  (1.803 m)   Wt 121.1 kg   SpO2 97%   BMI 37.24 kg/m   Labs: Results for orders placed or performed during the hospital encounter of 08/08/22 (from the past 48 hour(s))  [facility]     Status: None   Collection Time: 08/08/22  6:35 AM  Result Value Ref Range   ABO/RH(D) O NEG    Antibody Screen NEG    Sample Expiration       08/11/2022,2359 Performed at [facility], [address] [place], [place]     Imaging / Studies: No results found.   [id number], M.D., F.A.C.S. Gastrointestinal and Minimally Invasive Surgery [facility] [address] [place], [place] [phone] Main / Paging  08/08/2022 7:35 AM    08/10/2022

## 2022-08-08 NOTE — Op Note (Signed)
Operative Note  Preoperative diagnosis:  1.  History of diverticulitis requiring partial colectomy and colostomy  Postoperative diagnosis: Same  Procedure(s): 1.  Cystoscopy with bilateral ureteral FireFly injections  Surgeon: Rhoderick Moody, MD  Assistants:  None   Anesthesia:  General  Complications:  None  EBL:  <5 mL  Specimens: 1. None  Drains/Catheters: 1.  16 French Foley  Intraoperative findings:   No intravesical pathology was seen on cystoscopy  Indication:  The patient is a 46 year old male with a history of reticulitis requiring partial colectomy and colostomy  with Dr. Michaell Cowing.  Urology has been consulted to performed cystoscopy with bilateral ureteral Fire Fly injection to aide in intraoperative ureteral identification. The patient has been consented for the above procedures, voices understanding and wishes to proceed.  The patient has been consented for the above procedures, voices understanding and wishes to procede  Description of procedure: After informed consent was obtained, the patient was brought to the operating room and general endotracheal anesthesia was administered. The patient was then placed in the dorsolithotomy position and prepped and draped in usual sterile fashion. A timeout was performed. A 21 French rigid cystoscope was then inserted into the urethral meatus and advanced into the bladder under direct vision. A complete bladder survey revealed no intravesical pathology.   A 6 [place] open-ended catheter was then used to intubate the right ureteral orifice and a total of 7.5 mL of firefly solution diluted with 10 mL of saline was injected into the right collecting system. A similar maneuver was then carried out on the left with the same volume and concentration of firefly. The rigid cystoscope was then removed under direct vision. A 16 French Foley catheter was then inserted and placed to gravity drainage. The patient tolerated the procedure well.  Dr. Michaell Cowing then proceeded with their portion of the case  Plan:  Foley removal is at the discretion of the primary team.

## 2022-08-08 NOTE — Anesthesia Procedure Notes (Signed)
Procedure Name: Intubation Date/Time: 08/08/2022 8:55 AM  Performed by: Ezekiel Ina, CRNAPre-anesthesia Checklist: Patient identified, Emergency Drugs available, Suction available and Patient being monitored Patient Re-evaluated:Patient Re-evaluated prior to induction Oxygen Delivery Method: Circle system utilized Preoxygenation: Pre-oxygenation with 100% oxygen Induction Type: IV induction Ventilation: Mask ventilation without difficulty Laryngoscope Size: Miller and 3 Grade View: Grade II Tube type: Oral Tube size: 7.5 mm Number of attempts: 1 Airway Equipment and Method: Stylet Placement Confirmation: ETT inserted through vocal cords under direct vision, positive ETCO2 and breath sounds checked- equal and bilateral Secured at: 24 cm Tube secured with: Tape Dental Injury: Teeth and Oropharynx as per pre-operative assessment

## 2022-08-08 NOTE — Discharge Instructions (Signed)
SURGERY: POST OP INSTRUCTIONS (Surgery for small bowel obstruction, colon resection, etc)   ######################################################################  EAT Gradually transition to a high fiber diet with a fiber supplement over the next few days after discharge  WALK Walk an hour a day.  Control your pain to do that.    CONTROL PAIN Control pain so that you can walk, sleep, tolerate sneezing/coughing, go up/down stairs.  HAVE A BOWEL MOVEMENT DAILY Keep your bowels regular to avoid problems.  OK to try a laxative to override constipation.  OK to use an antidairrheal to slow down diarrhea.  Call if not better after 2 tries  CALL IF YOU HAVE PROBLEMS/CONCERNS Call if you are still struggling despite following these instructions. Call if you have concerns not answered by these instructions  ######################################################################   DIET Follow a light diet the first few days at home.  Start with a bland diet such as soups, liquids, starchy foods, low fat foods, etc.  If you feel full, bloated, or constipated, stay on a ful liquid or pureed/blenderized diet for a few days until you feel better and no longer constipated. Be sure to drink plenty of fluids every day to avoid getting dehydrated (feeling dizzy, not urinating, etc.). Gradually add a fiber supplement to your diet over the next week.  Gradually get back to a regular solid diet.  Avoid fast food or heavy meals the first week as you are more likely to get nauseated. It is expected for your digestive tract to need a few months to get back to normal.  It is common for your bowel movements and stools to be irregular.  You will have occasional bloating and cramping that should eventually fade away.  Until you are eating solid food normally, off all pain medications, and back to regular activities; your bowels will not be normal. Focus on eating a low-fat, high fiber diet the rest of your life  (See Getting to Good Bowel Health, below).  CARE of your INCISION or WOUND  It is good for closed incisions and even open wounds to be washed every day.  Shower every day.  Short baths are fine.  Wash the incisions and wounds clean with soap & water.    You may leave closed incisions open to air if it is dry.   You may cover the incision with clean gauze & replace it after your daily shower for comfort.  TEGADERM:  You have clear gauze band-aid dressings over your closed incision(s).  Remove the dressings 3 days after surgery.    If you have an open wound with a wound vac, see wound vac care instructions.    ACTIVITIES as tolerated Start light daily activities --- self-care, walking, climbing stairs-- beginning the day after surgery.  Gradually increase activities as tolerated.  Control your pain to be active.  Stop when you are tired.  Ideally, walk several times a day, eventually an hour a day.   Most people are back to most day-to-day activities in a few weeks.  It takes 4-8 weeks to get back to unrestricted, intense activity. If you can walk 30 minutes without difficulty, it is safe to try more intense activity such as jogging, treadmill, bicycling, low-impact aerobics, swimming, etc. Save the most intensive and strenuous activity for last (Usually 4-8 weeks after surgery) such as sit-ups, heavy lifting, contact sports, etc.  Refrain from any intense heavy lifting or straining until you are off narcotics for pain control.  You will have off days, but   things should improve week-by-week. DO NOT PUSH THROUGH PAIN.  Let pain be your guide: If it hurts to do something, don't do it.  Pain is your body warning you to avoid that activity for another week until the pain goes down. You may drive when you are no longer taking narcotic prescription pain medication, you can comfortably wear a seatbelt, and you can safely make sudden turns/stops to protect yourself without hesitating due to pain. You may  have sexual intercourse when it is comfortable. If it hurts to do something, stop.  MEDICATIONS Take your usually prescribed home medications unless otherwise directed.   Blood thinners:  Usually you can restart any strong blood thinners after the second postoperative day.  It is OK to take aspirin right away.     If you are on strong blood thinners (warfarin/Coumadin, Plavix, Xerelto, Eliquis, Pradaxa, etc), discuss with your surgeon, medicine PCP, and/or cardiologist for instructions on when to restart the blood thinner & if blood monitoring is needed (PT/INR blood check, etc).     PAIN CONTROL Pain after surgery or related to activity is often due to strain/injury to muscle, tendon, nerves and/or incisions.  This pain is usually short-term and will improve in a few months.  To help speed the process of healing and to get back to regular activity more quickly, DO THE FOLLOWING THINGS TOGETHER: Increase activity gradually.  DO NOT PUSH THROUGH PAIN Use Ice and/or Heat Try Gentle Massage and/or Stretching Take over the counter pain medication Take Narcotic prescription pain medication for more severe pain  Good pain control = faster recovery.  It is better to take more medicine to be more active than to stay in bed all day to avoid medications.  Increase activity gradually Avoid heavy lifting at first, then increase to lifting as tolerated over the next 6 weeks. Do not "push through" the pain.  Listen to your body and avoid positions and maneuvers than reproduce the pain.  Wait a few days before trying something more intense Walking an hour a day is encouraged to help your body recover faster and more safely.  Start slowly and stop when getting sore.  If you can walk 30 minutes without stopping or pain, you can try more intense activity (running, jogging, aerobics, cycling, swimming, treadmill, sex, sports, weightlifting, etc.) Remember: If it hurts to do it, then don't do it! Use Ice and/or  Heat You will have swelling and bruising around the incisions.  This will take several weeks to resolve. Ice packs or heating pads (6-8 times a day, 30-60 minutes at a time) will help sooth soreness & bruising. Some people prefer to use ice alone, heat alone, or alternate between ice & heat.  Experiment and see what works best for you.  Consider trying ice for the first few days to help decrease swelling and bruising; then, switch to heat to help relax sore spots and speed recovery. Shower every day.  Short baths are fine.  It feels good!  Keep the incisions and wounds clean with soap & water.   Try Gentle Massage and/or Stretching Massage at the area of pain many times a day Stop if you feel pain - do not overdo it Take over the counter pain medication This helps the muscle and nerve tissues become less irritable and calm down faster Choose ONE of the following over-the-counter anti-inflammatory medications: Acetaminophen 500mg tabs (Tylenol) 1-2 pills with every meal and just before bedtime (avoid if you have liver problems or   if you have acetaminophen in you narcotic prescription) Naproxen 220mg tabs (ex. Aleve, Naprosyn) 1-2 pills twice a day (avoid if you have kidney, stomach, IBD, or bleeding problems) Ibuprofen 200mg tabs (ex. Advil, Motrin) 3-4 pills with every meal and just before bedtime (avoid if you have kidney, stomach, IBD, or bleeding problems) Take with food/snack several times a day as directed for at least 2 weeks to help keep pain / soreness down & more manageable. Take Narcotic prescription pain medication for more severe pain A prescription for strong pain control is often given to you upon discharge (for example: oxycodone/Percocet, hydrocodone/Norco/Vicodin, or tramadol/Ultram) Take your pain medication as prescribed. Be mindful that most narcotic prescriptions contain Tylenol (acetaminophen) as well - avoid taking too much Tylenol. If you are having problems/concerns with  the prescription medicine (does not control pain, nausea, vomiting, rash, itching, etc.), please call us [phone] to see if we need to switch you to a different pain medicine that will work better for you and/or control your side effects better. If you need a refill on your pain medication, you must call the office before 4 pm and on weekdays only.  By federal law, prescriptions for narcotics cannot be called into a pharmacy.  They must be filled out on paper & picked up from our office by the patient or authorized caretaker.  Prescriptions cannot be filled after 4 pm nor on weekends.    WHEN TO CALL US [phone] Severe uncontrolled or worsening pain  Fever over 101 F (38.5 C) Concerns with the incision: Worsening pain, redness, rash/hives, swelling, bleeding, or drainage Reactions / problems with new medications (itching, rash, hives, nausea, etc.) Nausea and/or vomiting Difficulty urinating Difficulty breathing Worsening fatigue, dizziness, lightheadedness, blurred vision Other concerns If you are not getting better after two weeks or are noticing you are getting worse, contact our office [phone] for further advice.  We may need to adjust your medications, re-evaluate you in the office, send you to the emergency room, or see what other things we can do to help. The clinic staff is available to answer your questions during regular business hours (8:30am-5pm).  Please don't hesitate to call and ask to speak to one of our nurses for clinical concerns.    A surgeon from [facility] is always on call at the hospitals 24 hours/day If you have a medical emergency, go to the nearest emergency room or call 911.  FOLLOW UP in our office One the day of your discharge from the hospital (or the next business weekday), please call [facility] to set up or confirm an appointment to see your surgeon in the office for a follow-up appointment.  Usually it is 2-3 weeks  after your surgery.   If you have skin staples at your incision(s), let the office know so we can set up a time in the office for the nurse to remove them (usually around 10 days after surgery). Make sure that you call for appointments the day of discharge (or the next business weekday) from the hospital to ensure a convenient appointment time. IF YOU HAVE DISABILITY OR FAMILY LEAVE FORMS, BRING THEM TO THE OFFICE FOR PROCESSING.  DO NOT GIVE THEM TO YOUR DOCTOR.  [facility] [address], [address], [place], [place]  [address] ? [phone] - Main [phone] - Toll Free,  [phone] - Fax [email or web address]    GETTING TO GOOD BOWEL HEALTH. It is expected for your   digestive tract to need a few months to get back to normal.  It is common for your bowel movements and stools to be irregular.  You will have occasional bloating and cramping that should eventually fade away.  Until you are eating solid food normally, off all pain medications, and back to regular activities; your bowels will not be normal.   Avoiding constipation The goal: ONE SOFT BOWEL MOVEMENT A DAY!    Drink plenty of fluids.  Choose water first. TAKE A FIBER SUPPLEMENT EVERY DAY THE REST OF YOUR LIFE During your first week back home, gradually add back a fiber supplement every day Experiment which form you can tolerate.   There are many forms such as powders, tablets, wafers, gummies, etc Psyllium bran (Metamucil), methylcellulose (Citrucel), Miralax or Glycolax, Benefiber, Flax Seed.  Adjust the dose week-by-week (1/2 dose/day to 6 doses a day) until you are moving your bowels 1-2 times a day.  Cut back the dose or try a different fiber product if it is giving you problems such as diarrhea or bloating. Sometimes a laxative is needed to help jump-start bowels if constipated until the fiber supplement can help regulate your bowels.  If you are tolerating eating & you are farting, it  is okay to try a gentle laxative such as double dose MiraLax, prune juice, or Milk of Magnesia.  Avoid using laxatives too often. Stool softeners can sometimes help counteract the constipating effects of narcotic pain medicines.  It can also cause diarrhea, so avoid using for too long. If you are still constipated despite taking fiber daily, eating solids, and a few doses of laxatives, call our office. Controlling diarrhea Try drinking liquids and eating bland foods for a few days to avoid stressing your intestines further. Avoid dairy products (especially milk & ice cream) for a short time.  The intestines often can lose the ability to digest lactose when stressed. Avoid foods that cause gassiness or bloating.  Typical foods include beans and other legumes, cabbage, broccoli, and dairy foods.  Avoid greasy, spicy, fast foods.  Every person has some sensitivity to other foods, so listen to your body and avoid those foods that trigger problems for you. Probiotics (such as active yogurt, Align, etc) may help repopulate the intestines and colon with normal bacteria and calm down a sensitive digestive tract Adding a fiber supplement gradually can help thicken stools by absorbing excess fluid and retrain the intestines to act more normally.  Slowly increase the dose over a few weeks.  Too much fiber too soon can backfire and cause cramping & bloating. It is okay to try and slow down diarrhea with a few doses of antidiarrheal medicines.   Bismuth subsalicylate (ex. Kayopectate, Pepto Bismol) for a few doses can help control diarrhea.  Avoid if pregnant.   Loperamide (Imodium) can slow down diarrhea.  Start with one tablet (2mg) first.  Avoid if you are having fevers or severe pain.  ILEOSTOMY PATIENTS WILL HAVE CHRONIC DIARRHEA since their colon is not in use.    Drink plenty of liquids.  You will need to drink even more glasses of water/liquid a day to avoid getting dehydrated. Record output from your  ileostomy.  Expect to empty the bag every 3-4 hours at first.  Most people with a permanent ileostomy empty their bag 4-6 times at the least.   Use antidiarrheal medicine (especially Imodium) several times a day to avoid getting dehydrated.  Start with a dose at bedtime & breakfast.    Adjust up or down as needed.  Increase antidiarrheal medications as directed to avoid emptying the bag more than 8 times a day (every 3 hours). Work with your wound ostomy nurse to learn care for your ostomy.  See ostomy care instructions. TROUBLESHOOTING IRREGULAR BOWELS 1) Start with a soft & bland diet. No spicy, greasy, or fried foods.  2) Avoid gluten/wheat or dairy products from diet to see if symptoms improve. 3) Miralax 17gm or flax seed mixed in 8oz. water or juice-daily. May use 2-4 times a day as needed. 4) Gas-X, Phazyme, etc. as needed for gas & bloating.  5) Prilosec (omeprazole) over-the-counter as needed 6)  Consider probiotics (Align, Activa, etc) to help calm the bowels down  Call your doctor if you are getting worse or not getting better.  Sometimes further testing (cultures, endoscopy, X-ray studies, CT scans, bloodwork, etc.) may be needed to help diagnose and treat the cause of the diarrhea. [facility] [address], [address], [place], [place]  [address] [phone] - Main.    [phone]  - Toll Free.   [phone] - Fax [email or web address]  

## 2022-08-09 ENCOUNTER — Encounter (HOSPITAL_COMMUNITY): Payer: Self-pay | Admitting: Surgery

## 2022-08-09 LAB — BASIC METABOLIC PANEL
Anion gap: 12 (ref 5–15)
BUN: 37 mg/dL — ABNORMAL HIGH (ref 6–20)
CO2: 23 mmol/L (ref 22–32)
Calcium: 8.7 mg/dL — ABNORMAL LOW (ref 8.9–10.3)
Chloride: 94 mmol/L — ABNORMAL LOW (ref 98–111)
Creatinine, Ser: 2.82 mg/dL — ABNORMAL HIGH (ref 0.61–1.24)
GFR, Estimated: 27 mL/min — ABNORMAL LOW (ref >60)
Glucose, Bld: 197 mg/dL — ABNORMAL HIGH (ref 70–99)
Potassium: 6.1 mmol/L — ABNORMAL HIGH (ref 3.5–5.1)
Sodium: 129 mmol/L — ABNORMAL LOW (ref 135–145)

## 2022-08-09 LAB — BASIC METABOLIC PANEL WITH GFR
Anion gap: 12 (ref 5–15)
BUN: 34 mg/dL — ABNORMAL HIGH (ref 6–20)
CO2: 21 mmol/L — ABNORMAL LOW (ref 22–32)
Calcium: 8.5 mg/dL — ABNORMAL LOW (ref 8.9–10.3)
Chloride: 96 mmol/L — ABNORMAL LOW (ref 98–111)
Creatinine, Ser: 2.64 mg/dL — ABNORMAL HIGH (ref 0.61–1.24)
GFR, Estimated: 29 mL/min — ABNORMAL LOW (ref >60)
Glucose, Bld: 166 mg/dL — ABNORMAL HIGH (ref 70–99)
Potassium: 6.9 mmol/L (ref 3.5–5.1)
Sodium: 129 mmol/L — ABNORMAL LOW (ref 135–145)

## 2022-08-09 LAB — CBC
HCT: 43.7 % (ref 39.0–52.0)
HCT: 41.5 % (ref 39.0–52.0)
Hemoglobin: 14.3 g/dL (ref 13.0–17.0)
Hemoglobin: 13.6 g/dL (ref 13.0–17.0)
MCH: 30.1 pg (ref 26.0–34.0)
MCH: 29.8 pg (ref 26.0–34.0)
MCHC: 32.7 g/dL (ref 30.0–36.0)
MCHC: 32.8 g/dL (ref 30.0–36.0)
MCV: 92 fL (ref 80.0–100.0)
MCV: 90.8 fL (ref 80.0–100.0)
Platelets: 379 10*3/uL (ref 150–400)
Platelets: 401 10*3/uL — ABNORMAL HIGH (ref 150–400)
RBC: 4.75 MIL/uL (ref 4.22–5.81)
RBC: 4.57 MIL/uL (ref 4.22–5.81)
RDW: 13 % (ref 11.5–15.5)
RDW: 13.1 % (ref 11.5–15.5)
WBC: 24.1 10*3/uL — ABNORMAL HIGH (ref 4.0–10.5)
WBC: 22.2 10*3/uL — ABNORMAL HIGH (ref 4.0–10.5)
nRBC: 0 % (ref 0.0–0.2)
nRBC: 0 % (ref 0.0–0.2)

## 2022-08-09 LAB — MAGNESIUM: Magnesium: 1.9 mg/dL (ref 1.7–2.4)

## 2022-08-09 LAB — SURGICAL PATHOLOGY

## 2022-08-09 MED ORDER — SODIUM CHLORIDE 0.9 % IV SOLN: INTRAVENOUS | Status: DC

## 2022-08-09 MED ORDER — SODIUM CHLORIDE 0.9 % IV BOLUS: 1000.0000 mL | Freq: Three times a day (TID) | INTRAVENOUS | Status: AC | PRN

## 2022-08-09 MED ORDER — ALBUTEROL SULFATE (2.5 MG/3ML) 0.083% IN NEBU
2.5000 mg | INHALATION_SOLUTION | Freq: Four times a day (QID) | RESPIRATORY_TRACT | Status: AC
  Administered 2022-08-09 (×2): 2.5 mg via RESPIRATORY_TRACT
  Filled 2022-08-09 (×2): qty 3

## 2022-08-09 MED ORDER — ALBUTEROL SULFATE (2.5 MG/3ML) 0.083% IN NEBU
2.5000 mg | INHALATION_SOLUTION | Freq: Once | RESPIRATORY_TRACT | Status: AC
  Administered 2022-08-09: 2.5 mg via RESPIRATORY_TRACT
  Filled 2022-08-09: qty 3

## 2022-08-09 NOTE — Progress Notes (Signed)
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  Dense interloop adhesions to pelvis.  Retracted rectal stump and lower intraperitoneal pelvis.  Normal anatomy.   Patient has a 31 EEA distal descending to proximal rectum anastomosis.  It is 14 cm from the anal verge.      Assessment  Patient clinically looks quite well.  Laboratory values concerning for hyperkalemia and AKI  [facility] Stay = 1 days)  Plan:  -Labs certainly are concerning but he is making good urine and does not have any history of any prior AKI or hyperkalemia in my mind.  Repeat labs.  Albuterol nebs for now.  If truly severe then well to more aggressive hyperkalemia protocol and perhaps asked medicine to help stabilize the patient.  Continue ERAS pathway.  Hypertension control.  VTE  prophylaxis- SCDs, etc  mobilize as tolerated to help recovery  Disposition:  Disposition:  The patient is from: Home  Anticipate discharge to:  Home  Anticipated Date of Discharge is:  September 16,2023    Barriers to discharge:  Pending Clinical improvement (more likely than not)  Patient currently is NOT MEDICALLY STABLE for discharge from the hospital from a surgery standpoint.      I reviewed nursing notes, last 24 h vitals and pain scores, last 48 h intake and output, last 24 h labs and trends, and last 24 h imaging results. I have reviewed this patient's available data, including medical history, events of note, test results, etc as part of my evaluation.  A significant portion of that time was spent in counseling.  Care during the described time interval was provided by me.  This care required moderate level of medical decision making.  08/09/2022    Subjective: (Chief complaint)  Patient tolerating liquids.  "This is not nearly as bad as the first time".  Walked in hallways.  Bruising on right-sided port site at first.  Reinforce dressing.  Looks dry now.  Passing gas and had some liquid bowel movements.  In good spirits.  Critical potassium called  Objective:  Vital signs:  Vitals:   08/08/22 2201 08/09/22 0157 08/09/22 0500 08/09/22 0623  BP: 112/84 (!) 127/99  (!) 139/98  Pulse: 92 94  91  Resp: 18 18  18   Temp: 98 F (36.7 C) (!) 97.5 F (36.4 C)  98.5 F (36.9 C)  TempSrc: Oral Oral  Oral  SpO2: 100% 90%  99%  Weight:   128 kg  Height:           Intake/Output   Yesterday:  09/13 0701 - 09/14 0700 In: 3715.8 [P.O.:820; I.V.:2575.8; IV Piggyback:320] Out: 1775 [Urine:1700; Blood:75] This shift:  No intake/output data recorded.  Bowel function:  Flatus: YES  BM:  YES  Drain: (No drain)   Physical Exam:  General: Pt awake/alert in no acute distress Eyes: PERRL, normal EOM.  Sclera clear.  No icterus Neuro: CN II-XII intact  w/o focal sensory/motor deficits. Lymph: No head/neck/groin lymphadenopathy Psych:  No delerium/psychosis/paranoia.  Oriented x 4 HENT: Normocephalic, Mucus membranes moist.  No thrush Neck: Supple, No tracheal deviation.  No obvious thyromegaly Chest: No pain to chest wall compression.  Good respiratory excursion.  No audible wheezing CV:  Pulses intact.  Regular rhythm.  No major extremity edema MS: Normal AROM mjr joints.  No obvious deformity  Abdomen: Soft.  Mildy distended.  Mildly tender at incisions only.  Old blunting on 1 port site.  Minimal bloody drainage from old colostomy wound.  Mild ecchymosis.  No evidence of peritonitis.  No incarcerated hernias.  Ext:   No deformity.  No mjr edema.  No cyanosis Skin: No petechiae / purpurea.  No major sores.  Warm and dry    Results:   Cultures: No results found for this or any previous visit (from the past 720 hour(s)).  Labs: Results for orders placed or performed during the hospital encounter of 08/08/22 (from the past 48 hour(s))  Type and screen [facility]     Status: None   Collection Time: 08/08/22  6:35 AM  Result Value Ref Range   ABO/RH(D) O NEG    Antibody Screen NEG    Sample Expiration      08/11/2022,2359 Performed at [facility], [address], [place], [place] [address]   Basic metabolic panel     Status: Abnormal   Collection Time: 08/09/22  5:07 AM  Result Value Ref Range   Sodium 129 (L) 135 - 145 mmol/L   Potassium 6.9 (HH) 3.5 - 5.1 mmol/L    Comment: CRITICAL RESULT CALLED TO, READ BACK BY AND VERIFIED WITH PAUDEL, B RN @ [phone] 08/09/22. GILBERT, L    Chloride 96 (L) 98 - 111 mmol/L   CO2 21 (L) 22 - 32 mmol/L   Glucose, Bld 166 (H) 70 - 99 mg/dL    Comment: Glucose reference range applies only to samples taken after fasting for at least 8 hours.   BUN 34 (H) 6 - 20 mg/dL   Creatinine, Ser 5.42 (H) 0.61 - 1.24 mg/dL   Calcium 8.5 (L) 8.9 - 10.3 mg/dL   GFR,  Estimated 29 (L) >60 mL/min    Comment: (NOTE) Calculated using the CKD-EPI Creatinine Equation (2021)    Anion gap 12 5 - 15    Comment: Performed at [facility], [address], [place], [place] [address]  CBC     Status: Abnormal   Collection Time: 08/09/22  5:07 AM  Result Value Ref Range   WBC 24.1 (H) 4.0 - 10.5 K/uL   RBC 4.75 4.22 - 5.81 MIL/uL   Hemoglobin 14.3 13.0 - 17.0 g/dL   HCT 76.2 83.1 - 51.7 %   MCV 92.0 80.0 - 100.0 fL   MCH 30.1 26.0 - 34.0 pg   MCHC 32.7 30.0 - 36.0 g/dL   RDW 61.6 07.3 - 71.0 %   Platelets 379 150 - 400 K/uL   nRBC 0.0  0.0 - 0.2 %    Comment: Performed at [facility], [address], [place], [place] [address]  Magnesium     Status: None   Collection Time: 08/09/22  5:07 AM  Result Value Ref Range   Magnesium 1.9 1.7 - 2.4 mg/dL    Comment: Performed at [facility], [address] [place], [place] [address]    Imaging / Studies: No results found.  Medications / Allergies: per chart  Antibiotics: Anti-infectives (From admission, onward)    Start     Dose/Rate Route Frequency Ordered Stop   08/08/22 2200  cefoTEtan (CEFOTAN) 2 g in sodium chloride 0.9 % 100 mL IVPB        2 g 200 mL/hr over 30 Minutes Intravenous Every 12 hours 08/08/22 1540 08/08/22 2240   08/08/22 1400  neomycin (MYCIFRADIN) tablet 1,000 mg  Status:  Discontinued       See Hyperspace for full Linked Orders Report.   1,000 mg Oral 3 times per day 08/08/22 0547 08/08/22 0548   08/08/22 1400  metroNIDAZOLE (FLAGYL) tablet 1,000 mg  Status:  Discontinued       See Hyperspace for full Linked Orders Report.   1,000 mg Oral 3 times per day 08/08/22 0547 08/08/22 0548   08/08/22 0600  cefoTEtan (CEFOTAN) 2 g in sodium chloride 0.9 % 100 mL IVPB        2 g 200 mL/hr over 30 Minutes Intravenous On call to O.R. 08/08/22 0547 08/08/22 0941         Note: Portions of this report may have been transcribed using  voice recognition software. Every effort was made to ensure accuracy; however, inadvertent computerized transcription errors may be present.   Any transcriptional errors that result from this process are unintentional.    Ardeth Sportsman, MD, FACS, MASCRS Esophageal, Gastrointestinal & Colorectal Surgery Robotic and Minimally Invasive Surgery  [facility] [address], [address] [place], [place] [address] [phone] Fax [phone] Main  CONTACT INFORMATION:  Weekday (9AM-5PM): Call [facility] main office at [phone]  Weeknight (5PM-9AM) or Weekend/Holiday: Check [email or web address] (password " [email or web address]") for General Surgery [facility] coverage  (Please, do not use SecureChat as it is not reliable communication to reach operating surgeons for immediate patient care given surgeries/outpatient duties/clinic/cross-coverage/off post-call which would lead to a delay in care.  Epic staff messaging available for outptient concerns, but may not be answered for 48 hours or more).     08/09/2022  7:59 AM

## 2022-08-09 NOTE — TOC Progression Note (Signed)
Transition of Care [facility]) - Progression Note    Patient Details  Name: Paul Gilmore MRN: [id number] Date of Birth: 03-05-1976  Transition of Care [facility]) CM/SW Contact  Coralyn Helling, [place] Phone Number: 08/09/2022, 9:27 AM  Clinical Narrative:       Transition of Care [facility]) Screening Note   Patient Details  Name: Paul Gilmore Date of Birth: 01-Nov-1976   Transition of Care [facility]) CM/SW Contact:    Coralyn Helling, LCSW Phone Number: 08/09/2022, 9:27 AM    Transition of Care Department [facility]) has reviewed patient and no TOC needs have been identified at this time. We will continue to monitor patient advancement through interdisciplinary progression rounds. If new patient transition needs arise, please place a TOC consult.        Expected Discharge Plan and Services                                                 Social Determinants of Health (SDOH) Interventions    Readmission Risk Interventions     No data to display

## 2022-08-09 NOTE — Progress Notes (Signed)
Patient's one of the port site was bleeding. Pressure dressing applied. Will continue to monitor.

## 2022-08-09 NOTE — Progress Notes (Signed)
Pt had critical lab result potassium 6.9.paged oncall MD. And pass it on to day shift RN.

## 2022-08-09 NOTE — Progress Notes (Signed)
PHARMACIST - PHYSICIAN COMMUNICATION  CONCERNING: Alvimopan   RECOMMENDATION: This patient is receiving alvimopan post-operatively.  Based on criteria approved by the Pharmacy and Therapeutics Committee, the medication will be discontinued.  DESCRIPTION: These criteria include: Patient will receive NO MORE than 15 doses total during current hospitalization If bowel recovery (documented return of bowel sounds and a bowel movement confirmed by RN or patient) occurs before completion of 7 days of therapy, a pharmacist may discontinue alvimopan  Alvimopan not given 9/14 am.  If you have questions about this conversion, please contact the Pharmacy Department   Pricilla Riffle, PharmD, BCPS Clinical Pharmacist 08/09/2022 12:32 PM

## 2022-08-09 NOTE — Progress Notes (Signed)
Repeat potassium still elevated but improved to 6.  EKG with no major T wave changes of concern but will place on telemetry at least overnight just in case.  Continue albuterol nebulizations for now.    Normal will consider diuresis but with elevated creatinine, hold off.  Gave saline bolus and will place on maintenance saline for now.  Patient is nonoliguric and feels otherwise well, so I am hopeful this will be a transient issue.  If worsening or not improved by tomorrow, may consider medicine consultation to help follow.

## 2022-08-10 LAB — CBC
HCT: 29.9 % — ABNORMAL LOW (ref 39.0–52.0)
Hemoglobin: 10.3 g/dL — ABNORMAL LOW (ref 13.0–17.0)
MCH: 30.2 pg (ref 26.0–34.0)
MCHC: 34.4 g/dL (ref 30.0–36.0)
MCV: 87.7 fL (ref 80.0–100.0)
Platelets: 278 10*3/uL (ref 150–400)
RBC: 3.41 MIL/uL — ABNORMAL LOW (ref 4.22–5.81)
RDW: 13.1 % (ref 11.5–15.5)
WBC: 12 10*3/uL — ABNORMAL HIGH (ref 4.0–10.5)
nRBC: 0 % (ref 0.0–0.2)

## 2022-08-10 LAB — CREATININE, SERUM
Creatinine, Ser: 1.22 mg/dL (ref 0.61–1.24)
GFR, Estimated: >60 mL/min (ref >60)

## 2022-08-10 LAB — POTASSIUM: Potassium: 3.8 mmol/L (ref 3.5–5.1)

## 2022-08-10 MED ORDER — METHOCARBAMOL 500 MG PO TABS
1000.0000 mg | ORAL_TABLET | Freq: Four times a day (QID) | ORAL | Status: DC
  Administered 2022-08-10 – 2022-08-12 (×9): 1000 mg via ORAL
  Filled 2022-08-10 (×9): qty 2

## 2022-08-10 MED ORDER — TRAMADOL HCL 50 MG PO TABS: 100.0000 mg | ORAL_TABLET | Freq: Once | ORAL | Status: DC

## 2022-08-10 MED ORDER — TRAMADOL HCL 50 MG PO TABS: 100.0000 mg | ORAL_TABLET | Freq: Four times a day (QID) | ORAL | Status: DC | PRN

## 2022-08-10 MED ORDER — OXYCODONE HCL 5 MG PO TABS
5.0000 mg | ORAL_TABLET | Freq: Once | ORAL | Status: AC
  Administered 2022-08-10: 5 mg via ORAL
  Filled 2022-08-10: qty 1

## 2022-08-10 MED ORDER — OXYCODONE HCL 5 MG PO TABS
5.0000 mg | ORAL_TABLET | ORAL | Status: DC | PRN
  Administered 2022-08-10: 10 mg via ORAL
  Administered 2022-08-10: 5 mg via ORAL
  Administered 2022-08-11 – 2022-08-12 (×4): 10 mg via ORAL
  Filled 2022-08-10 (×3): qty 2
  Filled 2022-08-10: qty 1
  Filled 2022-08-10 (×2): qty 2

## 2022-08-10 NOTE — Progress Notes (Signed)
Patient complains of worsening bruises on his lower abdomen. Upon inspection, bruises were markedly larger than initial assessment. RN outlined bruising and will continue to monitor.

## 2022-08-10 NOTE — Progress Notes (Signed)
Paul Gilmore [id number] Sep 24, 1976  CARE TEAM:  PCP: Paul Rude, MD  Outpatient Care Team: Patient Care Team: Paul Rude, MD as PCP - General (Family Medicine) Gilmore, Paul Rayas, MD as Consulting Physician (Gastroenterology) Paul Soda, MD as Consulting Physician (General Surgery)  Inpatient Treatment Team: Treatment Team: Attending Provider: Karie Soda, MD; Mobility Specialist: Paul Gilmore; Registered Nurse: Paul Amen, RN; Licensed Practical Nurse: [name], [name], LPN; Utilization Review: Paul Getting, RN; Pharmacist: Paul Gilmore, [facility]; Social Worker: Paul Santee, LCSW   Problem List:   Principal Problem:   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  Dense interloop adhesions to pelvis.  Retracted rectal stump and lower intraperitoneal pelvis.  Normal anatomy.   Patient has a 31 EEA distal descending to proximal rectum anastomosis.  It is 14 cm from the anal verge.      Assessment  Recovering well  [facility] Stay = 2 days)  Plan:  Hyperkalemia and elevated creatinine now normalized quickly.  Hard to say if there is a lab error but things are improved and correlate better with his clinical appearance of rapidly returning bowel function and normouria.  Reassuring  Advance to solid diet appears to be tolerating.  He is worried about pain control.  I again  reminded him there are pain pill options.  I think he seems to like his Dilaudid.  Continue ice as needed.  Continue scheduled Tylenol.  We will add scheduled methocarbamol.  I suspect he prefers oxycodone to the tramadol.  We will switch.  Continue ERAS pathway.  Hypertension control.  VTE prophylaxis- SCDs, etc  mobilize as tolerated to help recovery  We will round on later today.  Feeling much better possibly leave later today but suspect he will want to stay 1 more day to be safe.  We will see.  Disposition:  Disposition:  The patient is from: Home  Anticipate discharge to:  Home  Anticipated Date of Discharge is:  September 16,2023    Barriers to discharge:  Pending Clinical improvement (more likely than not)  Patient currently is NOT MEDICALLY STABLE for discharge from the hospital from a surgery standpoint.      I reviewed nursing notes, last 24 h vitals and pain scores, last 48 h intake and output, last 24 h labs and trends, and last 24 h imaging results. I have reviewed this patient's available data, including medical history, events of note, test results, etc as part of my evaluation.  A significant portion of that time was spent in counseling.  Care during the described time interval was provided by me.  This care required moderate level of medical decision making.  08/10/2022    Subjective: (Chief complaint)  Patient feeling better.    Little anxious of oozing and bruising.  Has not asked for pain pills.  Prefers his Dilaudid.  Objective:  Vital signs:  Vitals:   08/09/22 2035 08/09/22 2142 08/10/22 0500 08/10/22 0538  BP:  (!) 139/91  Marland Kitchen)  140/87  Pulse:  98  91  Resp:  16  16  Temp:  98.2 F (36.8 C)  98.4 F (36.9 C)  TempSrc:  Oral  Oral  SpO2: 97% 98%  98%  Weight:   129.1 kg   Height:        Last BM Date : 08/09/22  Intake/Output   Yesterday:  09/14 0701 - 09/15 0700 In: 1561.2 [P.O.:240; I.V.:1321.2] Out: 1675 [Urine:1675] This  shift:  Total I/O In: -  Out: 200 [Urine:200]  Bowel function:  Flatus: YES  BM:  YES  Drain: (No drain)   Physical Exam:  General: Pt awake/alert in no acute distress Eyes: PERRL, normal EOM.  Sclera clear.  No icterus Neuro: CN II-XII intact w/o focal sensory/motor deficits. Lymph: No head/neck/groin lymphadenopathy Psych:  No delerium/psychosis/paranoia.  Oriented x 4 mildly anxious but consolable. HENT: Normocephalic, Mucus membranes moist.  No thrush Neck: Supple, No tracheal deviation.  No obvious thyromegaly Chest: No pain to chest wall compression.  Good respiratory excursion.  No audible wheezing CV:  Pulses intact.  Regular rhythm.  No major extremity edema MS: Normal AROM mjr joints.  No obvious deformity  Abdomen: Soft.  Nondistended.  Mildly tender at incisions only.  Some mild ecchymosis at a few port sites.  No active bleeding.  Some ecchymosis on mons.  No evidence of peritonitis.  No incarcerated hernias.  Ext:   No deformity.  No mjr edema.  No cyanosis Skin: No petechiae / purpurea.  No major sores.  Warm and dry    Results:   Cultures: No results found for this or any previous visit (from the past 720 hour(s)).  Labs: Results for orders placed or performed during the hospital encounter of 08/08/22 (from the past 48 hour(s))  Surgical pathology     Status: None   Collection Time: 08/08/22 11:11 AM  Result Value Ref Range   SURGICAL PATHOLOGY      SURGICAL PATHOLOGY CASE: [id number] PATIENT: Paul RiegerPETER Gilmore Surgical Pathology Report     Clinical History: Colostomy for colon resection, desire for ostomy takedown (crm)     FINAL MICROSCOPIC DIAGNOSIS:  A. COLOSTOMY - Segment of colon (4 cm) with stoma  B. FINAL DISTAL ANASTOMOSIS RING: - Colonic donut within normal limits      Paul Gilmore DESCRIPTION:  A: Received fresh is a 4 cm segment of colon with a rim of skin present at one end consistent with an ostomy site.  The mucosa is  glistening and tan.  A section is submitted.  B: Received fresh is a portion of colon measuring 1.2 cm in length and 1.5 cm in diameter.  The mucosa is glistening and tan.  There are sutures present at one end.  Sections are submitted in 1 cassette.  [facility](GRP 08/08/2022)   Final Diagnosis performed by Holley BoucheNilesh Kashikar, MD.   Electronically signed 08/09/2022 Technical and / or Professional components performed at [facility], [address] [place],  [place] [id number].  Immunohistochemistry Technical component (if applicable) was performed at [facility]. [address], [place], [place] [address].   IMMUNOHISTOCHEMISTRY DISCLAIMER (if applicable): Some of these immunohistochemical stains may have been developed and the performance characteristics determine by [facility]. Some may not have been cleared or approved by the U.S. Food and Drug Administration. The FDA has determined that such clearance or approval is not necessary. This test is used for clinical purposes. It should not be regarded as investigational or  for research. This laboratory is certified under the Clinical Laboratory Improvement Amendments of 1988 (CLIA-88) as qualified to perform high complexity clinical laboratory testing.  The controls stained appropriately.   Basic metabolic panel     Status: Abnormal   Collection Time: 08/09/22  5:07 AM  Result Value Ref Range   Sodium 129 (L) 135 - 145 mmol/L   Potassium 6.9 (HH) 3.5 - 5.1 mmol/L    Comment: CRITICAL RESULT CALLED TO, READ BACK BY AND VERIFIED WITH PAUDEL, B RN @ [phone] 08/09/22. GILBERT, L    Chloride 96 (L) 98 - 111 mmol/L   CO2 21 (L) 22 - 32 mmol/L   Glucose, Bld 166 (H) 70 - 99 mg/dL    Comment: Glucose reference range applies only to samples taken after fasting for at least 8 hours.   BUN 34 (H) 6 - 20 mg/dL   Creatinine, Ser 4.00 (H) 0.61 - 1.24 mg/dL   Calcium 8.5 (L) 8.9 - 10.3 mg/dL   GFR,  Estimated 29 (L) >60 mL/min    Comment: (NOTE) Calculated using the CKD-EPI Creatinine Equation (2021)    Anion gap 12 5 - 15    Comment: Performed at [facility], [address], [place], [place] [address]  CBC     Status: Abnormal   Collection Time: 08/09/22  5:07 AM  Result Value Ref Range   WBC 24.1 (H) 4.0 - 10.5 K/uL   RBC 4.75 4.22 - 5.81 MIL/uL   Hemoglobin 14.3 13.0 - 17.0 g/dL   HCT 95.0 93.2 - 67.1 %   MCV 92.0 80.0 - 100.0 fL   MCH 30.1 26.0 - 34.0 pg   MCHC 32.7 30.0 - 36.0 g/dL   RDW 24.5 80.9 - 98.3 %   Platelets 379 150 - 400 K/uL   nRBC 0.0 0.0 - 0.2 %    Comment: Performed at [facility], [address] [place], [place] [address]  Magnesium     Status: None   Collection Time: 08/09/22  5:07 AM  Result Value Ref Range   Magnesium 1.9 1.7 - 2.4 mg/dL    Comment: Performed at [facility], [address], [place], [place] [address]  CBC     Status: Abnormal   Collection Time: 08/09/22  7:58 AM  Result Value Ref Range   WBC 22.2 (H) 4.0 - 10.5 K/uL   RBC 4.57 4.22 - 5.81 MIL/uL   Hemoglobin 13.6 13.0 - 17.0 g/dL   HCT 73.4 19.3 - 79.0 %   MCV 90.8 80.0 - 100.0 fL   MCH 29.8 26.0 - 34.0 pg   MCHC 32.8 30.0 - 36.0 g/dL   RDW 24.0 97.3 - 53.2 %   Platelets 401 (H) 150 - 400 K/uL   nRBC 0.0 0.0 - 0.2 %    Comment: Performed at [facility], [address], [place], [place] [address]  Basic metabolic panel     Status: Abnormal   Collection Time: 08/09/22  7:58 AM  Result Value Ref Range   Sodium 129 (L) 135 - 145 mmol/L   Potassium 6.1 (H) 3.5 - 5.1 mmol/L    Comment: REPEATED TO VERIFY NO VISIBLE HEMOLYSIS    Chloride 94 (L) 98 - 111 mmol/L   CO2 23 22 - 32 mmol/L   Glucose, Bld 197 (H) 70 - 99 mg/dL    Comment: Glucose reference range applies only to samples taken after fasting for at least 8 hours.  BUN 37 (H) 6 - 20 mg/dL   Creatinine, Ser 8.92 (H) 0.61 - 1.24 mg/dL    Calcium 8.7 (L) 8.9 - 10.3 mg/dL   GFR, Estimated 27 (L) >60 mL/min    Comment: (NOTE) Calculated using the CKD-EPI Creatinine Equation (2021)    Anion gap 12 5 - 15    Comment: Performed at [facility], [address], [place], [place] [address]  Potassium     Status: None   Collection Time: 08/10/22  4:37 AM  Result Value Ref Range   Potassium 3.8 3.5 - 5.1 mmol/L    Comment: DELTA CHECK NOTED Performed at [facility], [address] [place], [place] [address]   Creatinine, serum     Status: None   Collection Time: 08/10/22  4:37 AM  Result Value Ref Range   Creatinine, Ser 1.22 0.61 - 1.24 mg/dL    Comment: DELTA CHECK NOTED   GFR, Estimated >60 >60 mL/min    Comment: (NOTE) Calculated using the CKD-EPI Creatinine Equation (2021) Performed at [facility], [address], [place], [place] [address]   CBC     Status: Abnormal   Collection Time: 08/10/22  4:37 AM  Result Value Ref Range   WBC 12.0 (H) 4.0 - 10.5 K/uL   RBC 3.41 (L) 4.22 - 5.81 MIL/uL   Hemoglobin 10.3 (L) 13.0 - 17.0 g/dL   HCT 56.3 (L) 14.9 - 70.2 %   MCV 87.7 80.0 - 100.0 fL   MCH 30.2 26.0 - 34.0 pg   MCHC 34.4 30.0 - 36.0 g/dL   RDW 63.7 85.8 - 85.0 %   Platelets 278 150 - 400 K/uL   nRBC 0.0 0.0 - 0.2 %    Comment: Performed at [facility], [address], [place], [place] [address]    Imaging / Studies: No results found.  Medications / Allergies: per chart  Antibiotics: Anti-infectives (From admission, onward)    Start     Dose/Rate Route Frequency Ordered Stop   08/08/22 2200  cefoTEtan (CEFOTAN) 2 g in sodium chloride 0.9 % 100 mL IVPB        2 g 200 mL/hr over 30 Minutes Intravenous Every 12 hours 08/08/22 1540 08/08/22 2240   08/08/22 1400  neomycin (MYCIFRADIN) tablet 1,000 mg  Status:  Discontinued       See Hyperspace for full Linked Orders Report.   1,000 mg Oral 3 times per day 08/08/22 0547 08/08/22  0548   08/08/22 1400  metroNIDAZOLE (FLAGYL) tablet 1,000 mg  Status:  Discontinued       See Hyperspace for full Linked Orders Report.   1,000 mg Oral 3 times per day 08/08/22 0547 08/08/22 0548   08/08/22 0600  cefoTEtan (CEFOTAN) 2 g in sodium chloride 0.9 % 100 mL IVPB        2 g 200 mL/hr over 30 Minutes Intravenous On call to O.R. 08/08/22 0547 08/08/22 0941         Note: Portions of this report may have been transcribed using voice recognition software. Every effort was made to ensure accuracy; however, inadvertent computerized transcription errors may be present.   Any transcriptional errors that result from this process are unintentional.    Paul Sportsman, MD, FACS, MASCRS Esophageal, Gastrointestinal & Colorectal Surgery Robotic and Minimally Invasive Surgery  [facility] [address], [address] [place], [place] [address] [phone] Fax [phone]  Main  CONTACT INFORMATION:  Weekday (9AM-5PM): Call [facility] main office at [phone]  Weeknight (5PM-9AM) or Weekend/Holiday: Check [email or web address] (password " [email or web address]") for General Surgery [facility] coverage  (Please, do not use SecureChat as it is not reliable communication to reach operating surgeons for immediate patient care given surgeries/outpatient duties/clinic/cross-coverage/off post-call which would lead to a delay in care.  Epic staff messaging available for outptient concerns, but may not be answered for 48 hours or more).     08/10/2022  8:36 AM

## 2022-08-11 LAB — CREATININE, SERUM
Creatinine, Ser: 0.7 mg/dL (ref 0.61–1.24)
GFR, Estimated: >60 mL/min (ref >60)

## 2022-08-11 LAB — POTASSIUM: Potassium: 4 mmol/L (ref 3.5–5.1)

## 2022-08-11 MED ORDER — ZOLPIDEM TARTRATE 5 MG PO TABS
10.0000 mg | ORAL_TABLET | Freq: Every evening | ORAL | Status: DC | PRN
  Filled 2022-08-11: qty 2

## 2022-08-11 NOTE — Progress Notes (Signed)
Assessment & Plan: POD#3 - status post robotic colostomy takedown - Dr. Johney Maine  Tolerating diet, having BM's  Creatinine improved - 0.70  New onset ecchymosis lower abdominal wall - Hgb 10.3 yesterday - on Lovenox  Will monitor ecchymosis lower abdominal wall - appears to be spreading.  Will contniue Lovenox injections for now given patient's size, immobility.  Will check Hgb in AM 9/17.        Armandina Gemma, MD [facility] [place] practice Office: [phone]        Chief Complaint: Colostomy takedown  Subjective: Patient in bed, mild pain in abdominal wall.  Concerned about spreading ecchymosis abdominal wall.  Objective: Vital signs in last 24 hours: Temp:  [97.8 F (36.6 C)-99.3 F (37.4 C)] 97.8 F (36.6 C) (09/16 0517) Pulse Rate:  [78-108] 78 (09/16 0517) Resp:  [18] 18 (09/16 0517) BP: (134-145)/(83-93) 134/93 (09/16 0517) SpO2:  [96 %-100 %] 98 % (09/16 0517) Weight:  [122.9 kg] 122.9 kg (09/16 0500) Last BM Date : 08/10/22  Intake/Output from previous day: 09/15 0701 - 09/16 0700 In: 480 [P.O.:480] Out: 600 [Urine:600] Intake/Output this shift: Total I/O In: 240 [P.O.:240] Out: -   Physical Exam: HEENT - sclerae clear, mucous membranes moist Neck - soft Abdomen - soft, obese; wounds dry and intact; significant ecchymosis lower abdominal wall which appears to have increased since marking by nurses Neuro - alert & oriented, no focal deficits  Lab Results:  Recent Labs    08/09/22 0758 08/10/22 0437  WBC 22.2* 12.0*  HGB 13.6 10.3*  HCT 41.5 29.9*  PLT 401* 278   BMET Recent Labs    08/09/22 0507 08/09/22 0758 08/10/22 0437 08/11/22 0526  NA 129* 129*  --   --   K 6.9* 6.1* 3.8 4.0  CL 96* 94*  --   --   CO2 21* 23  --   --   GLUCOSE 166* 197*  --   --   BUN 34* 37*  --   --   CREATININE 2.64* 2.82* 1.22 0.70  CALCIUM 8.5* 8.7*  --   --    PT/INR No results for input(s): "LABPROT", "INR" in the last 72  hours. Comprehensive Metabolic Panel:    Component Value Date/Time   NA 129 (L) 08/09/2022 0758   NA 129 (L) 08/09/2022 0507   K 4.0 08/11/2022 0526   K 3.8 08/10/2022 0437   CL 94 (L) 08/09/2022 0758   CL 96 (L) 08/09/2022 0507   CO2 23 08/09/2022 0758   CO2 21 (L) 08/09/2022 0507   BUN 37 (H) 08/09/2022 0758   BUN 34 (H) 08/09/2022 0507   CREATININE 0.70 08/11/2022 0526   CREATININE 1.22 08/10/2022 0437   GLUCOSE 197 (H) 08/09/2022 0758   GLUCOSE 166 (H) 08/09/2022 0507   CALCIUM 8.7 (L) 08/09/2022 0758   CALCIUM 8.5 (L) 08/09/2022 0507   AST 35 04/18/2022 0422   AST 17 04/17/2022 0406   ALT 31 04/18/2022 0422   ALT 20 04/17/2022 0406   ALKPHOS 50 04/18/2022 0422   ALKPHOS 62 04/17/2022 0406   BILITOT 1.1 04/18/2022 0422   BILITOT 1.1 04/17/2022 0406   PROT 5.8 (L) 04/18/2022 0422   PROT 6.6 04/17/2022 0406   ALBUMIN 2.9 (L) 04/18/2022 0422   ALBUMIN 3.4 (L) 04/17/2022 0406    Studies/Results: No results found.    Armandina Gemma 08/11/2022   Patient ID: Paul Gilmore, male   DOB: 21-Jun-1976, 46 y.o.  MRN: [id number]

## 2022-08-11 NOTE — Plan of Care (Signed)
  Problem: Activity: Goal: Ability to tolerate increased activity will improve Outcome: Progressing   Problem: Bowel/Gastric: Goal: Gastrointestinal status for postoperative course will improve Outcome: Progressing   Problem: Nutritional: Goal: Will attain and maintain optimal nutritional status will improve Outcome: Progressing

## 2022-08-12 ENCOUNTER — Other Ambulatory Visit (HOSPITAL_COMMUNITY): Payer: Self-pay | Admitting: Surgery

## 2022-08-12 LAB — CREATININE, SERUM
Creatinine, Ser: 0.62 mg/dL (ref 0.61–1.24)
GFR, Estimated: >60 mL/min (ref >60)

## 2022-08-12 LAB — HEMOGLOBIN AND HEMATOCRIT, BLOOD
HCT: 29.5 % — ABNORMAL LOW (ref 39.0–52.0)
Hemoglobin: 9.9 g/dL — ABNORMAL LOW (ref 13.0–17.0)

## 2022-08-12 LAB — POTASSIUM: Potassium: 4.5 mmol/L (ref 3.5–5.1)

## 2022-08-12 MED ORDER — OXYCODONE HCL 5 MG PO TABS: 5.0000 mg | ORAL_TABLET | Freq: Four times a day (QID) | ORAL | 0 refills | Status: AC | PRN

## 2022-08-12 MED ORDER — METHOCARBAMOL 500 MG PO TABS: 1000.0000 mg | ORAL_TABLET | Freq: Three times a day (TID) | ORAL | 0 refills | Status: AC | PRN

## 2022-08-12 MED ORDER — OXYCODONE-ACETAMINOPHEN 5-325 MG PO TABS: 1.0000 | ORAL_TABLET | Freq: Four times a day (QID) | ORAL | 0 refills | Status: AC | PRN

## 2022-08-12 NOTE — Progress Notes (Signed)
Discharge instructions given verbal and written. All questions answered. Personal belongings with patient at the time of discharge. Patient in stable condition.

## 2022-08-12 NOTE — Progress Notes (Signed)
    Assessment & Plan: POD#4 - status post robotic colostomy takedown - Dr. Johney Maine             Tolerating diet, having BM's             Ecchymosis lower abdominal wall stable, Hgb 9.9 this AM   Doing well.  Dressing changed and wicks removed.  Plan discharge home today.        Armandina Gemma, MD [facility] [place] practice Office: [phone]        Chief Complaint: Ostomy closure  Subjective: Patient in bed, comfortable.  No new complaints.  Family at bedside.  Objective: Vital signs in last 24 hours: Temp:  [98.2 F (36.8 C)-99 F (37.2 C)] 98.2 F (36.8 C) (09/17 0424) Pulse Rate:  [79-90] 79 (09/17 0424) Resp:  [18] 18 (09/17 0424) BP: (147-154)/(83-88) 150/88 (09/17 0424) SpO2:  [98 %-100 %] 99 % (09/17 0424) Weight:  [712 kg] 124 kg (09/17 0500) Last BM Date : 08/11/22  Intake/Output from previous day: 09/16 0701 - 09/17 0700 In: 1600 [P.O.:1600] Out: -  Intake/Output this shift: No intake/output data recorded.  Physical Exam: HEENT - sclerae clear, mucous membranes moist Abdomen - soft, obese; wounds dry and intact; wicks removed and dry dressing placed Ext - no edema, non-tender Neuro - alert & oriented, no focal deficits  Lab Results:  Recent Labs    08/10/22 0437 08/12/22 0441  WBC 12.0*  --   HGB 10.3* 9.9*  HCT 29.9* 29.5*  PLT 278  --    BMET Recent Labs    08/11/22 0526 08/12/22 0441  K 4.0 4.5  CREATININE 0.70 0.62   PT/INR No results for input(s): "LABPROT", "INR" in the last 72 hours. Comprehensive Metabolic Panel:    Component Value Date/Time   NA 129 (L) 08/09/2022 0758   NA 129 (L) 08/09/2022 0507   K 4.5 08/12/2022 0441   K 4.0 08/11/2022 0526   CL 94 (L) 08/09/2022 0758   CL 96 (L) 08/09/2022 0507   CO2 23 08/09/2022 0758   CO2 21 (L) 08/09/2022 0507   BUN 37 (H) 08/09/2022 0758   BUN 34 (H) 08/09/2022 0507   CREATININE 0.62 08/12/2022 0441   CREATININE 0.70 08/11/2022 0526   GLUCOSE 197 (H)  08/09/2022 0758   GLUCOSE 166 (H) 08/09/2022 0507   CALCIUM 8.7 (L) 08/09/2022 0758   CALCIUM 8.5 (L) 08/09/2022 0507   AST 35 04/18/2022 0422   AST 17 04/17/2022 0406   ALT 31 04/18/2022 0422   ALT 20 04/17/2022 0406   ALKPHOS 50 04/18/2022 0422   ALKPHOS 62 04/17/2022 0406   BILITOT 1.1 04/18/2022 0422   BILITOT 1.1 04/17/2022 0406   PROT 5.8 (L) 04/18/2022 0422   PROT 6.6 04/17/2022 0406   ALBUMIN 2.9 (L) 04/18/2022 0422   ALBUMIN 3.4 (L) 04/17/2022 0406    Studies/Results: No results found.    Armandina Gemma 08/12/2022   Patient ID: Paul Gilmore, male   DOB: 06-20-1976, 46 y.o.   MRN: [id number]

## 2022-08-13 ENCOUNTER — Encounter: Payer: Self-pay | Admitting: Surgery

## 2022-08-13 DIAGNOSIS — N182 Chronic kidney disease, stage 2 (mild): Secondary | ICD-10-CM | POA: Insufficient documentation

## 2022-08-13 NOTE — Discharge Summary (Signed)
Physician Discharge Summary    Patient ID: Paul Gilmore MRN: [id number] DOB/AGE: [birth year]  46 y.o.  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 Active Problems:   Obesity, Class III, BMI 40-49.9 (morbid obesity) ([place])   Essential hypertension   Partial obstruction of colon ([place])   Colonic diverticular disease   5 Days Post-Op  08/08/2022  POST-OPERATIVE DIAGNOSIS:   Diverticulitis s/p [name]  SURGERY:  08/08/2022  Procedure(s): XI ROBOTIC ASSISTED COLOSTOMY TAKEDOWN LYSIS OF ADHESIONS RIGID PROCTOSCOPY CYSTOSCOPY with FIREFLY INJECTION  SURGEON:    Surgeon(s): [name], MD Ceasar Mons, MD Leighton Ruff, MD  Consults: Pharmacy, Case Management / Social Work, and Urology  Hospital Course:   The patient underwent the surgery above.  He did have rising creatinine and potassium that rapidly improved with hydration and repeat labs.  Postoperatively, the patient gradually mobilized and advanced to a solid diet.  Pain and other symptoms were treated aggressively.    By the time of discharge, the patient was walking well the hallways, eating food, having flatus.  Pain was well-controlled on an oral medications.  Based on meeting discharge criteria and continuing to recover, I felt it was safe for the patient to be discharged from the hospital to further recover with close followup. Postoperative recommendations were discussed in detail.  They are written as well.  Discharged Condition: good  Discharge Exam: Blood pressure (!) 150/88, pulse 79, temperature 98.2 F (36.8 C), temperature source Oral,  resp. rate 18, height 5\' 11"  (1.803 m), weight 124 kg, SpO2 99 %.  General: Pt awake/alert/oriented x4 in No acute distress Eyes: PERRL, normal EOM.  Sclera clear.  No icterus Neuro: CN II-XII intact w/o focal sensory/motor deficits. Lymph: No head/neck/groin lymphadenopathy Psych:  No delerium/psychosis/paranoia HENT: Normocephalic, Mucus membranes moist.  No thrush Neck: Supple, No tracheal deviation Chest:  No chest wall pain w good excursion CV:  Pulses intact.  Regular rhythm MS: Normal AROM mjr joints.  No obvious deformity Abdomen: Soft.  Mildly distended.  Mildly tender at incisions only.  No evidence of peritonitis.  No incarcerated hernias. Ext:  SCDs BLE.  No mjr edema.  No cyanosis Skin: No petechiae / purpura   Disposition:    Follow-up Information     [name], MD. Schedule an appointment as soon as possible for a visit in 3 week(s).   Specialties: General Surgery, Colon and Rectal Surgery Why: To follow up after your operation Contact information: [place] [address] [phone]                 Discharge disposition: 01-Home or Self Care       Discharge Instructions     Call MD for:   Complete by: As directed    FEVER > 101.5 F  (temperatures < 101.5 F are not significant)   Call MD for:  extreme fatigue   Complete by: As directed    Call MD for:  persistant dizziness or light-headedness   Complete by: As directed    Call MD for:  persistant nausea and vomiting   Complete by: As directed    Call MD for:  redness,  tenderness, or signs of infection (pain, swelling, redness, odor or green/yellow discharge around incision site)   Complete by: As directed    Call MD for:  severe uncontrolled pain   Complete by: As directed    Diet - low sodium heart healthy   Complete by: As directed    Start with a bland diet such as soups, liquids, starchy foods, low fat foods, etc. the first few days at home. Gradually  advance to a solid, low-fat, high fiber diet by the end of the first week at home.   Add a fiber supplement to your diet (Metamucil, etc) If you feel full, bloated, or constipated, stay on a full liquid or pureed/blenderized diet for a few days until you feel better and are no longer constipated.   Discharge instructions   Complete by: As directed    See Discharge Instructions If you are not getting better after two weeks or are noticing you are getting worse, contact our office [phone] for further advice.  We may need to adjust your medications, re-evaluate you in the office, send you to the emergency room, or see what other things we can do to help. The clinic staff is available to answer your questions during regular business hours (8:30am-5pm).  Please don't hesitate to call and ask to speak to one of our nurses for clinical concerns.    A surgeon from [facility] is always on call at the hospitals 24 hours/day If you have a medical emergency, go to the nearest emergency room or call 911.   Discharge wound care:   Complete by: As directed    It is good for closed incisions and even open wounds to be washed every day.  Shower every day.  Short baths are fine.  Wash the incisions and wounds clean with soap & water.    You may leave closed incisions open to air if it is dry.   You may cover the incision with clean gauze & replace it after your daily shower for comfort.  TEGADERM:  You have clear gauze band-aid dressings over your closed incision(s).  Remove the dressings 3 days after surgery.   Discharge wound care:   Complete by: As directed    Change dressing to left lower abdominal wound as needed for drainage.  May shower.   Driving Restrictions   Complete by: As directed    You may drive when: - you are no longer taking narcotic prescription pain medication - you can comfortably wear a seatbelt - you can safely make sudden turns/stops without pain.   Increase  activity slowly   Complete by: As directed    Start light daily activities --- self-care, walking, climbing stairs- beginning the day after surgery.  Gradually increase activities as tolerated.  Control your pain to be active.  Stop when you are tired.  Ideally, walk several times a day, eventually an hour a day.   Most people are back to most day-to-day activities in a few weeks.  It takes 4-6 weeks to get back to unrestricted, intense activity. If you can walk 30 minutes without difficulty, it is safe to try more intense activity such as jogging, treadmill, bicycling, low-impact aerobics, swimming, etc. Save the most intensive and strenuous activity for last (Usually 4-8 weeks after surgery) such as sit-ups, heavy lifting, contact sports, etc.  Refrain from any intense heavy lifting or straining until you are off narcotics for pain control.  You will have off days, but  things should improve week-by-week. DO NOT PUSH THROUGH PAIN.  Let pain be your guide: If it hurts to do something, don't do it.   Increase activity slowly   Complete by: As directed    Lifting restrictions   Complete by: As directed    If you can walk 30 minutes without difficulty, it is safe to try more intense activity such as jogging, treadmill, bicycling, low-impact aerobics, swimming, etc. Save the most intensive and strenuous activity for last (Usually 4-8 weeks after surgery) such as sit-ups, heavy lifting, contact sports, etc.   Refrain from any intense heavy lifting or straining until you are off narcotics for pain control.  You will have off days, but things should improve week-by-week. DO NOT PUSH THROUGH PAIN.  Let pain be your guide: If it hurts to do something, don't do it.  Pain is your body warning you to avoid that activity for another week until the pain goes down.   May shower / Bathe   Complete by: As directed    May walk up steps   Complete by: As directed    Remove dressing in 72 hours   Complete by: As  directed    Make sure all dressings are removed by the third day after surgery.  Leave incisions open to air.  OK to cover incisions with gauze or bandages as desired   Sexual Activity Restrictions   Complete by: As directed    You may have sexual intercourse when it is comfortable. If it hurts to do something, stop.       Allergies as of 08/12/2022   No Known Allergies      Medication List     TAKE these medications    albuterol 108 (90 Base) MCG/ACT inhaler Commonly known as: VENTOLIN HFA Inhale 1 puff into the lungs every 6 (six) hours as needed for wheezing or shortness of breath.   amLODipine 10 MG tablet Commonly known as: NORVASC Take 10 mg by mouth daily.   methocarbamol 500 MG tablet Commonly known as: ROBAXIN Take 2 tablets (1,000 mg total) by mouth every 8 (eight) hours as needed (use for muscle cramps/pain). What changed:  medication strength when to take this reasons to take this   oxyCODONE 5 MG immediate release tablet Commonly known as: Oxy IR/ROXICODONE Take 1 tablet (5 mg total) by mouth every 6 (six) hours as needed for moderate pain. What changed: when to take this               Discharge Care Instructions  (From admission, onward)           Start     Ordered   08/12/22 0000  Discharge wound care:       Comments: Change dressing to left lower abdominal wound as needed for drainage.  May shower.   08/12/22 0924   08/08/22 0000  Discharge wound care:       Comments: It is good for closed incisions and even open wounds to be washed every day.  Shower every day.  Short baths are fine.  Wash the incisions and wounds clean with soap & water.    You may leave closed incisions open to air if it is dry.   You may cover the incision with clean gauze & replace it after your daily shower for comfort.  TEGADERM:  You have clear gauze band-aid dressings over your closed incision(s).  Remove the dressings 3 days after surgery.   08/08/22 0750  Significant Diagnostic Studies:  Results for orders placed or performed during the hospital encounter of 08/08/22 (from the past 72 hour(s))  Potassium     Status: None   Collection Time: 08/11/22  5:26 AM  Result Value Ref Range   Potassium 4.0 3.5 - 5.1 mmol/L    Comment: Performed at [facility], [address], [place], [place], [place] [address]  Creatinine, serum     Status: None   Collection Time: 08/11/22  5:26 AM  Result Value Ref Range   Creatinine, Ser 0.70 0.61 - 1.24 mg/dL   GFR, Estimated >77 >11 mL/min    Comment: (NOTE) Calculated using the CKD-EPI Creatinine Equation (2021) Performed at [facility], [address], [place], [place] [address]   Potassium     Status: None   Collection Time: 08/12/22  4:41 AM  Result Value Ref Range   Potassium 4.5 3.5 - 5.1 mmol/L    Comment: Performed at [facility], [address], [place], [place] [address]  Creatinine, serum     Status: None   Collection Time: 08/12/22  4:41 AM  Result Value Ref Range   Creatinine, Ser 0.62 0.61 - 1.24 mg/dL   GFR, Estimated >83 >29 mL/min    Comment: (NOTE) Calculated using the CKD-EPI Creatinine Equation (2021) Performed at [facility], [address], [place], [place] [address]   Hemoglobin and hematocrit, blood     Status: Abnormal   Collection Time: 08/12/22  4:41 AM  Result Value Ref Range   Hemoglobin 9.9 (L) 13.0 - 17.0 g/dL   HCT 06.0 (L) 04.5 - 99.7 %    Comment: Performed at [facility], [address], [place], [place] [address]    No results found.  Past Medical History:  Diagnosis Date   Allergy    Anxiety    Asthma    Bowel obstruction ([facility])    Diverticulitis of intestine with abscess 03/22/2018   Diverticulitis of large intestine with perforation and abscess 02/2018   GERD (gastroesophageal reflux disease)    HTN (hypertension)     Past Surgical History:   Procedure Laterality Date   COLOSTOMY  04/2022   LYSIS OF ADHESION N/A 08/08/2022   Procedure: LYSIS OF ADHESIONS;  Surgeon: Karie Soda, MD;  Location: [facility];  Service: General;  Laterality: N/A;   PROCTOSCOPY N/A 08/08/2022   Procedure: RIGID PROCTOSCOPY;  Surgeon: Karie Soda, MD;  Location: [facility];  Service: General;  Laterality: N/A;   XI ROBOTIC ASSISTED COLOSTOMY TAKEDOWN N/A 08/08/2022   Procedure: XI ROBOTIC ASSISTED COLOSTOMY TAKEDOWN;  Surgeon: Karie Soda, MD;  Location: [facility];  Service: General;  Laterality: N/A;    Social History   Socioeconomic History   Marital status: Married    Spouse name: Not on file   Number of children: 3   Years of education: Not on file   Highest education level: Not on file  Occupational History   Occupation: Alarm install tech  Tobacco Use   Smoking status: Never   Smokeless tobacco: Former    Types: Chew    Quit date: 06/11/2014  Vaping Use   Vaping Use: Never used  Substance and Sexual Activity   Alcohol use: Yes    Comment: moderate. 2-3 days a week   Drug use: Yes    Types: Marijuana   Sexual activity: Yes  Other Topics Concern   Not on file  Social History Narrative   Not on  file   Social Determinants of Health   Financial Resource Strain: Not on file  Food Insecurity: No Food Insecurity (08/12/2022)   Hunger Vital Sign    Worried About Running Out of Food in the Last Year: Never true    Ran Out of Food in the Last Year: Never true  Transportation Needs: No Transportation Needs (08/12/2022)   PRAPARE - Hydrologist (Medical): No    Lack of Transportation (Non-Medical): No  Physical Activity: Not on file  Stress: Not on file  Social Connections: Not on file  Intimate Partner Violence: Not At Risk (08/12/2022)   Humiliation, Afraid, Rape, and Kick questionnaire    Fear of Current or Ex-Partner: No    Emotionally Abused: No    Physically Abused: No    Sexually Abused: No    Family  History  Problem Relation Age of Onset   COPD Mother    Diverticulitis Mother    Alcohol abuse Father    Hypertension Father    Throat cancer Father    Esophageal cancer Father    Colon cancer Neg Hx    Rectal cancer Neg Hx    Stomach cancer Neg Hx     No current facility-administered medications for this encounter.   Current Outpatient Medications  Medication Sig Dispense Refill   amLODipine (NORVASC) 10 MG tablet Take 10 mg by mouth daily.     albuterol (PROVENTIL HFA;VENTOLIN HFA) 108 (90 Base) MCG/ACT inhaler Inhale 1 puff into the lungs every 6 (six) hours as needed for wheezing or shortness of breath.     methocarbamol (ROBAXIN) 500 MG tablet Take 2 tablets (1,000 mg total) by mouth every 8 (eight) hours as needed (use for muscle cramps/pain). 30 tablet 0   oxyCODONE (OXY IR/ROXICODONE) 5 MG immediate release tablet Take 1 tablet (5 mg total) by mouth every 6 (six) hours as needed for moderate pain. 24 tablet 0   oxyCODONE-acetaminophen (PERCOCET) 5-325 MG tablet Take 1 tablet by mouth every 6 (six) hours as needed for severe pain or moderate pain. 24 tablet 0     No Known Allergies  Signed:   Adin Hector, MD, FACS, MASCRS Esophageal, Gastrointestinal & Colorectal Surgery Robotic and Minimally Invasive Surgery  [facility] [place] [address] [place], [place] [address] [phone] Fax [phone] Main  CONTACT INFORMATION:  Weekday (9AM-5PM): Call [facility] main office at [phone]  Weeknight (5PM-9AM) or Weekend/Holiday: Check [email or web address] (password " [email or web address]") for General Surgery [facility] coverage  (Please, do not use SecureChat as it is not reliable communication to reach operating surgeons for immediate patient care given surgeries/outpatient duties/clinic/cross-coverage/off post-call which would lead to a delay in care.  Epic staff messaging available for outptient concerns, but may not be answered for 48  hours or more).     08/13/2022, 1:33 PM

## 2022-08-15 ENCOUNTER — Other Ambulatory Visit (HOSPITAL_COMMUNITY): Payer: Self-pay

## 2023-03-13 ENCOUNTER — Encounter: Payer: Self-pay | Admitting: *Deleted

## 2024-05-25 IMAGING — CT CT ABD-PELV W/ CM
2 of 4 series · 15 of 46 positions shown, 17 images · IV contrast (agent unspecified)
Comparison: CT abdomen pelvis dated 04/16/2022.

CLINICAL DATA: Abdominal pain.

EXAM:
CT ABDOMEN AND PELVIS WITH CONTRAST
TECHNIQUE: Multidetector CT imaging of the abdomen and pelvis was performed
using the standard protocol following bolus administration of
intravenous contrast.

[Series 2: axial st · axial · 0.90mm/px · z∈[-812,-377]mm · 12 of 101 slices shown, 14 images]
[im 7/101  soft-tissue]
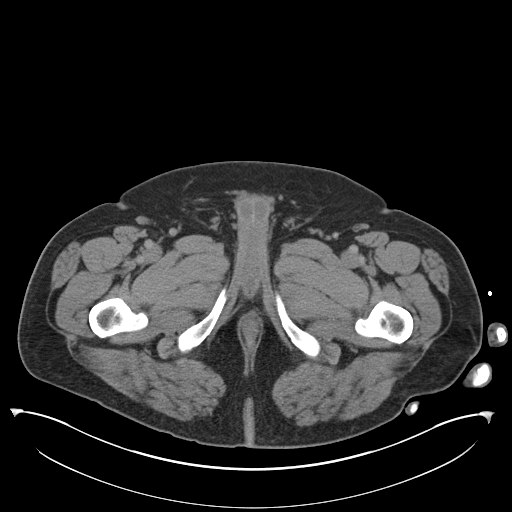
[im 7/101  bone]
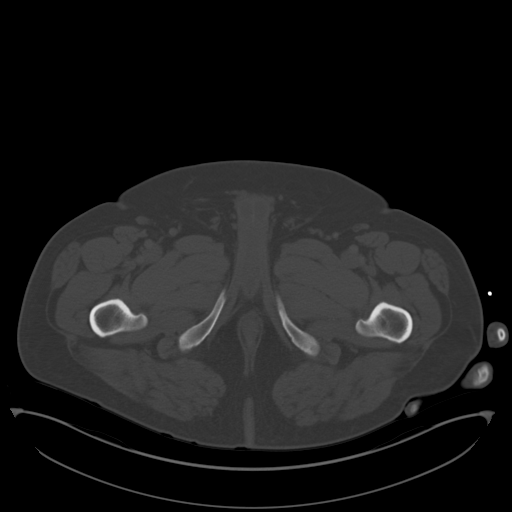
[im 13/101  soft-tissue]
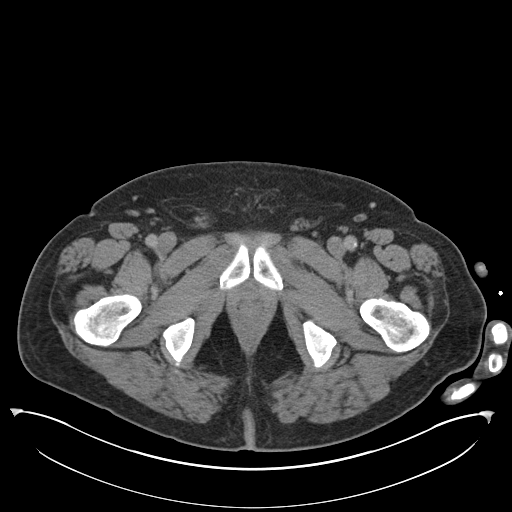
[im 26/101  soft-tissue]
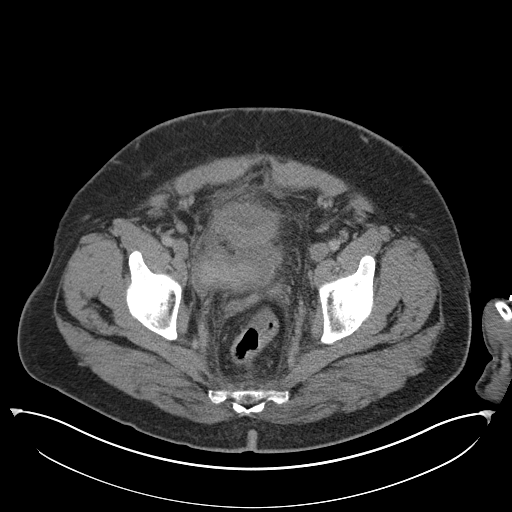
[im 32/101  soft-tissue]
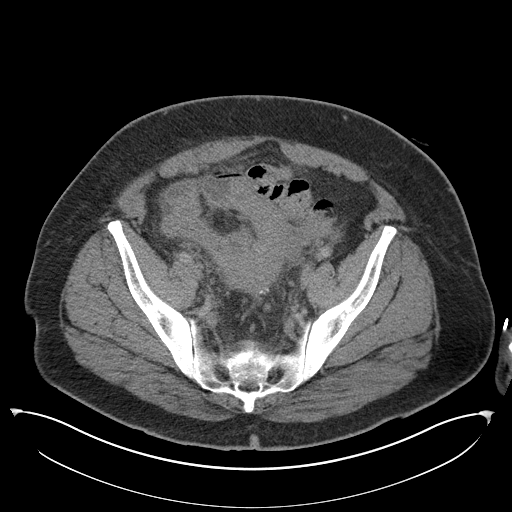
[im 38/101  soft-tissue]
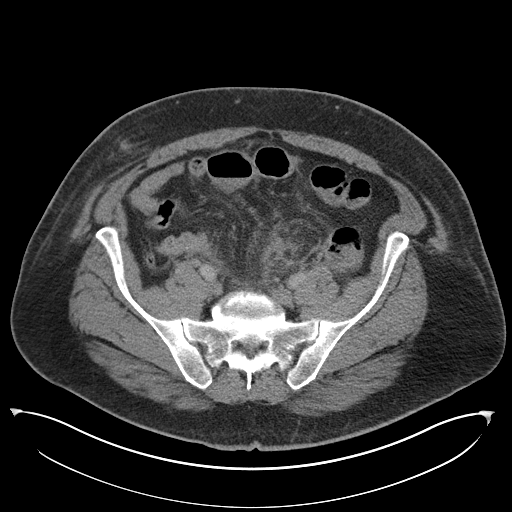
[im 44/101  soft-tissue]
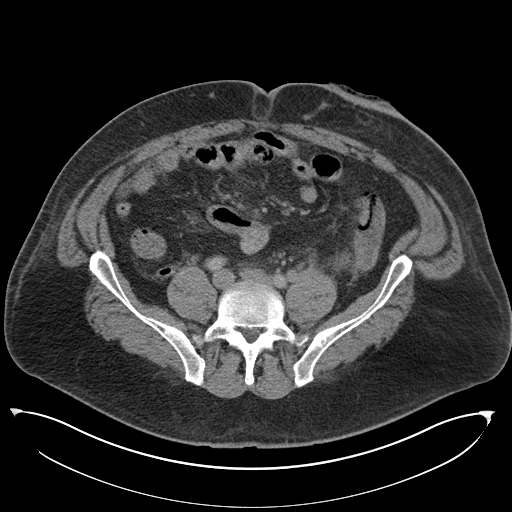
[im 57/101  soft-tissue]
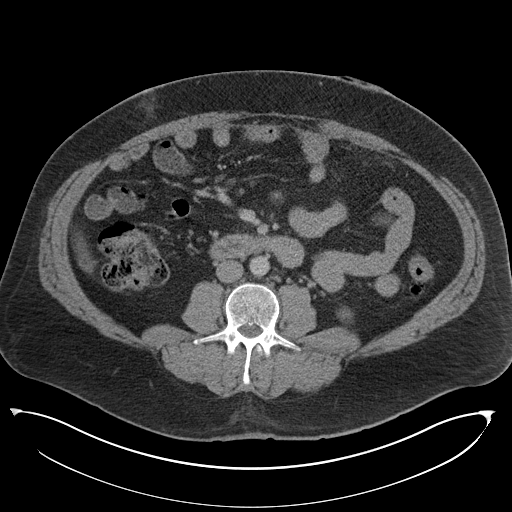
[im 63/101  soft-tissue]
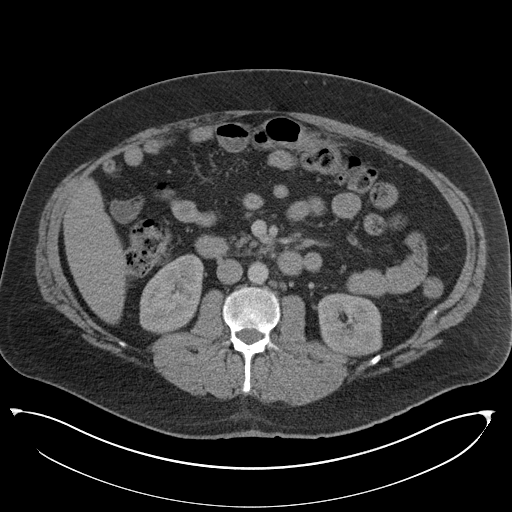
[im 69/101  soft-tissue]
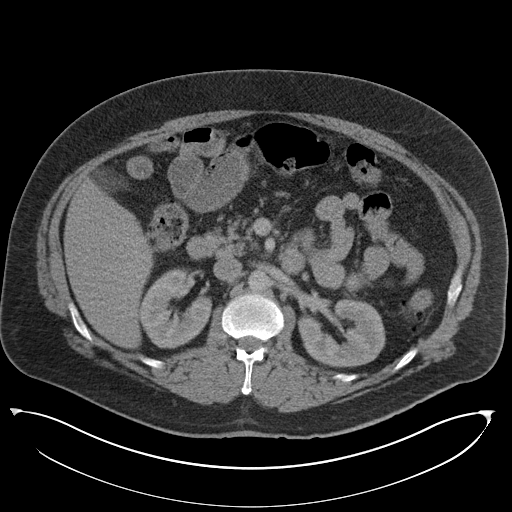
[im 69/101  bone]
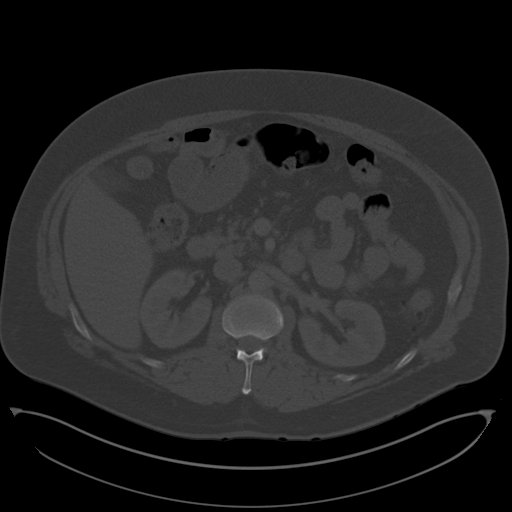
[im 76/101  soft-tissue]
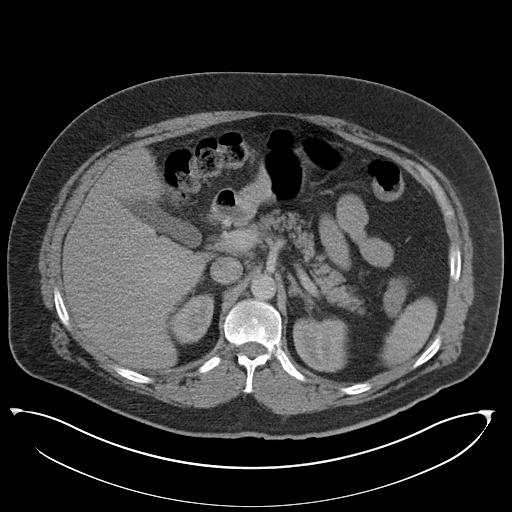
[im 88/101  soft-tissue]
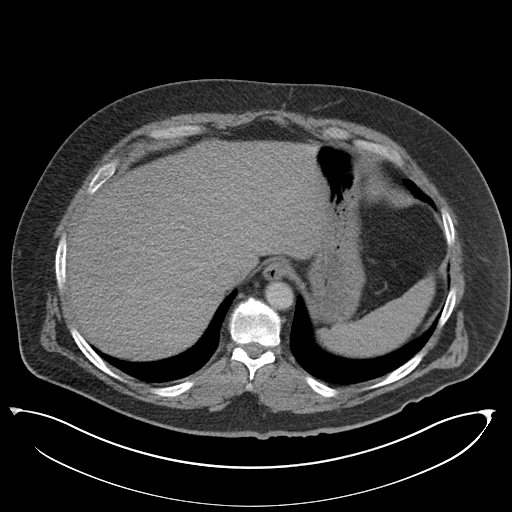
[im 94/101  soft-tissue]
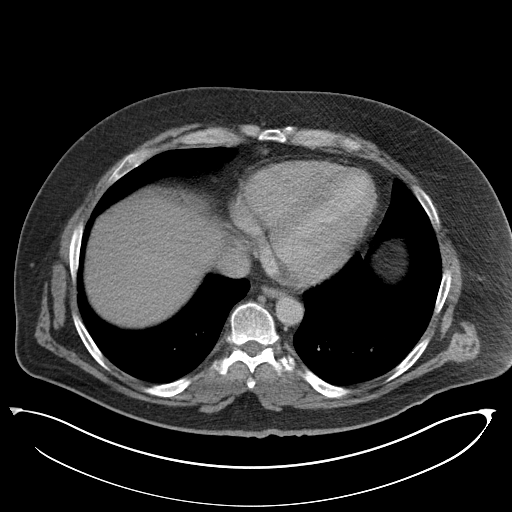

[Series 4: coronal st · coronal · 0.94mm/px · 3 of 174 slices shown]
[im 58/174  soft-tissue]
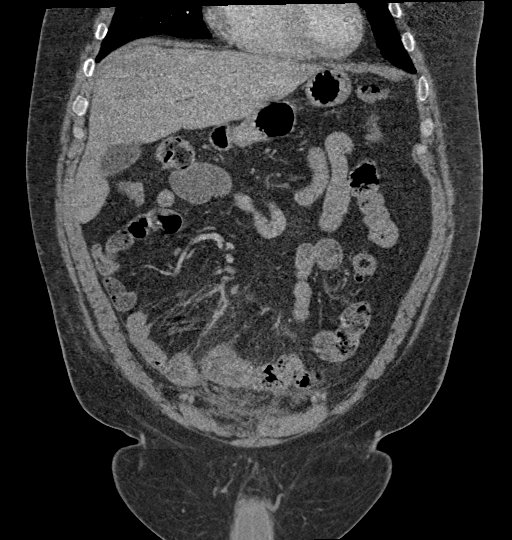
[im 77/174  soft-tissue]
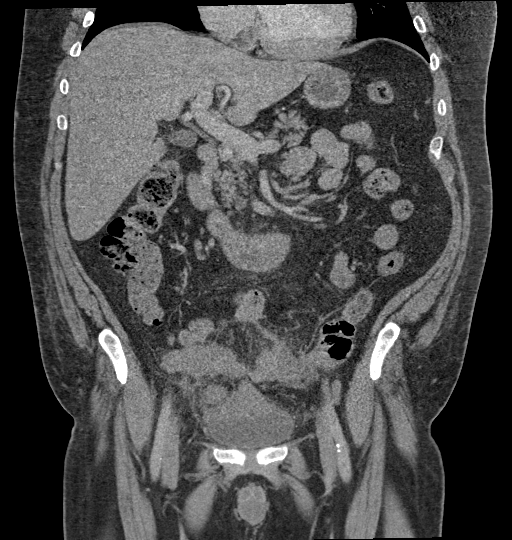
[im 97/174  soft-tissue]
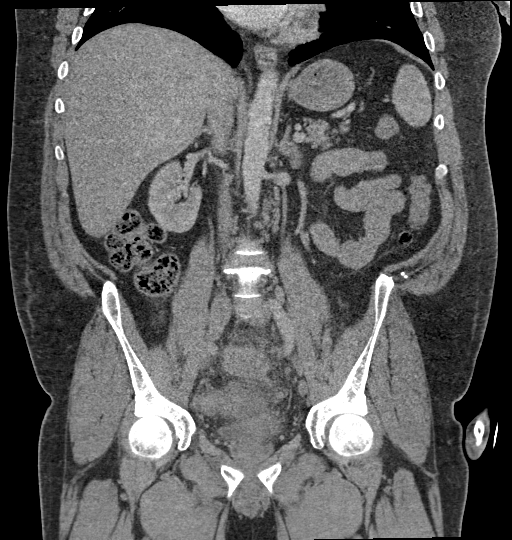

[15 of 46 positions shown; findings below may reference images not displayed]

RADIATION DOSE REDUCTION: This exam was performed according to the
departmental dose-optimization program which includes automated
exposure control, adjustment of the mA and/or kV according to
patient size and/or use of iterative reconstruction technique.

CONTRAST:  100mL OMNIPAQUE IOHEXOL 300 MG/ML  SOLN
FINDINGS: Evaluation is limited in the absence of oral contrast.

Lower chest: The visualized lung bases are clear.

No intra-abdominal free air or free fluid.

Hepatobiliary: Probable mild fatty liver. No intrahepatic biliary
dilatation. The gallbladder is unremarkable.

Pancreas: Unremarkable. No pancreatic ductal dilatation or
surrounding inflammatory changes.

Spleen: Normal in size without focal abnormality.

Adrenals/Urinary Tract: The adrenal glands are unremarkable. There
is no hydronephrosis on either side. There is thickened appearance
of the posterior wall and dome of the bladder with surrounding
inflammatory changes. There is tethering of the bladder dome to loop
of small bowel in the lower abdomen.

There is a 7.2 x 4.0 cm somewhat loculated inflammatory collection
in the pelvis superior and posterior to the bladder which abuts the
posterior bladder wall. This may represent an area of fat
necrosis/infarct versus developing abscess. Underlying traumatic
bladder injury and urinoma is less likely.

Stomach/Bowel: Evaluation of the bowel is limited in the absence of
oral contrast. Postsurgical changes of distal colon resection with a
left lower quadrant colostomy. Multiple descending colon diverticula
without active inflammatory changes. There is inflammatory changes
surrounding several loops of small bowel in the lower abdomen and
pelvis, likely postoperative. Tethering of the loop of small bowel
to the bladder dome with an inflammatory collection within the
pelvis posterior and superior to the bladder as above. No evidence
of bowel obstruction at this time. The appendix is normal.

Vascular/Lymphatic: Mild atherosclerotic calcification of the
abdominal aorta. The IVC is unremarkable. No portal venous gas.
There is no adenopathy.

Reproductive: The prostate and seminal vesicles are grossly
unremarkable. No pelvic mass.

Other: Small fat containing umbilical hernia. Linear bandlike track
in the subcutaneous soft tissues of the right lower quadrant, likely
sequela of prior instrumentation.

Musculoskeletal: Degenerative changes of the spine. Similar
appearance of compression fracture of the superior endplate of T12.
No acute osseous pathology.
IMPRESSION: 1. Postsurgical changes of distal colon resection with a left lower
quadrant colostomy.
2. Inflammatory changes within the pelvis with a somewhat loculated
inflammatory collection superior and posterior to the bladder
suspicious for an area of fat necrosis versus developing abscess.
There is tethering of a loop of small bowel to this inflammatory
collection and bladder dome consistent with developing adhesion. No
bowel obstruction, drainable fluid collection or abscess at this
time.
3. Aortic Atherosclerosis ([id number]).
